# Patient Record
Sex: Female | Born: 1955 | Race: Black or African American | Hispanic: No
Health system: Southern US, Community
[De-identification: ages and names within clinical notes are randomized; demographics above are authoritative.]

## PROBLEM LIST (undated history)

## (undated) VITALS — BP 114/78 | HR 69 | Temp 98.7°F | Resp 15 | Ht 69.75 in | Wt 202.0 lb

## (undated) DIAGNOSIS — M1990 Unspecified osteoarthritis, unspecified site: Secondary | ICD-10-CM

## (undated) DIAGNOSIS — F32A Depression, unspecified: Secondary | ICD-10-CM

## (undated) DIAGNOSIS — F319 Bipolar disorder, unspecified: Secondary | ICD-10-CM

## (undated) DIAGNOSIS — F329 Major depressive disorder, single episode, unspecified: Secondary | ICD-10-CM

## (undated) DIAGNOSIS — F259 Schizoaffective disorder, unspecified: Secondary | ICD-10-CM

## (undated) DIAGNOSIS — F14159 Cocaine abuse with cocaine-induced psychotic disorder, unspecified: Secondary | ICD-10-CM

## (undated) DIAGNOSIS — F419 Anxiety disorder, unspecified: Secondary | ICD-10-CM

## (undated) DIAGNOSIS — F1010 Alcohol abuse, uncomplicated: Secondary | ICD-10-CM

## (undated) HISTORY — PX: FOOT ARTHRODESIS: SHX1655

## (undated) HISTORY — PX: ABDOMINAL HYSTERECTOMY: SHX81

## (undated) HISTORY — PX: ELEVATION OF DEPRESSED SKULL FRACTURE: SUR431

## (undated) HISTORY — PX: APPENDECTOMY: SHX54

---

## 1999-08-22 ENCOUNTER — Emergency Department (HOSPITAL_COMMUNITY): Admission: EM | Admit: 1999-08-22 | Discharge: 1999-08-22 | Payer: Self-pay | Admitting: Emergency Medicine

## 1999-08-22 ENCOUNTER — Encounter: Payer: Self-pay | Admitting: Emergency Medicine

## 1999-10-24 ENCOUNTER — Emergency Department (HOSPITAL_COMMUNITY): Admission: EM | Admit: 1999-10-24 | Discharge: 1999-10-24 | Payer: Self-pay | Admitting: Emergency Medicine

## 2001-06-26 ENCOUNTER — Emergency Department (HOSPITAL_COMMUNITY): Admission: EM | Admit: 2001-06-26 | Discharge: 2001-06-26 | Payer: Self-pay | Admitting: Emergency Medicine

## 2003-10-17 HISTORY — PX: FRACTURE SURGERY: SHX138

## 2005-01-19 ENCOUNTER — Ambulatory Visit: Payer: Self-pay | Admitting: Physical Medicine & Rehabilitation

## 2005-01-19 ENCOUNTER — Inpatient Hospital Stay (HOSPITAL_COMMUNITY): Admission: EM | Admit: 2005-01-19 | Discharge: 2005-02-21 | Payer: Self-pay

## 2005-04-03 ENCOUNTER — Ambulatory Visit (HOSPITAL_COMMUNITY): Payer: Self-pay | Admitting: Psychiatry

## 2005-04-20 ENCOUNTER — Encounter: Admission: RE | Admit: 2005-04-20 | Discharge: 2005-04-20 | Payer: Self-pay | Admitting: Psychiatry

## 2005-05-05 ENCOUNTER — Ambulatory Visit (HOSPITAL_COMMUNITY): Payer: Self-pay | Admitting: Psychiatry

## 2005-06-05 ENCOUNTER — Ambulatory Visit: Payer: Self-pay | Admitting: Internal Medicine

## 2005-07-12 ENCOUNTER — Ambulatory Visit: Payer: Self-pay | Admitting: Internal Medicine

## 2005-07-20 ENCOUNTER — Emergency Department (HOSPITAL_COMMUNITY): Admission: EM | Admit: 2005-07-20 | Discharge: 2005-07-21 | Payer: Self-pay | Admitting: Emergency Medicine

## 2005-11-20 ENCOUNTER — Emergency Department (HOSPITAL_COMMUNITY): Admission: EM | Admit: 2005-11-20 | Discharge: 2005-11-20 | Payer: Self-pay | Admitting: Emergency Medicine

## 2006-09-18 ENCOUNTER — Ambulatory Visit: Payer: Self-pay | Admitting: Internal Medicine

## 2006-12-13 ENCOUNTER — Ambulatory Visit: Payer: Self-pay | Admitting: Internal Medicine

## 2007-10-07 ENCOUNTER — Emergency Department (HOSPITAL_COMMUNITY): Admission: EM | Admit: 2007-10-07 | Discharge: 2007-10-07 | Payer: Self-pay | Admitting: Emergency Medicine

## 2007-11-12 ENCOUNTER — Ambulatory Visit: Payer: Self-pay | Admitting: Internal Medicine

## 2007-11-24 ENCOUNTER — Emergency Department (HOSPITAL_COMMUNITY): Admission: EM | Admit: 2007-11-24 | Discharge: 2007-11-24 | Payer: Self-pay | Admitting: Emergency Medicine

## 2007-12-15 ENCOUNTER — Emergency Department (HOSPITAL_COMMUNITY): Admission: EM | Admit: 2007-12-15 | Discharge: 2007-12-15 | Payer: Self-pay | Admitting: Emergency Medicine

## 2008-01-02 ENCOUNTER — Emergency Department (HOSPITAL_COMMUNITY): Admission: EM | Admit: 2008-01-02 | Discharge: 2008-01-02 | Payer: Self-pay | Admitting: Emergency Medicine

## 2008-01-14 ENCOUNTER — Emergency Department (HOSPITAL_COMMUNITY): Admission: EM | Admit: 2008-01-14 | Discharge: 2008-01-14 | Payer: Self-pay | Admitting: Emergency Medicine

## 2008-02-19 ENCOUNTER — Emergency Department (HOSPITAL_COMMUNITY): Admission: EM | Admit: 2008-02-19 | Discharge: 2008-02-19 | Payer: Self-pay | Admitting: Emergency Medicine

## 2008-03-27 ENCOUNTER — Ambulatory Visit: Payer: Self-pay | Admitting: Internal Medicine

## 2008-05-01 ENCOUNTER — Emergency Department (HOSPITAL_COMMUNITY): Admission: EM | Admit: 2008-05-01 | Discharge: 2008-05-02 | Payer: Self-pay | Admitting: Emergency Medicine

## 2008-08-23 ENCOUNTER — Observation Stay (HOSPITAL_COMMUNITY): Admission: EM | Admit: 2008-08-23 | Discharge: 2008-08-24 | Payer: Self-pay | Admitting: Emergency Medicine

## 2008-08-23 ENCOUNTER — Ambulatory Visit: Payer: Self-pay | Admitting: Internal Medicine

## 2008-09-02 ENCOUNTER — Encounter: Payer: Self-pay | Admitting: Internal Medicine

## 2008-09-02 ENCOUNTER — Ambulatory Visit: Payer: Self-pay | Admitting: Internal Medicine

## 2008-09-02 LAB — CONVERTED CEMR LAB: GC Probe Amp, Genital: NEGATIVE

## 2008-10-18 ENCOUNTER — Emergency Department (HOSPITAL_COMMUNITY): Admission: EM | Admit: 2008-10-18 | Discharge: 2008-10-18 | Payer: Self-pay | Admitting: Emergency Medicine

## 2008-10-23 ENCOUNTER — Ambulatory Visit: Payer: Self-pay | Admitting: Internal Medicine

## 2008-10-23 ENCOUNTER — Encounter (INDEPENDENT_AMBULATORY_CARE_PROVIDER_SITE_OTHER): Payer: Self-pay | Admitting: Internal Medicine

## 2008-10-23 LAB — CONVERTED CEMR LAB: TSH: 0.495 microintl units/mL (ref 0.350–4.50)

## 2008-10-31 ENCOUNTER — Ambulatory Visit: Payer: Self-pay | Admitting: Internal Medicine

## 2008-10-31 ENCOUNTER — Inpatient Hospital Stay (HOSPITAL_COMMUNITY): Admission: EM | Admit: 2008-10-31 | Discharge: 2008-11-10 | Payer: Self-pay | Admitting: Emergency Medicine

## 2008-11-02 ENCOUNTER — Ambulatory Visit: Payer: Self-pay | Admitting: Infectious Diseases

## 2008-11-03 ENCOUNTER — Encounter (INDEPENDENT_AMBULATORY_CARE_PROVIDER_SITE_OTHER): Payer: Self-pay | Admitting: Interventional Radiology

## 2008-11-03 ENCOUNTER — Encounter (INDEPENDENT_AMBULATORY_CARE_PROVIDER_SITE_OTHER): Payer: Self-pay | Admitting: Internal Medicine

## 2008-11-08 ENCOUNTER — Encounter (INDEPENDENT_AMBULATORY_CARE_PROVIDER_SITE_OTHER): Payer: Self-pay | Admitting: Otolaryngology

## 2008-11-10 ENCOUNTER — Ambulatory Visit: Payer: Self-pay | Admitting: Internal Medicine

## 2008-11-17 LAB — CBC WITH DIFFERENTIAL/PLATELET
BASO%: 3.2 % — ABNORMAL HIGH (ref 0.0–2.0)
EOS%: 1.2 % (ref 0.0–7.0)
MCH: 30.5 pg (ref 26.0–34.0)
MCHC: 33.9 g/dL (ref 32.0–36.0)
MCV: 90.1 fL (ref 81.0–101.0)
MONO%: 11 % (ref 0.0–13.0)
RBC: 4.54 10*6/uL (ref 3.70–5.32)
RDW: 14.1 % (ref 11.3–14.5)
lymph#: 1.7 10*3/uL (ref 0.9–3.3)

## 2008-11-17 LAB — COMPREHENSIVE METABOLIC PANEL
ALT: 24 U/L (ref 0–35)
AST: 47 U/L — ABNORMAL HIGH (ref 0–37)
Albumin: 3.2 g/dL — ABNORMAL LOW (ref 3.5–5.2)
Alkaline Phosphatase: 64 U/L (ref 39–117)
BUN: 6 mg/dL (ref 6–23)
Calcium: 8.8 mg/dL (ref 8.4–10.5)
Chloride: 102 mEq/L (ref 96–112)
Potassium: 3.3 mEq/L — ABNORMAL LOW (ref 3.5–5.3)

## 2008-11-18 LAB — RHEUMATOID FACTOR: Rhuematoid fact SerPl-aCnc: <20 IU/mL (ref 0–20)

## 2008-11-18 LAB — HIV ANTIBODY (ROUTINE TESTING W REFLEX)

## 2008-11-24 LAB — CBC WITH DIFFERENTIAL/PLATELET
Basophils Absolute: 0 10*3/uL (ref 0.0–0.1)
Eosinophils Absolute: 0.1 10*3/uL (ref 0.0–0.5)
HGB: 14.3 g/dL (ref 11.6–15.9)
MCV: 89.6 fL (ref 81.0–101.0)
MONO#: 0.7 10*3/uL (ref 0.1–0.9)
NEUT#: 2.4 10*3/uL (ref 1.5–6.5)
RBC: 4.68 10*6/uL (ref 3.70–5.32)
RDW: 14.6 % — ABNORMAL HIGH (ref 11.3–14.5)
WBC: 5.7 10*3/uL (ref 3.9–10.0)

## 2008-12-24 ENCOUNTER — Inpatient Hospital Stay (HOSPITAL_COMMUNITY): Admission: EM | Admit: 2008-12-24 | Discharge: 2009-01-05 | Payer: Self-pay | Admitting: Emergency Medicine

## 2008-12-25 ENCOUNTER — Ambulatory Visit: Payer: Self-pay | Admitting: Oncology

## 2008-12-25 ENCOUNTER — Ambulatory Visit: Payer: Self-pay | Admitting: Internal Medicine

## 2008-12-28 ENCOUNTER — Ambulatory Visit: Payer: Self-pay | Admitting: Gastroenterology

## 2009-02-01 ENCOUNTER — Encounter (INDEPENDENT_AMBULATORY_CARE_PROVIDER_SITE_OTHER): Payer: Self-pay | Admitting: Family Medicine

## 2009-02-01 ENCOUNTER — Ambulatory Visit: Payer: Self-pay | Admitting: Internal Medicine

## 2009-02-01 ENCOUNTER — Inpatient Hospital Stay (HOSPITAL_COMMUNITY): Admission: EM | Admit: 2009-02-01 | Discharge: 2009-02-09 | Payer: Self-pay | Admitting: Emergency Medicine

## 2009-02-01 LAB — CONVERTED CEMR LAB
ALT: 10 units/L (ref 0–35)
Albumin: 3.4 g/dL — ABNORMAL LOW (ref 3.5–5.2)
Basophils Relative: 1 % (ref 0–1)
CO2: 20 meq/L (ref 19–32)
Chloride: 98 meq/L (ref 96–112)
Glucose, Bld: 110 mg/dL — ABNORMAL HIGH (ref 70–99)
Hemoglobin: 14.4 g/dL (ref 12.0–15.0)
Lymphocytes Relative: 86 % — ABNORMAL HIGH (ref 12–46)
Lymphs Abs: 1.2 10*3/uL (ref 0.7–4.0)
MCHC: 36.5 g/dL — ABNORMAL HIGH (ref 30.0–36.0)
Monocytes Absolute: 0.2 10*3/uL (ref 0.1–1.0)
Monocytes Relative: 12 % (ref 3–12)
Neutro Abs: 0 10*3/uL — ABNORMAL LOW (ref 1.7–7.7)
Neutrophils Relative %: 0 % — ABNORMAL LOW (ref 43–77)
Potassium: 3.7 meq/L (ref 3.5–5.3)
RBC: 4.51 M/uL (ref 3.87–5.11)
Sodium: 129 meq/L — ABNORMAL LOW (ref 135–145)
Total Bilirubin: 1.2 mg/dL (ref 0.3–1.2)
Total Protein: 9.2 g/dL — ABNORMAL HIGH (ref 6.0–8.3)
WBC: 1.4 10*3/uL — CL (ref 4.0–10.5)

## 2009-02-03 ENCOUNTER — Ambulatory Visit: Payer: Self-pay | Admitting: Internal Medicine

## 2009-02-18 ENCOUNTER — Ambulatory Visit: Payer: Self-pay | Admitting: Internal Medicine

## 2009-02-28 ENCOUNTER — Inpatient Hospital Stay (HOSPITAL_COMMUNITY): Admission: EM | Admit: 2009-02-28 | Discharge: 2009-03-08 | Payer: Self-pay | Admitting: Emergency Medicine

## 2009-02-28 ENCOUNTER — Ambulatory Visit: Payer: Self-pay | Admitting: Internal Medicine

## 2009-03-09 LAB — CBC WITH DIFFERENTIAL/PLATELET
Basophils Absolute: 0.1 10*3/uL (ref 0.0–0.1)
EOS%: 1.9 % (ref 0.0–7.0)
HGB: 14.7 g/dL (ref 11.6–15.9)
MCH: 29.6 pg (ref 25.1–34.0)
NEUT#: 0.6 10*3/uL — ABNORMAL LOW (ref 1.5–6.5)
RBC: 4.97 10*6/uL (ref 3.70–5.45)
RDW: 18.6 % — ABNORMAL HIGH (ref 11.2–14.5)
lymph#: 2.4 10*3/uL (ref 0.9–3.3)
nRBC: 1 % — ABNORMAL HIGH (ref 0–0)

## 2009-03-11 LAB — COMPREHENSIVE METABOLIC PANEL
ALT: 23 U/L (ref 0–35)
AST: 54 U/L — ABNORMAL HIGH (ref 0–37)
Albumin: 3.2 g/dL — ABNORMAL LOW (ref 3.5–5.2)
Alkaline Phosphatase: 92 U/L (ref 39–117)
Glucose, Bld: 90 mg/dL (ref 70–99)
Potassium: 3.8 mEq/L (ref 3.5–5.3)
Sodium: 137 mEq/L (ref 135–145)
Total Protein: 10.1 g/dL — ABNORMAL HIGH (ref 6.0–8.3)

## 2009-03-11 LAB — KAPPA/LAMBDA LIGHT CHAINS
Kappa free light chain: 12.2 mg/dL — ABNORMAL HIGH (ref 0.33–1.94)
Kappa:Lambda Ratio: 1.65 (ref 0.26–1.65)
Lambda Free Lght Chn: 7.4 mg/dL — ABNORMAL HIGH (ref 0.57–2.63)

## 2009-03-11 LAB — IMMUNOFIXATION ELECTROPHORESIS
IgM, Serum: 371 mg/dL — ABNORMAL HIGH (ref 60–263)
Total Protein, Serum Electrophoresis: 10.1 g/dL — ABNORMAL HIGH (ref 6.0–8.3)

## 2009-03-11 LAB — BETA 2 MICROGLOBULIN, SERUM: Beta-2 Microglobulin: 2.74 mg/L — ABNORMAL HIGH (ref 1.01–1.73)

## 2009-04-01 ENCOUNTER — Inpatient Hospital Stay (HOSPITAL_COMMUNITY): Admission: EM | Admit: 2009-04-01 | Discharge: 2009-04-06 | Payer: Self-pay | Admitting: Emergency Medicine

## 2009-04-01 ENCOUNTER — Ambulatory Visit: Payer: Self-pay | Admitting: Infectious Diseases

## 2009-04-01 ENCOUNTER — Ambulatory Visit: Payer: Self-pay | Admitting: Internal Medicine

## 2009-04-05 ENCOUNTER — Ambulatory Visit: Payer: Self-pay | Admitting: Internal Medicine

## 2009-04-07 LAB — CBC WITH DIFFERENTIAL/PLATELET
Eosinophils Absolute: 0.1 10*3/uL (ref 0.0–0.5)
MONO#: 1.2 10*3/uL — ABNORMAL HIGH (ref 0.1–0.9)
NEUT#: 5.3 10*3/uL (ref 1.5–6.5)
Platelets: 76 10*3/uL — ABNORMAL LOW (ref 145–400)
RBC: 4.46 10*6/uL (ref 3.70–5.45)
RDW: 19.4 % — ABNORMAL HIGH (ref 11.2–14.5)
WBC: 8.6 10*3/uL (ref 3.9–10.3)

## 2009-04-09 LAB — COMPREHENSIVE METABOLIC PANEL
Albumin: 3.1 g/dL — ABNORMAL LOW (ref 3.5–5.2)
CO2: 19 mEq/L (ref 19–32)
Chloride: 107 mEq/L (ref 96–112)
Glucose, Bld: 155 mg/dL — ABNORMAL HIGH (ref 70–99)
Potassium: 3.3 mEq/L — ABNORMAL LOW (ref 3.5–5.3)
Sodium: 138 mEq/L (ref 135–145)
Total Protein: 9 g/dL — ABNORMAL HIGH (ref 6.0–8.3)

## 2009-04-09 LAB — LACTATE DEHYDROGENASE: LDH: 336 U/L — ABNORMAL HIGH (ref 94–250)

## 2009-04-09 LAB — KAPPA/LAMBDA LIGHT CHAINS
Kappa:Lambda Ratio: 1.51 (ref 0.26–1.65)
Lambda Free Lght Chn: 6.15 mg/dL — ABNORMAL HIGH (ref 0.57–2.63)

## 2009-04-09 LAB — IMMUNOFIXATION ELECTROPHORESIS: Total Protein, Serum Electrophoresis: 9 g/dL — ABNORMAL HIGH (ref 6.0–8.3)

## 2009-04-28 ENCOUNTER — Inpatient Hospital Stay (HOSPITAL_COMMUNITY): Admission: EM | Admit: 2009-04-28 | Discharge: 2009-04-30 | Payer: Self-pay | Admitting: Emergency Medicine

## 2009-05-07 ENCOUNTER — Encounter: Payer: Self-pay | Admitting: Internal Medicine

## 2009-05-21 ENCOUNTER — Inpatient Hospital Stay (HOSPITAL_COMMUNITY): Admission: EM | Admit: 2009-05-21 | Discharge: 2009-05-28 | Payer: Self-pay | Admitting: Emergency Medicine

## 2009-05-22 ENCOUNTER — Ambulatory Visit: Payer: Self-pay | Admitting: Internal Medicine

## 2009-05-25 ENCOUNTER — Ambulatory Visit: Payer: Self-pay | Admitting: Internal Medicine

## 2009-08-02 ENCOUNTER — Emergency Department (HOSPITAL_COMMUNITY): Admission: EM | Admit: 2009-08-02 | Discharge: 2009-08-02 | Payer: Self-pay | Admitting: Emergency Medicine

## 2009-08-24 ENCOUNTER — Emergency Department (HOSPITAL_COMMUNITY): Admission: EM | Admit: 2009-08-24 | Discharge: 2009-08-24 | Payer: Self-pay | Admitting: Emergency Medicine

## 2009-08-27 ENCOUNTER — Emergency Department (HOSPITAL_COMMUNITY): Admission: EM | Admit: 2009-08-27 | Discharge: 2009-08-28 | Payer: Self-pay | Admitting: Emergency Medicine

## 2009-10-19 ENCOUNTER — Emergency Department (HOSPITAL_COMMUNITY): Admission: EM | Admit: 2009-10-19 | Discharge: 2009-10-19 | Payer: Self-pay | Admitting: Emergency Medicine

## 2009-10-26 ENCOUNTER — Emergency Department (HOSPITAL_COMMUNITY): Admission: EM | Admit: 2009-10-26 | Discharge: 2009-10-26 | Payer: Self-pay | Admitting: Emergency Medicine

## 2009-12-16 ENCOUNTER — Emergency Department (HOSPITAL_COMMUNITY): Admission: EM | Admit: 2009-12-16 | Discharge: 2009-12-16 | Payer: Self-pay | Admitting: Family Medicine

## 2009-12-16 ENCOUNTER — Emergency Department (HOSPITAL_COMMUNITY): Admission: EM | Admit: 2009-12-16 | Discharge: 2009-12-16 | Payer: Self-pay | Admitting: Emergency Medicine

## 2010-08-08 ENCOUNTER — Emergency Department (HOSPITAL_COMMUNITY): Admission: EM | Admit: 2010-08-08 | Discharge: 2010-08-08 | Payer: Self-pay | Admitting: Emergency Medicine

## 2010-08-28 ENCOUNTER — Emergency Department (HOSPITAL_COMMUNITY)
Admission: EM | Admit: 2010-08-28 | Discharge: 2010-08-28 | Payer: Self-pay | Source: Home / Self Care | Admitting: Emergency Medicine

## 2010-11-16 ENCOUNTER — Other Ambulatory Visit: Payer: Self-pay | Admitting: Emergency Medicine

## 2010-11-16 ENCOUNTER — Inpatient Hospital Stay (INDEPENDENT_AMBULATORY_CARE_PROVIDER_SITE_OTHER)
Admission: RE | Admit: 2010-11-16 | Discharge: 2010-11-16 | Disposition: A | Discharge status: Home / Self Care | Payer: PRIVATE HEALTH INSURANCE | Source: Ambulatory Visit | Attending: Emergency Medicine | Admitting: Emergency Medicine

## 2010-11-16 ENCOUNTER — Other Ambulatory Visit: Payer: Self-pay

## 2010-11-16 DIAGNOSIS — L989 Disorder of the skin and subcutaneous tissue, unspecified: Secondary | ICD-10-CM

## 2010-11-16 DIAGNOSIS — N760 Acute vaginitis: Secondary | ICD-10-CM

## 2010-11-16 DIAGNOSIS — J069 Acute upper respiratory infection, unspecified: Secondary | ICD-10-CM

## 2010-11-16 LAB — WET PREP, GENITAL
Trich, Wet Prep: NONE SEEN
Yeast Wet Prep HPF POC: NONE SEEN

## 2010-11-17 LAB — GC/CHLAMYDIA PROBE AMP, GENITAL: GC Probe Amp, Genital: NEGATIVE

## 2010-11-17 LAB — POCT URINALYSIS DIPSTICK
Bilirubin Urine: NEGATIVE
Ketones, ur: NEGATIVE mg/dL
Protein, ur: NEGATIVE mg/dL
Specific Gravity, Urine: 1.01 (ref 1.005–1.030)
pH: 7 (ref 5.0–8.0)

## 2011-01-01 LAB — CBC
HCT: 38.3 % (ref 36.0–46.0)
Hemoglobin: 13.4 g/dL (ref 12.0–15.0)
MCV: 92.3 fL (ref 78.0–100.0)
RDW: 12.5 % (ref 11.5–15.5)
WBC: 4.9 10*3/uL (ref 4.0–10.5)

## 2011-01-01 LAB — DIFFERENTIAL
Band Neutrophils: 0 % (ref 0–10)
Eosinophils Absolute: 0.1 10*3/uL (ref 0.0–0.7)
Eosinophils Relative: 2 % (ref 0–5)
Metamyelocytes Relative: 0 %
Monocytes Absolute: 0.7 10*3/uL (ref 0.1–1.0)
Monocytes Relative: 14 % — ABNORMAL HIGH (ref 3–12)
Myelocytes: 0 %

## 2011-01-08 LAB — POCT URINALYSIS DIP (DEVICE)
Bilirubin Urine: NEGATIVE
Glucose, UA: NEGATIVE mg/dL
Nitrite: NEGATIVE
Urobilinogen, UA: 1 mg/dL (ref 0.0–1.0)

## 2011-01-08 LAB — GC/CHLAMYDIA PROBE AMP, GENITAL
Chlamydia, DNA Probe: NEGATIVE
GC Probe Amp, Genital: NEGATIVE

## 2011-01-19 LAB — URINE MICROSCOPIC-ADD ON

## 2011-01-19 LAB — POCT I-STAT, CHEM 8
Chloride: 106 mEq/L (ref 96–112)
Creatinine, Ser: 0.4 mg/dL (ref 0.4–1.2)
Glucose, Bld: 129 mg/dL — ABNORMAL HIGH (ref 70–99)
Potassium: 3.9 mEq/L (ref 3.5–5.1)

## 2011-01-19 LAB — DIFFERENTIAL
Eosinophils Absolute: 0 10*3/uL (ref 0.0–0.7)
Lymphs Abs: 1.1 10*3/uL (ref 0.7–4.0)
Monocytes Relative: 9 % (ref 3–12)
Neutro Abs: 3.7 10*3/uL (ref 1.7–7.7)
Neutrophils Relative %: 69 % (ref 43–77)

## 2011-01-19 LAB — WET PREP, GENITAL: Clue Cells Wet Prep HPF POC: NONE SEEN

## 2011-01-19 LAB — CBC
Hemoglobin: 13.5 g/dL (ref 12.0–15.0)
MCV: 92.3 fL (ref 78.0–100.0)
RBC: 4.18 MIL/uL (ref 3.87–5.11)
WBC: 5.3 10*3/uL (ref 4.0–10.5)

## 2011-01-19 LAB — URINALYSIS, ROUTINE W REFLEX MICROSCOPIC
Glucose, UA: NEGATIVE mg/dL
Protein, ur: NEGATIVE mg/dL
Specific Gravity, Urine: 1.022 (ref 1.005–1.030)
pH: 5.5 (ref 5.0–8.0)

## 2011-01-19 LAB — URINE CULTURE

## 2011-01-21 LAB — URINALYSIS, ROUTINE W REFLEX MICROSCOPIC
Ketones, ur: NEGATIVE mg/dL
Nitrite: NEGATIVE
Urobilinogen, UA: 1 mg/dL (ref 0.0–1.0)
pH: 5.5 (ref 5.0–8.0)

## 2011-01-21 LAB — CBC
HCT: 42.9 % (ref 36.0–46.0)
Hemoglobin: 13.6 g/dL (ref 12.0–15.0)
Hemoglobin: 14.5 g/dL (ref 12.0–15.0)
Hemoglobin: 15.5 g/dL — ABNORMAL HIGH (ref 12.0–15.0)
Hemoglobin: 15.8 g/dL — ABNORMAL HIGH (ref 12.0–15.0)
MCHC: 33.9 g/dL (ref 30.0–36.0)
MCHC: 34.2 g/dL (ref 30.0–36.0)
MCHC: 34.3 g/dL (ref 30.0–36.0)
MCHC: 34.6 g/dL (ref 30.0–36.0)
MCHC: 34.7 g/dL (ref 30.0–36.0)
MCV: 90.4 fL (ref 78.0–100.0)
MCV: 90.7 fL (ref 78.0–100.0)
MCV: 91 fL (ref 78.0–100.0)
MCV: 91.5 fL (ref 78.0–100.0)
Platelets: 113 10*3/uL — ABNORMAL LOW (ref 150–400)
Platelets: 114 10*3/uL — ABNORMAL LOW (ref 150–400)
Platelets: 128 10*3/uL — ABNORMAL LOW (ref 150–400)
RBC: 4.64 MIL/uL (ref 3.87–5.11)
RBC: 4.68 MIL/uL (ref 3.87–5.11)
RBC: 5.09 MIL/uL (ref 3.87–5.11)
RDW: 16.5 % — ABNORMAL HIGH (ref 11.5–15.5)
RDW: 16.6 % — ABNORMAL HIGH (ref 11.5–15.5)
RDW: 17 % — ABNORMAL HIGH (ref 11.5–15.5)
RDW: 17.1 % — ABNORMAL HIGH (ref 11.5–15.5)
WBC: 2 10*3/uL — ABNORMAL LOW (ref 4.0–10.5)

## 2011-01-21 LAB — CULTURE, BLOOD (ROUTINE X 2): Culture: NO GROWTH

## 2011-01-21 LAB — COMPREHENSIVE METABOLIC PANEL
ALT: 35 U/L (ref 0–35)
AST: 51 U/L — ABNORMAL HIGH (ref 0–37)
Albumin: 3 g/dL — ABNORMAL LOW (ref 3.5–5.2)
Alkaline Phosphatase: 113 U/L (ref 39–117)
CO2: 23 mEq/L (ref 19–32)
Calcium: 8.7 mg/dL (ref 8.4–10.5)
Creatinine, Ser: 0.6 mg/dL (ref 0.4–1.2)
GFR calc Af Amer: >60 mL/min (ref >60)
GFR calc non Af Amer: >60 mL/min (ref >60)
Glucose, Bld: 109 mg/dL — ABNORMAL HIGH (ref 70–99)
Potassium: 3.9 mEq/L (ref 3.5–5.1)
Sodium: 138 mEq/L (ref 135–145)
Total Protein: 9 g/dL — ABNORMAL HIGH (ref 6.0–8.3)

## 2011-01-21 LAB — DIFFERENTIAL
Basophils Absolute: 0 10*3/uL (ref 0.0–0.1)
Basophils Absolute: 0 10*3/uL (ref 0.0–0.1)
Basophils Absolute: 0 10*3/uL (ref 0.0–0.1)
Basophils Absolute: 0 10*3/uL (ref 0.0–0.1)
Basophils Absolute: 0.1 10*3/uL (ref 0.0–0.1)
Basophils Relative: 0 % (ref 0–1)
Basophils Relative: 0 % (ref 0–1)
Basophils Relative: 1 % (ref 0–1)
Basophils Relative: 1 % (ref 0–1)
Eosinophils Absolute: 0 10*3/uL (ref 0.0–0.7)
Eosinophils Absolute: 0 10*3/uL (ref 0.0–0.7)
Eosinophils Absolute: 0.1 10*3/uL (ref 0.0–0.7)
Eosinophils Absolute: 0.1 10*3/uL (ref 0.0–0.7)
Eosinophils Absolute: 0.1 10*3/uL (ref 0.0–0.7)
Lymphocytes Relative: 12 % (ref 12–46)
Lymphocytes Relative: 34 % (ref 12–46)
Lymphocytes Relative: 42 % (ref 12–46)
Lymphocytes Relative: 51 % — ABNORMAL HIGH (ref 12–46)
Lymphocytes Relative: 56 % — ABNORMAL HIGH (ref 12–46)
Lymphs Abs: 0.5 10*3/uL — ABNORMAL LOW (ref 0.7–4.0)
Lymphs Abs: 0.8 10*3/uL (ref 0.7–4.0)
Monocytes Absolute: 0 10*3/uL — ABNORMAL LOW (ref 0.1–1.0)
Monocytes Absolute: 0.3 10*3/uL (ref 0.1–1.0)
Monocytes Absolute: 0.9 10*3/uL (ref 0.1–1.0)
Monocytes Relative: 2 % — ABNORMAL LOW (ref 3–12)
Monocytes Relative: 25 % — ABNORMAL HIGH (ref 3–12)
Monocytes Relative: 5 % (ref 3–12)
Neutro Abs: 0.7 10*3/uL — ABNORMAL LOW (ref 1.7–7.7)
Neutro Abs: 9.3 10*3/uL — ABNORMAL HIGH (ref 1.7–7.7)
Neutrophils Relative %: 24 % — ABNORMAL LOW (ref 43–77)
Neutrophils Relative %: 48 % (ref 43–77)
Neutrophils Relative %: 56 % (ref 43–77)
Neutrophils Relative %: 76 % (ref 43–77)
Neutrophils Relative %: 79 % — ABNORMAL HIGH (ref 43–77)
Neutrophils Relative %: 85 % — ABNORMAL HIGH (ref 43–77)

## 2011-01-21 LAB — BASIC METABOLIC PANEL
BUN: 12 mg/dL (ref 6–23)
Calcium: 8.7 mg/dL (ref 8.4–10.5)
Calcium: 9 mg/dL (ref 8.4–10.5)
Chloride: 108 mEq/L (ref 96–112)
Creatinine, Ser: 0.5 mg/dL (ref 0.4–1.2)
GFR calc Af Amer: >60 mL/min (ref >60)
GFR calc Af Amer: >60 mL/min (ref >60)
GFR calc non Af Amer: >60 mL/min (ref >60)
GFR calc non Af Amer: >60 mL/min (ref >60)
GFR calc non Af Amer: >60 mL/min (ref >60)
Glucose, Bld: 104 mg/dL — ABNORMAL HIGH (ref 70–99)
Glucose, Bld: 125 mg/dL — ABNORMAL HIGH (ref 70–99)
Potassium: 3.4 mEq/L — ABNORMAL LOW (ref 3.5–5.1)
Sodium: 134 mEq/L — ABNORMAL LOW (ref 135–145)
Sodium: 136 mEq/L (ref 135–145)
Sodium: 137 mEq/L (ref 135–145)

## 2011-01-21 LAB — RAPID URINE DRUG SCREEN, HOSP PERFORMED
Amphetamines: NOT DETECTED
Benzodiazepines: NOT DETECTED
Cocaine: POSITIVE — AB
Opiates: NOT DETECTED
Tetrahydrocannabinol: POSITIVE — AB

## 2011-01-21 LAB — CARDIAC PANEL(CRET KIN+CKTOT+MB+TROPI)
CK, MB: 1.4 ng/mL (ref 0.3–4.0)
Relative Index: INVALID (ref 0.0–2.5)
Relative Index: INVALID (ref 0.0–2.5)
Total CK: 100 U/L (ref 7–177)
Total CK: 95 U/L (ref 7–177)
Troponin I: 0.01 ng/mL (ref 0.00–0.06)
Troponin I: 0.01 ng/mL (ref 0.00–0.06)
Troponin I: 0.02 ng/mL (ref 0.00–0.06)

## 2011-01-21 LAB — CK TOTAL AND CKMB (NOT AT ARMC)
CK, MB: 15.9 ng/mL — ABNORMAL HIGH (ref 0.3–4.0)
Relative Index: 1.8 (ref 0.0–2.5)
Total CK: 907 U/L — ABNORMAL HIGH (ref 7–177)

## 2011-01-21 LAB — URINE CULTURE: Colony Count: 35000

## 2011-01-21 LAB — ETHANOL: Alcohol, Ethyl (B): <5 mg/dL (ref 0–10)

## 2011-01-21 LAB — HCV RNA QUANT
HCV Quantitative Log: 7.27 {Log} — ABNORMAL HIGH (ref <1.63)
HCV Quantitative: 18460000 IU/mL — ABNORMAL HIGH (ref <43)

## 2011-01-21 LAB — SEDIMENTATION RATE: Sed Rate: 44 mm/hr — ABNORMAL HIGH (ref 0–22)

## 2011-01-21 LAB — EXPECTORATED SPUTUM ASSESSMENT W GRAM STAIN, RFLX TO RESP C

## 2011-01-21 LAB — URINE MICROSCOPIC-ADD ON

## 2011-01-21 LAB — POCT PREGNANCY, URINE: Preg Test, Ur: NEGATIVE

## 2011-01-21 LAB — CK: Total CK: 505 U/L — ABNORMAL HIGH (ref 7–177)

## 2011-01-21 LAB — APTT: aPTT: 39 seconds — ABNORMAL HIGH (ref 24–37)

## 2011-01-21 LAB — PATHOLOGIST SMEAR REVIEW

## 2011-01-22 LAB — BASIC METABOLIC PANEL WITH GFR
BUN: 1 mg/dL — ABNORMAL LOW (ref 6–23)
CO2: 24 meq/L (ref 19–32)
Calcium: 8.5 mg/dL (ref 8.4–10.5)
Chloride: 109 meq/L (ref 96–112)
Creatinine, Ser: 0.51 mg/dL (ref 0.4–1.2)
GFR calc non Af Amer: >60 mL/min
Glucose, Bld: 93 mg/dL (ref 70–99)
Potassium: 3.9 meq/L (ref 3.5–5.1)
Sodium: 137 meq/L (ref 135–145)

## 2011-01-22 LAB — CBC
HCT: 40.8 % (ref 36.0–46.0)
Hemoglobin: 13.8 g/dL (ref 12.0–15.0)
MCV: 90.8 fL (ref 78.0–100.0)
RBC: 4.49 MIL/uL (ref 3.87–5.11)
WBC: 2.1 10*3/uL — ABNORMAL LOW (ref 4.0–10.5)

## 2011-01-23 LAB — CBC
HCT: 39.3 % (ref 36.0–46.0)
HCT: 39.8 % (ref 36.0–46.0)
HCT: 40 % (ref 36.0–46.0)
HCT: 41.3 % (ref 36.0–46.0)
HCT: 41.4 % (ref 36.0–46.0)
HCT: 42.9 % (ref 36.0–46.0)
Hemoglobin: 13 g/dL (ref 12.0–15.0)
Hemoglobin: 13.3 g/dL (ref 12.0–15.0)
Hemoglobin: 13.6 g/dL (ref 12.0–15.0)
Hemoglobin: 14.7 g/dL (ref 12.0–15.0)
MCHC: 34.3 g/dL (ref 30.0–36.0)
MCHC: 34.4 g/dL (ref 30.0–36.0)
MCHC: 34.5 g/dL (ref 30.0–36.0)
MCHC: 34.5 g/dL (ref 30.0–36.0)
MCV: 88.9 fL (ref 78.0–100.0)
MCV: 89.2 fL (ref 78.0–100.0)
MCV: 89.7 fL (ref 78.0–100.0)
MCV: 90.8 fL (ref 78.0–100.0)
MCV: 90.9 fL (ref 78.0–100.0)
Platelets: 69 10*3/uL — ABNORMAL LOW (ref 150–400)
Platelets: 92 10*3/uL — ABNORMAL LOW (ref 150–400)
Platelets: 96 10*3/uL — ABNORMAL LOW (ref 150–400)
Platelets: DECREASED 10*3/uL (ref 150–400)
RBC: 4.41 MIL/uL (ref 3.87–5.11)
RBC: 4.43 MIL/uL (ref 3.87–5.11)
RBC: 4.74 MIL/uL (ref 3.87–5.11)
RDW: 18.5 % — ABNORMAL HIGH (ref 11.5–15.5)
RDW: 18.7 % — ABNORMAL HIGH (ref 11.5–15.5)
RDW: 19.3 % — ABNORMAL HIGH (ref 11.5–15.5)
RDW: 19.5 % — ABNORMAL HIGH (ref 11.5–15.5)
WBC: 2.1 10*3/uL — ABNORMAL LOW (ref 4.0–10.5)
WBC: 2.4 10*3/uL — ABNORMAL LOW (ref 4.0–10.5)
WBC: 4.7 10*3/uL (ref 4.0–10.5)

## 2011-01-23 LAB — CARDIAC PANEL(CRET KIN+CKTOT+MB+TROPI)
CK, MB: 2.4 ng/mL (ref 0.3–4.0)
CK, MB: 3.3 ng/mL (ref 0.3–4.0)
CK, MB: 7.6 ng/mL — ABNORMAL HIGH (ref 0.3–4.0)
Relative Index: 1.5 (ref 0.0–2.5)
Relative Index: 2.4 (ref 0.0–2.5)
Total CK: 176 U/L (ref 7–177)
Troponin I: 0.01 ng/mL (ref 0.00–0.06)
Troponin I: 0.01 ng/mL (ref 0.00–0.06)

## 2011-01-23 LAB — BASIC METABOLIC PANEL
BUN: 1 mg/dL — ABNORMAL LOW (ref 6–23)
BUN: 2 mg/dL — ABNORMAL LOW (ref 6–23)
BUN: 3 mg/dL — ABNORMAL LOW (ref 6–23)
BUN: 6 mg/dL (ref 6–23)
CO2: 20 mEq/L (ref 19–32)
CO2: 21 mEq/L (ref 19–32)
CO2: 24 mEq/L (ref 19–32)
CO2: 26 mEq/L (ref 19–32)
Calcium: 8.5 mg/dL (ref 8.4–10.5)
Chloride: 102 mEq/L (ref 96–112)
Chloride: 105 mEq/L (ref 96–112)
Chloride: 106 mEq/L (ref 96–112)
Chloride: 108 mEq/L (ref 96–112)
Chloride: 108 mEq/L (ref 96–112)
Chloride: 109 mEq/L (ref 96–112)
Creatinine, Ser: 0.51 mg/dL (ref 0.4–1.2)
Creatinine, Ser: 0.51 mg/dL (ref 0.4–1.2)
GFR calc Af Amer: >60 mL/min (ref >60)
GFR calc Af Amer: >60 mL/min (ref >60)
GFR calc non Af Amer: >60 mL/min (ref >60)
GFR calc non Af Amer: >60 mL/min (ref >60)
GFR calc non Af Amer: >60 mL/min (ref >60)
GFR calc non Af Amer: >60 mL/min (ref >60)
GFR calc non Af Amer: >60 mL/min (ref >60)
Glucose, Bld: 101 mg/dL — ABNORMAL HIGH (ref 70–99)
Glucose, Bld: 103 mg/dL — ABNORMAL HIGH (ref 70–99)
Glucose, Bld: 104 mg/dL — ABNORMAL HIGH (ref 70–99)
Glucose, Bld: 117 mg/dL — ABNORMAL HIGH (ref 70–99)
Potassium: 2.8 mEq/L — ABNORMAL LOW (ref 3.5–5.1)
Potassium: 2.8 mEq/L — ABNORMAL LOW (ref 3.5–5.1)
Potassium: 3 mEq/L — ABNORMAL LOW (ref 3.5–5.1)
Potassium: 3.1 mEq/L — ABNORMAL LOW (ref 3.5–5.1)
Potassium: 3.4 mEq/L — ABNORMAL LOW (ref 3.5–5.1)
Potassium: 4 mEq/L (ref 3.5–5.1)
Sodium: 129 mEq/L — ABNORMAL LOW (ref 135–145)
Sodium: 134 mEq/L — ABNORMAL LOW (ref 135–145)
Sodium: 136 mEq/L (ref 135–145)
Sodium: 137 mEq/L (ref 135–145)
Sodium: 137 mEq/L (ref 135–145)

## 2011-01-23 LAB — DIFFERENTIAL
Band Neutrophils: 0 % (ref 0–10)
Band Neutrophils: 3 % (ref 0–10)
Basophils Absolute: 0 10*3/uL (ref 0.0–0.1)
Basophils Absolute: 0 10*3/uL (ref 0.0–0.1)
Basophils Absolute: 0 10*3/uL (ref 0.0–0.1)
Basophils Relative: 1 % (ref 0–1)
Blasts: 0 %
Blasts: 0 %
Eosinophils Absolute: 0.2 10*3/uL (ref 0.0–0.7)
Eosinophils Relative: 1 % (ref 0–5)
Eosinophils Relative: 2 % (ref 0–5)
Eosinophils Relative: 4 % (ref 0–5)
Eosinophils Relative: 6 % — ABNORMAL HIGH (ref 0–5)
Lymphocytes Relative: 29 % (ref 12–46)
Lymphocytes Relative: 47 % — ABNORMAL HIGH (ref 12–46)
Lymphs Abs: 0.9 10*3/uL (ref 0.7–4.0)
Lymphs Abs: 1.1 10*3/uL (ref 0.7–4.0)
Lymphs Abs: 1.2 10*3/uL (ref 0.7–4.0)
Lymphs Abs: 1.4 10*3/uL (ref 0.7–4.0)
Lymphs Abs: 1.8 10*3/uL (ref 0.7–4.0)
Metamyelocytes Relative: 0 %
Monocytes Absolute: 0.4 10*3/uL (ref 0.1–1.0)
Monocytes Absolute: 0.8 10*3/uL (ref 0.1–1.0)
Monocytes Absolute: 1 10*3/uL (ref 0.1–1.0)
Monocytes Absolute: 1.1 10*3/uL — ABNORMAL HIGH (ref 0.1–1.0)
Monocytes Absolute: 1.5 10*3/uL — ABNORMAL HIGH (ref 0.1–1.0)
Monocytes Relative: 31 % — ABNORMAL HIGH (ref 3–12)
Monocytes Relative: 38 % — ABNORMAL HIGH (ref 3–12)
Monocytes Relative: 39 % — ABNORMAL HIGH (ref 3–12)
Monocytes Relative: 41 % — ABNORMAL HIGH (ref 3–12)
Monocytes Relative: 42 % — ABNORMAL HIGH (ref 3–12)
Monocytes Relative: 42 % — ABNORMAL HIGH (ref 3–12)
Myelocytes: 0 %
Neutro Abs: 0 10*3/uL — ABNORMAL LOW (ref 1.7–7.7)
Neutro Abs: 0.1 10*3/uL — ABNORMAL LOW (ref 1.7–7.7)
Neutro Abs: 0.3 10*3/uL — ABNORMAL LOW (ref 1.7–7.7)
Neutro Abs: 1.8 10*3/uL (ref 1.7–7.7)
Neutrophils Relative %: 1 % — ABNORMAL LOW (ref 43–77)
Neutrophils Relative %: 2 % — ABNORMAL LOW (ref 43–77)
Neutrophils Relative %: 3 % — ABNORMAL LOW (ref 43–77)
Promyelocytes Absolute: 0 %
nRBC: 0 /100 WBC

## 2011-01-23 LAB — URINALYSIS, ROUTINE W REFLEX MICROSCOPIC
Leukocytes, UA: NEGATIVE
Nitrite: NEGATIVE
Specific Gravity, Urine: 1.003 — ABNORMAL LOW (ref 1.005–1.030)
Urobilinogen, UA: 1 mg/dL (ref 0.0–1.0)

## 2011-01-23 LAB — COMPREHENSIVE METABOLIC PANEL
AST: 60 U/L — ABNORMAL HIGH (ref 0–37)
CO2: 21 mEq/L (ref 19–32)
Calcium: 8.8 mg/dL (ref 8.4–10.5)
Creatinine, Ser: 0.68 mg/dL (ref 0.4–1.2)
GFR calc Af Amer: >60 mL/min (ref >60)
GFR calc non Af Amer: >60 mL/min (ref >60)

## 2011-01-23 LAB — PATHOLOGIST SMEAR REVIEW

## 2011-01-23 LAB — POCT PREGNANCY, URINE: Preg Test, Ur: NEGATIVE

## 2011-01-23 LAB — TROPONIN I: Troponin I: <0.01 ng/mL (ref 0.00–0.06)

## 2011-01-23 LAB — RAPID URINE DRUG SCREEN, HOSP PERFORMED
Barbiturates: NOT DETECTED
Benzodiazepines: NOT DETECTED
Opiates: NOT DETECTED
Tetrahydrocannabinol: NOT DETECTED

## 2011-01-23 LAB — URINE MICROSCOPIC-ADD ON

## 2011-01-23 LAB — WET PREP, GENITAL
Clue Cells Wet Prep HPF POC: NONE SEEN
Trich, Wet Prep: NONE SEEN
Yeast Wet Prep HPF POC: NONE SEEN

## 2011-01-23 LAB — CULTURE, BLOOD (ROUTINE X 2): Culture: NO GROWTH

## 2011-01-23 LAB — PROTIME-INR: Prothrombin Time: 13.8 seconds (ref 11.6–15.2)

## 2011-01-23 LAB — POCT CARDIAC MARKERS
Myoglobin, poc: 183 ng/mL (ref 12–200)
Troponin i, poc: <0.05 ng/mL (ref 0.00–0.09)

## 2011-01-23 LAB — RAPID STREP SCREEN (MED CTR MEBANE ONLY): Streptococcus, Group A Screen (Direct): NEGATIVE

## 2011-01-23 LAB — GC/CHLAMYDIA PROBE AMP, URINE: GC Probe Amp, Urine: NEGATIVE

## 2011-01-23 LAB — EXPECTORATED SPUTUM ASSESSMENT W GRAM STAIN, RFLX TO RESP C

## 2011-01-23 LAB — LACTIC ACID, PLASMA: Lactic Acid, Venous: 1.3 mmol/L (ref 0.5–2.2)

## 2011-01-23 LAB — GIARDIA/CRYPTOSPORIDIUM SCREEN(EIA): Cryptosporidium Screen (EIA): NEGATIVE

## 2011-01-23 LAB — MAGNESIUM
Magnesium: 1.7 mg/dL (ref 1.5–2.5)
Magnesium: 1.7 mg/dL (ref 1.5–2.5)

## 2011-01-23 LAB — APTT: aPTT: 33 seconds (ref 24–37)

## 2011-01-23 LAB — CK TOTAL AND CKMB (NOT AT ARMC): Total CK: 832 U/L — ABNORMAL HIGH (ref 7–177)

## 2011-01-23 LAB — TSH: TSH: 0.646 u[IU]/mL (ref 0.350–4.500)

## 2011-01-24 LAB — BASIC METABOLIC PANEL
BUN: 1 mg/dL — ABNORMAL LOW (ref 6–23)
BUN: 1 mg/dL — ABNORMAL LOW (ref 6–23)
BUN: 1 mg/dL — ABNORMAL LOW (ref 6–23)
BUN: 2 mg/dL — ABNORMAL LOW (ref 6–23)
BUN: 3 mg/dL — ABNORMAL LOW (ref 6–23)
CO2: 22 mEq/L (ref 19–32)
CO2: 23 mEq/L (ref 19–32)
CO2: 24 mEq/L (ref 19–32)
CO2: 26 mEq/L (ref 19–32)
Calcium: 8.2 mg/dL — ABNORMAL LOW (ref 8.4–10.5)
Calcium: 8.6 mg/dL (ref 8.4–10.5)
Calcium: 8.6 mg/dL (ref 8.4–10.5)
Calcium: 8.8 mg/dL (ref 8.4–10.5)
Chloride: 100 mEq/L (ref 96–112)
Chloride: 109 mEq/L (ref 96–112)
Chloride: 111 mEq/L (ref 96–112)
Creatinine, Ser: 0.44 mg/dL (ref 0.4–1.2)
Creatinine, Ser: 0.45 mg/dL (ref 0.4–1.2)
Creatinine, Ser: 0.57 mg/dL (ref 0.4–1.2)
Creatinine, Ser: 0.59 mg/dL (ref 0.4–1.2)
GFR calc Af Amer: >60 mL/min (ref >60)
GFR calc Af Amer: >60 mL/min (ref >60)
GFR calc Af Amer: >60 mL/min (ref >60)
GFR calc non Af Amer: >60 mL/min (ref >60)
GFR calc non Af Amer: >60 mL/min (ref >60)
GFR calc non Af Amer: >60 mL/min (ref >60)
Glucose, Bld: 150 mg/dL — ABNORMAL HIGH (ref 70–99)
Glucose, Bld: 87 mg/dL (ref 70–99)
Glucose, Bld: 94 mg/dL (ref 70–99)
Potassium: 3.3 mEq/L — ABNORMAL LOW (ref 3.5–5.1)
Potassium: 3.6 mEq/L (ref 3.5–5.1)
Sodium: 135 mEq/L (ref 135–145)
Sodium: 143 mEq/L (ref 135–145)

## 2011-01-24 LAB — RAPID URINE DRUG SCREEN, HOSP PERFORMED
Barbiturates: NOT DETECTED
Cocaine: POSITIVE — AB
Opiates: POSITIVE — AB

## 2011-01-24 LAB — CBC
HCT: 35.4 % — ABNORMAL LOW (ref 36.0–46.0)
HCT: 37.4 % (ref 36.0–46.0)
HCT: 39.3 % (ref 36.0–46.0)
Hemoglobin: 12.5 g/dL (ref 12.0–15.0)
Hemoglobin: 13.3 g/dL (ref 12.0–15.0)
Hemoglobin: 13.4 g/dL (ref 12.0–15.0)
Hemoglobin: 13.4 g/dL (ref 12.0–15.0)
MCHC: 34.1 g/dL (ref 30.0–36.0)
MCHC: 34.7 g/dL (ref 30.0–36.0)
MCHC: 34.8 g/dL (ref 30.0–36.0)
MCHC: 35.4 g/dL (ref 30.0–36.0)
MCHC: 35.8 g/dL (ref 30.0–36.0)
MCV: 88.2 fL (ref 78.0–100.0)
MCV: 88.7 fL (ref 78.0–100.0)
MCV: 89.4 fL (ref 78.0–100.0)
MCV: 91.6 fL (ref 78.0–100.0)
Platelets: 195 10*3/uL (ref 150–400)
Platelets: 207 10*3/uL (ref 150–400)
Platelets: 217 10*3/uL (ref 150–400)
Platelets: 218 10*3/uL (ref 150–400)
Platelets: 241 10*3/uL (ref 150–400)
RBC: 3.92 MIL/uL (ref 3.87–5.11)
RBC: 4.09 MIL/uL (ref 3.87–5.11)
RBC: 4.55 MIL/uL (ref 3.87–5.11)
RDW: 18.9 % — ABNORMAL HIGH (ref 11.5–15.5)
RDW: 19.5 % — ABNORMAL HIGH (ref 11.5–15.5)
RDW: 19.5 % — ABNORMAL HIGH (ref 11.5–15.5)
RDW: 19.6 % — ABNORMAL HIGH (ref 11.5–15.5)
WBC: 2.8 10*3/uL — ABNORMAL LOW (ref 4.0–10.5)
WBC: 3.6 10*3/uL — ABNORMAL LOW (ref 4.0–10.5)
WBC: 4 10*3/uL (ref 4.0–10.5)
WBC: 4.8 10*3/uL (ref 4.0–10.5)

## 2011-01-24 LAB — DIFFERENTIAL
Band Neutrophils: 0 % (ref 0–10)
Basophils Absolute: 0 10*3/uL (ref 0.0–0.1)
Basophils Absolute: 0 10*3/uL (ref 0.0–0.1)
Basophils Absolute: 0 10*3/uL (ref 0.0–0.1)
Basophils Absolute: 0 10*3/uL (ref 0.0–0.1)
Basophils Absolute: 0 10*3/uL (ref 0.0–0.1)
Basophils Relative: 0 % (ref 0–1)
Basophils Relative: 1 % (ref 0–1)
Blasts: 0 %
Eosinophils Absolute: 0.1 10*3/uL (ref 0.0–0.7)
Eosinophils Relative: 1 % (ref 0–5)
Lymphocytes Relative: 41 % (ref 12–46)
Lymphocytes Relative: 47 % — ABNORMAL HIGH (ref 12–46)
Lymphocytes Relative: 55 % — ABNORMAL HIGH (ref 12–46)
Lymphocytes Relative: 57 % — ABNORMAL HIGH (ref 12–46)
Lymphocytes Relative: 67 % — ABNORMAL HIGH (ref 12–46)
Lymphocytes Relative: 72 % — ABNORMAL HIGH (ref 12–46)
Lymphs Abs: 1.2 10*3/uL (ref 0.7–4.0)
Lymphs Abs: 1.5 10*3/uL (ref 0.7–4.0)
Lymphs Abs: 1.9 10*3/uL (ref 0.7–4.0)
Lymphs Abs: 2.2 10*3/uL (ref 0.7–4.0)
Lymphs Abs: 2.4 10*3/uL (ref 0.7–4.0)
Monocytes Absolute: 0.9 10*3/uL (ref 0.1–1.0)
Monocytes Absolute: 1 10*3/uL (ref 0.1–1.0)
Monocytes Relative: 26 % — ABNORMAL HIGH (ref 3–12)
Monocytes Relative: 34 % — ABNORMAL HIGH (ref 3–12)
Monocytes Relative: 36 % — ABNORMAL HIGH (ref 3–12)
Monocytes Relative: 49 % — ABNORMAL HIGH (ref 3–12)
Neutro Abs: 0.1 10*3/uL — ABNORMAL LOW (ref 1.7–7.7)
Neutro Abs: 0.1 10*3/uL — ABNORMAL LOW (ref 1.7–7.7)
Neutrophils Relative %: 0 % — ABNORMAL LOW (ref 43–77)
Neutrophils Relative %: 2 % — ABNORMAL LOW (ref 43–77)
Neutrophils Relative %: 2 % — ABNORMAL LOW (ref 43–77)
Neutrophils Relative %: 5 % — ABNORMAL LOW (ref 43–77)
Promyelocytes Absolute: 0 %
Promyelocytes Absolute: 0 %
nRBC: 0 /100 WBC
nRBC: 0 /100 WBC

## 2011-01-24 LAB — CULTURE, BLOOD (ROUTINE X 2): Culture: NO GROWTH

## 2011-01-24 LAB — COMPREHENSIVE METABOLIC PANEL
Alkaline Phosphatase: 65 U/L (ref 39–117)
BUN: 8 mg/dL (ref 6–23)
Calcium: 8.8 mg/dL (ref 8.4–10.5)
Glucose, Bld: 131 mg/dL — ABNORMAL HIGH (ref 70–99)
Total Protein: 9.2 g/dL — ABNORMAL HIGH (ref 6.0–8.3)

## 2011-01-24 LAB — CLOSTRIDIUM DIFFICILE EIA
C difficile Toxins A+B, EIA: NEGATIVE
C difficile Toxins A+B, EIA: NEGATIVE

## 2011-01-24 LAB — URINE CULTURE: Colony Count: 5000

## 2011-01-24 LAB — URINALYSIS, ROUTINE W REFLEX MICROSCOPIC
Glucose, UA: NEGATIVE mg/dL
Ketones, ur: NEGATIVE mg/dL
Leukocytes, UA: NEGATIVE
Nitrite: NEGATIVE
Protein, ur: NEGATIVE mg/dL
Urobilinogen, UA: 4 mg/dL — ABNORMAL HIGH (ref 0.0–1.0)

## 2011-01-24 LAB — CULTURE, RESPIRATORY W GRAM STAIN

## 2011-01-24 LAB — MAGNESIUM: Magnesium: 1.5 mg/dL (ref 1.5–2.5)

## 2011-01-24 LAB — URINE MICROSCOPIC-ADD ON

## 2011-01-25 LAB — URINALYSIS, ROUTINE W REFLEX MICROSCOPIC
Glucose, UA: NEGATIVE mg/dL
Glucose, UA: NEGATIVE mg/dL
Leukocytes, UA: NEGATIVE
Leukocytes, UA: NEGATIVE
Protein, ur: NEGATIVE mg/dL
Specific Gravity, Urine: 1.008 (ref 1.005–1.030)
Specific Gravity, Urine: 1.01 (ref 1.005–1.030)
Urobilinogen, UA: 2 mg/dL — ABNORMAL HIGH (ref 0.0–1.0)
pH: 6 (ref 5.0–8.0)

## 2011-01-25 LAB — CBC
HCT: 38 % (ref 36.0–46.0)
HCT: 38.8 % (ref 36.0–46.0)
HCT: 40.6 % (ref 36.0–46.0)
Hemoglobin: 12.9 g/dL (ref 12.0–15.0)
Hemoglobin: 13.5 g/dL (ref 12.0–15.0)
Hemoglobin: 13.9 g/dL (ref 12.0–15.0)
Hemoglobin: 14.1 g/dL (ref 12.0–15.0)
Hemoglobin: 14.3 g/dL (ref 12.0–15.0)
MCHC: 34.4 g/dL (ref 30.0–36.0)
MCHC: 34.7 g/dL (ref 30.0–36.0)
MCHC: 34.7 g/dL (ref 30.0–36.0)
MCHC: 34.8 g/dL (ref 30.0–36.0)
MCHC: 35 g/dL (ref 30.0–36.0)
MCHC: 35.9 g/dL (ref 30.0–36.0)
MCV: 88.8 fL (ref 78.0–100.0)
MCV: 88.8 fL (ref 78.0–100.0)
MCV: 89.5 fL (ref 78.0–100.0)
MCV: 91.3 fL (ref 78.0–100.0)
Platelets: 128 K/uL — ABNORMAL LOW (ref 150–400)
Platelets: 132 10*3/uL — ABNORMAL LOW (ref 150–400)
Platelets: 143 10*3/uL — ABNORMAL LOW (ref 150–400)
RBC: 3.94 MIL/uL (ref 3.87–5.11)
RBC: 4.46 MIL/uL (ref 3.87–5.11)
RBC: 4.51 MIL/uL (ref 3.87–5.11)
RBC: 4.57 MIL/uL (ref 3.87–5.11)
RBC: 4.65 MIL/uL (ref 3.87–5.11)
RDW: 16.6 % — ABNORMAL HIGH (ref 11.5–15.5)
RDW: 16.8 % — ABNORMAL HIGH (ref 11.5–15.5)
RDW: 17.2 % — ABNORMAL HIGH (ref 11.5–15.5)
RDW: 17.4 % — ABNORMAL HIGH (ref 11.5–15.5)
RDW: 17.8 % — ABNORMAL HIGH (ref 11.5–15.5)
WBC: 1.6 10*3/uL — ABNORMAL LOW (ref 4.0–10.5)
WBC: 2.1 10*3/uL — ABNORMAL LOW (ref 4.0–10.5)
WBC: 2.3 K/uL — ABNORMAL LOW (ref 4.0–10.5)
WBC: 2.8 10*3/uL — ABNORMAL LOW (ref 4.0–10.5)
WBC: 4.5 10*3/uL (ref 4.0–10.5)

## 2011-01-25 LAB — DIFFERENTIAL
Band Neutrophils: 0 % (ref 0–10)
Band Neutrophils: 0 % (ref 0–10)
Basophils Absolute: 0 10*3/uL (ref 0.0–0.1)
Basophils Absolute: 0 10*3/uL (ref 0.0–0.1)
Basophils Absolute: 0 10*3/uL (ref 0.0–0.1)
Basophils Absolute: 0 K/uL (ref 0.0–0.1)
Basophils Relative: 0 % (ref 0–1)
Basophils Relative: 1 % (ref 0–1)
Basophils Relative: 1 % (ref 0–1)
Blasts: 0 %
Blasts: 0 %
Eosinophils Absolute: 0 K/uL (ref 0.0–0.7)
Eosinophils Absolute: 0.1 10*3/uL (ref 0.0–0.7)
Eosinophils Absolute: 0.1 10*3/uL (ref 0.0–0.7)
Eosinophils Relative: 1 % (ref 0–5)
Eosinophils Relative: 2 % (ref 0–5)
Eosinophils Relative: 5 % (ref 0–5)
Lymphocytes Relative: 64 % — ABNORMAL HIGH (ref 12–46)
Lymphocytes Relative: 76 % — ABNORMAL HIGH (ref 12–46)
Lymphocytes Relative: 85 % — ABNORMAL HIGH (ref 12–46)
Lymphs Abs: 1.4 10*3/uL (ref 0.7–4.0)
Lymphs Abs: 1.7 10*3/uL (ref 0.7–4.0)
Lymphs Abs: 2 K/uL (ref 0.7–4.0)
Lymphs Abs: 2.7 10*3/uL (ref 0.7–4.0)
Metamyelocytes Relative: 0 %
Monocytes Absolute: 0.2 10*3/uL (ref 0.1–1.0)
Monocytes Absolute: 0.3 10*3/uL (ref 0.1–1.0)
Monocytes Absolute: 0.3 10*3/uL (ref 0.1–1.0)
Monocytes Relative: 12 % (ref 3–12)
Monocytes Relative: 24 % — ABNORMAL HIGH (ref 3–12)
Monocytes Relative: 8 % (ref 3–12)
Myelocytes: 0 %
Neutro Abs: 0 K/uL — ABNORMAL LOW (ref 1.7–7.7)
Neutro Abs: 0.1 10*3/uL — ABNORMAL LOW (ref 1.7–7.7)
Neutro Abs: 0.3 10*3/uL — ABNORMAL LOW (ref 1.7–7.7)
Neutrophils Relative %: 1 % — ABNORMAL LOW (ref 43–77)
Neutrophils Relative %: 1 % — ABNORMAL LOW (ref 43–77)
Neutrophils Relative %: 1 % — ABNORMAL LOW (ref 43–77)
Neutrophils Relative %: 10 % — ABNORMAL LOW (ref 43–77)
Neutrophils Relative %: 4 % — ABNORMAL LOW (ref 43–77)
Promyelocytes Absolute: 0 %
Promyelocytes Absolute: 0 %
Promyelocytes Absolute: 0 %
nRBC: 0 /100 WBC

## 2011-01-25 LAB — URINE MICROSCOPIC-ADD ON

## 2011-01-25 LAB — CULTURE, BLOOD (ROUTINE X 2)
Culture: NO GROWTH
Culture: NO GROWTH

## 2011-01-25 LAB — COMPREHENSIVE METABOLIC PANEL
ALT: 11 U/L (ref 0–35)
ALT: 11 U/L (ref 0–35)
AST: 21 U/L (ref 0–37)
CO2: 19 mEq/L (ref 19–32)
Calcium: 8.6 mg/dL (ref 8.4–10.5)
Calcium: 8.8 mg/dL (ref 8.4–10.5)
Chloride: 103 mEq/L (ref 96–112)
Creatinine, Ser: 0.59 mg/dL (ref 0.4–1.2)
Creatinine, Ser: 0.63 mg/dL (ref 0.4–1.2)
GFR calc Af Amer: >60 mL/min (ref >60)
GFR calc Af Amer: >60 mL/min (ref >60)
GFR calc non Af Amer: >60 mL/min (ref >60)
Glucose, Bld: 119 mg/dL — ABNORMAL HIGH (ref 70–99)
Glucose, Bld: 130 mg/dL — ABNORMAL HIGH (ref 70–99)
Sodium: 131 mEq/L — ABNORMAL LOW (ref 135–145)
Sodium: 133 mEq/L — ABNORMAL LOW (ref 135–145)
Total Bilirubin: 1 mg/dL (ref 0.3–1.2)
Total Protein: 8.5 g/dL — ABNORMAL HIGH (ref 6.0–8.3)

## 2011-01-25 LAB — COMPREHENSIVE METABOLIC PANEL WITH GFR
AST: 30 U/L (ref 0–37)
Albumin: 2.8 g/dL — ABNORMAL LOW (ref 3.5–5.2)
Alkaline Phosphatase: 69 U/L (ref 39–117)
BUN: 6 mg/dL (ref 6–23)
CO2: 20 meq/L (ref 19–32)
Chloride: 102 meq/L (ref 96–112)
GFR calc non Af Amer: >60 mL/min (ref >60)
Potassium: 3.4 meq/L — ABNORMAL LOW (ref 3.5–5.1)
Total Bilirubin: 1.5 mg/dL — ABNORMAL HIGH (ref 0.3–1.2)

## 2011-01-25 LAB — BASIC METABOLIC PANEL
BUN: 3 mg/dL — ABNORMAL LOW (ref 6–23)
CO2: 22 mEq/L (ref 19–32)
Chloride: 107 mEq/L (ref 96–112)
Creatinine, Ser: 0.51 mg/dL (ref 0.4–1.2)
Glucose, Bld: 103 mg/dL — ABNORMAL HIGH (ref 70–99)
Potassium: 3.8 mEq/L (ref 3.5–5.1)

## 2011-01-25 LAB — URINE CULTURE

## 2011-01-25 LAB — MAGNESIUM: Magnesium: 1.7 mg/dL (ref 1.5–2.5)

## 2011-01-25 LAB — PATHOLOGIST SMEAR REVIEW

## 2011-01-25 LAB — C-REACTIVE PROTEIN: CRP: 8.2 mg/dL — ABNORMAL HIGH (ref <0.6)

## 2011-01-26 LAB — CBC
HCT: 34.9 % — ABNORMAL LOW (ref 36.0–46.0)
HCT: 35.8 % — ABNORMAL LOW (ref 36.0–46.0)
HCT: 36.8 % (ref 36.0–46.0)
HCT: 36 % (ref 36.0–46.0)
HCT: 36.3 % (ref 36.0–46.0)
HCT: 41 % (ref 36.0–46.0)
Hemoglobin: 12 g/dL (ref 12.0–15.0)
Hemoglobin: 12.2 g/dL (ref 12.0–15.0)
Hemoglobin: 12.3 g/dL (ref 12.0–15.0)
Hemoglobin: 12.7 g/dL (ref 12.0–15.0)
Hemoglobin: 11.9 g/dL — ABNORMAL LOW (ref 12.0–15.0)
Hemoglobin: 12.5 g/dL (ref 12.0–15.0)
Hemoglobin: 14.4 g/dL (ref 12.0–15.0)
MCHC: 34.5 g/dL (ref 30.0–36.0)
MCHC: 34.7 g/dL (ref 30.0–36.0)
MCHC: 35.1 g/dL (ref 30.0–36.0)
MCHC: 35.3 g/dL (ref 30.0–36.0)
MCHC: 35.5 g/dL (ref 30.0–36.0)
MCHC: 35.5 g/dL (ref 30.0–36.0)
MCHC: 34.5 g/dL (ref 30.0–36.0)
MCHC: 34.7 g/dL (ref 30.0–36.0)
MCHC: 35.1 g/dL (ref 30.0–36.0)
MCHC: 35.4 g/dL (ref 30.0–36.0)
MCV: 86.8 fL (ref 78.0–100.0)
MCV: 88.3 fL (ref 78.0–100.0)
MCV: 90.6 fL (ref 78.0–100.0)
MCV: 92 fL (ref 78.0–100.0)
MCV: 88.5 fL (ref 78.0–100.0)
MCV: 89.8 fL (ref 78.0–100.0)
MCV: 90 fL (ref 78.0–100.0)
MCV: 90.7 fL (ref 78.0–100.0)
Platelets: 261 10*3/uL (ref 150–400)
Platelets: 297 10*3/uL (ref 150–400)
Platelets: 315 10*3/uL (ref 150–400)
Platelets: 340 10*3/uL (ref 150–400)
Platelets: 213 10*3/uL (ref 150–400)
Platelets: 267 10*3/uL (ref 150–400)
Platelets: 300 10*3/uL (ref 150–400)
RBC: 3.85 MIL/uL — ABNORMAL LOW (ref 3.87–5.11)
RBC: 3.97 MIL/uL (ref 3.87–5.11)
RBC: 3.79 MIL/uL — ABNORMAL LOW (ref 3.87–5.11)
RBC: 3.91 MIL/uL (ref 3.87–5.11)
RDW: 16.1 % — ABNORMAL HIGH (ref 11.5–15.5)
RDW: 16.1 % — ABNORMAL HIGH (ref 11.5–15.5)
RDW: 16.7 % — ABNORMAL HIGH (ref 11.5–15.5)
RDW: 15 % (ref 11.5–15.5)
RDW: 15.8 % — ABNORMAL HIGH (ref 11.5–15.5)
RDW: 15.9 % — ABNORMAL HIGH (ref 11.5–15.5)
WBC: 11.6 10*3/uL — ABNORMAL HIGH (ref 4.0–10.5)
WBC: 5.1 10*3/uL (ref 4.0–10.5)
WBC: 1.3 10*3/uL — CL (ref 4.0–10.5)

## 2011-01-26 LAB — DIFFERENTIAL
Basophils Absolute: 0.1 10*3/uL (ref 0.0–0.1)
Basophils Absolute: 0.1 10*3/uL (ref 0.0–0.1)
Basophils Absolute: 0.1 10*3/uL (ref 0.0–0.1)
Basophils Absolute: 0.1 10*3/uL (ref 0.0–0.1)
Basophils Absolute: 0 10*3/uL (ref 0.0–0.1)
Basophils Absolute: 0 10*3/uL (ref 0.0–0.1)
Basophils Absolute: 0 10*3/uL (ref 0.0–0.1)
Basophils Relative: 1 % (ref 0–1)
Basophils Relative: 1 % (ref 0–1)
Basophils Relative: 2 % — ABNORMAL HIGH (ref 0–1)
Basophils Relative: 3 % — ABNORMAL HIGH (ref 0–1)
Basophils Relative: 3 % — ABNORMAL HIGH (ref 0–1)
Blasts: 0 %
Eosinophils Absolute: 0 10*3/uL (ref 0.0–0.7)
Eosinophils Absolute: 0 10*3/uL (ref 0.0–0.7)
Eosinophils Absolute: 0 10*3/uL (ref 0.0–0.7)
Eosinophils Absolute: 0 10*3/uL (ref 0.0–0.7)
Eosinophils Absolute: 0 10*3/uL (ref 0.0–0.7)
Eosinophils Absolute: 0 10*3/uL (ref 0.0–0.7)
Eosinophils Absolute: 0 10*3/uL (ref 0.0–0.7)
Eosinophils Absolute: 0 10*3/uL (ref 0.0–0.7)
Eosinophils Absolute: 0 10*3/uL (ref 0.0–0.7)
Eosinophils Relative: 0 % (ref 0–5)
Eosinophils Relative: 0 % (ref 0–5)
Eosinophils Relative: 0 % (ref 0–5)
Eosinophils Relative: 1 % (ref 0–5)
Eosinophils Relative: 1 % (ref 0–5)
Lymphocytes Relative: 23 % (ref 12–46)
Lymphocytes Relative: 68 % — ABNORMAL HIGH (ref 12–46)
Lymphocytes Relative: 76 % — ABNORMAL HIGH (ref 12–46)
Lymphs Abs: 1.4 10*3/uL (ref 0.7–4.0)
Lymphs Abs: 1.9 10*3/uL (ref 0.7–4.0)
Lymphs Abs: 3 10*3/uL (ref 0.7–4.0)
Lymphs Abs: 3.5 10*3/uL (ref 0.7–4.0)
Lymphs Abs: 0.9 10*3/uL (ref 0.7–4.0)
Lymphs Abs: 1 10*3/uL (ref 0.7–4.0)
Lymphs Abs: 1.3 10*3/uL (ref 0.7–4.0)
Lymphs Abs: 1.4 10*3/uL (ref 0.7–4.0)
Metamyelocytes Relative: 0 %
Monocytes Absolute: 0.1 10*3/uL (ref 0.1–1.0)
Monocytes Absolute: 0.3 10*3/uL (ref 0.1–1.0)
Monocytes Absolute: 0.4 10*3/uL (ref 0.1–1.0)
Monocytes Absolute: 0.9 10*3/uL (ref 0.1–1.0)
Monocytes Absolute: 0.2 10*3/uL (ref 0.1–1.0)
Monocytes Absolute: 0.2 10*3/uL (ref 0.1–1.0)
Monocytes Absolute: 0.2 10*3/uL (ref 0.1–1.0)
Monocytes Absolute: 0.3 10*3/uL (ref 0.1–1.0)
Monocytes Relative: 12 % (ref 3–12)
Monocytes Relative: 18 % — ABNORMAL HIGH (ref 3–12)
Monocytes Relative: 25 % — ABNORMAL HIGH (ref 3–12)
Monocytes Relative: 8 % (ref 3–12)
Monocytes Relative: 14 % — ABNORMAL HIGH (ref 3–12)
Monocytes Relative: 24 % — ABNORMAL HIGH (ref 3–12)
Neutro Abs: 0.1 10*3/uL — ABNORMAL LOW (ref 1.7–7.7)
Neutro Abs: 0.1 10*3/uL — ABNORMAL LOW (ref 1.7–7.7)
Neutro Abs: 11.6 10*3/uL — ABNORMAL HIGH (ref 1.7–7.7)
Neutro Abs: 0 10*3/uL — ABNORMAL LOW (ref 1.7–7.7)
Neutro Abs: 0.1 10*3/uL — ABNORMAL LOW (ref 1.7–7.7)
Neutro Abs: 0.1 10*3/uL — ABNORMAL LOW (ref 1.7–7.7)
Neutrophils Relative %: 14 % — ABNORMAL LOW (ref 43–77)
Neutrophils Relative %: 18 % — ABNORMAL LOW (ref 43–77)
Neutrophils Relative %: 3 % — ABNORMAL LOW (ref 43–77)
Neutrophils Relative %: 4 % — ABNORMAL LOW (ref 43–77)
Neutrophils Relative %: 62 % (ref 43–77)
Neutrophils Relative %: 10 % — ABNORMAL LOW (ref 43–77)
Neutrophils Relative %: 3 % — ABNORMAL LOW (ref 43–77)
Smear Review: ADEQUATE
Smear Review: ADEQUATE
nRBC: 0 /100 WBC

## 2011-01-26 LAB — WET PREP, GENITAL

## 2011-01-26 LAB — PROTEIN ELECTROPH W RFLX QUANT IMMUNOGLOBULINS
Albumin ELP: 27.8 % — ABNORMAL LOW (ref 55.8–66.1)
Beta 2: 7 % — ABNORMAL HIGH (ref 3.2–6.5)
Beta Globulin: 4.5 % — ABNORMAL LOW (ref 4.7–7.2)

## 2011-01-26 LAB — LIPID PANEL
Cholesterol: 52 mg/dL (ref 0–200)
Triglycerides: 84 mg/dL (ref <150)

## 2011-01-26 LAB — COMPREHENSIVE METABOLIC PANEL
ALT: 40 U/L — ABNORMAL HIGH (ref 0–35)
AST: 35 U/L (ref 0–37)
Albumin: 1.9 g/dL — ABNORMAL LOW (ref 3.5–5.2)
Alkaline Phosphatase: 46 U/L (ref 39–117)
BUN: 7 mg/dL (ref 6–23)
CO2: 24 mEq/L (ref 19–32)
CO2: 24 mEq/L (ref 19–32)
Calcium: 7.9 mg/dL — ABNORMAL LOW (ref 8.4–10.5)
Creatinine, Ser: 0.75 mg/dL (ref 0.4–1.2)
GFR calc Af Amer: >60 mL/min (ref >60)
GFR calc non Af Amer: >60 mL/min (ref >60)
GFR calc non Af Amer: >60 mL/min (ref >60)
Glucose, Bld: 109 mg/dL — ABNORMAL HIGH (ref 70–99)
Potassium: 3.8 mEq/L (ref 3.5–5.1)
Sodium: 129 mEq/L — ABNORMAL LOW (ref 135–145)
Total Protein: 7.7 g/dL (ref 6.0–8.3)

## 2011-01-26 LAB — MAGNESIUM
Magnesium: 1.6 mg/dL (ref 1.5–2.5)
Magnesium: 1.8 mg/dL (ref 1.5–2.5)

## 2011-01-26 LAB — URINALYSIS, ROUTINE W REFLEX MICROSCOPIC
Bilirubin Urine: NEGATIVE
Glucose, UA: NEGATIVE mg/dL
Hgb urine dipstick: NEGATIVE
Leukocytes, UA: NEGATIVE
Nitrite: NEGATIVE
Protein, ur: NEGATIVE mg/dL
Specific Gravity, Urine: 1.012 (ref 1.005–1.030)
Urobilinogen, UA: 0.2 mg/dL (ref 0.0–1.0)
pH: 6.5 (ref 5.0–8.0)

## 2011-01-26 LAB — URINE MICROSCOPIC-ADD ON

## 2011-01-26 LAB — BASIC METABOLIC PANEL
BUN: <1 mg/dL — ABNORMAL LOW (ref 6–23)
BUN: 3 mg/dL — ABNORMAL LOW (ref 6–23)
BUN: 3 mg/dL — ABNORMAL LOW (ref 6–23)
CO2: 25 mEq/L (ref 19–32)
CO2: 23 mEq/L (ref 19–32)
CO2: 23 mEq/L (ref 19–32)
CO2: 24 mEq/L (ref 19–32)
Calcium: 8 mg/dL — ABNORMAL LOW (ref 8.4–10.5)
Calcium: 7.9 mg/dL — ABNORMAL LOW (ref 8.4–10.5)
Chloride: 101 mEq/L (ref 96–112)
Chloride: 103 mEq/L (ref 96–112)
GFR calc non Af Amer: >60 mL/min (ref >60)
GFR calc non Af Amer: >60 mL/min (ref >60)
Glucose, Bld: 98 mg/dL (ref 70–99)
Glucose, Bld: 108 mg/dL — ABNORMAL HIGH (ref 70–99)
Glucose, Bld: 112 mg/dL — ABNORMAL HIGH (ref 70–99)
Glucose, Bld: 98 mg/dL (ref 70–99)
Potassium: 4 mEq/L (ref 3.5–5.1)
Potassium: 4.5 mEq/L (ref 3.5–5.1)
Sodium: 130 mEq/L — ABNORMAL LOW (ref 135–145)

## 2011-01-26 LAB — GC/CHLAMYDIA PROBE AMP, GENITAL
Chlamydia, DNA Probe: NEGATIVE
GC Probe Amp, Genital: NEGATIVE

## 2011-01-26 LAB — CULTURE, BLOOD (ROUTINE X 2)
Culture: NO GROWTH
Culture: NO GROWTH

## 2011-01-26 LAB — URINE CULTURE

## 2011-01-26 LAB — TSH: TSH: 0.566 u[IU]/mL (ref 0.350–4.500)

## 2011-01-26 LAB — HERPES SIMPLEX VIRUS CULTURE
Culture: NOT DETECTED
Culture: NOT DETECTED

## 2011-01-26 LAB — IGG, IGA, IGM
IgG (Immunoglobin G), Serum: 2600 mg/dL — ABNORMAL HIGH (ref 694–1618)
IgM, Serum: 292 mg/dL — ABNORMAL HIGH (ref 60–263)

## 2011-01-26 LAB — HOMOCYSTEINE: Homocysteine: 7.7 umol/L (ref 4.0–15.4)

## 2011-01-26 LAB — HSV 2 ANTIBODY, IGG: HSV 2 Glycoprotein G Ab, IgG: 5.9 IV — ABNORMAL HIGH

## 2011-01-26 LAB — IMMUNOFIXATION ADD-ON

## 2011-01-30 LAB — WET PREP, GENITAL
Trich, Wet Prep: NONE SEEN
Yeast Wet Prep HPF POC: NONE SEEN

## 2011-01-30 LAB — CULTURE, ROUTINE-ABSCESS
Culture: NO GROWTH
Gram Stain: NONE SEEN

## 2011-01-30 LAB — RAPID URINE DRUG SCREEN, HOSP PERFORMED
Amphetamines: NOT DETECTED
Benzodiazepines: NOT DETECTED
Cocaine: NOT DETECTED
Tetrahydrocannabinol: NOT DETECTED

## 2011-01-30 LAB — DIFFERENTIAL
Basophils Absolute: 0 10*3/uL (ref 0.0–0.1)
Basophils Absolute: 0 10*3/uL (ref 0.0–0.1)
Basophils Absolute: 0 10*3/uL (ref 0.0–0.1)
Basophils Relative: 0 % (ref 0–1)
Basophils Relative: 1 % (ref 0–1)
Blasts: 0 %
Eosinophils Absolute: 0 10*3/uL (ref 0.0–0.7)
Eosinophils Absolute: 0.1 10*3/uL (ref 0.0–0.7)
Eosinophils Relative: 0 % (ref 0–5)
Eosinophils Relative: 2 % (ref 0–5)
Lymphocytes Relative: 67 % — ABNORMAL HIGH (ref 12–46)
Lymphocytes Relative: 86 % — ABNORMAL HIGH (ref 12–46)
Lymphs Abs: 1 10*3/uL (ref 0.7–4.0)
Lymphs Abs: 1.7 10*3/uL (ref 0.7–4.0)
Lymphs Abs: 1.8 10*3/uL (ref 0.7–4.0)
Monocytes Absolute: 0.9 10*3/uL (ref 0.1–1.0)
Monocytes Relative: 16 % — ABNORMAL HIGH (ref 3–12)
Monocytes Relative: 25 % — ABNORMAL HIGH (ref 3–12)
Monocytes Relative: 30 % — ABNORMAL HIGH (ref 3–12)
Monocytes Relative: 33 % — ABNORMAL HIGH (ref 3–12)
Myelocytes: 0 %
Neutro Abs: 0 10*3/uL — ABNORMAL LOW (ref 1.7–7.7)
Neutro Abs: 0 10*3/uL — ABNORMAL LOW (ref 1.7–7.7)
Neutro Abs: 0 10*3/uL — ABNORMAL LOW (ref 1.7–7.7)
Neutro Abs: 0.1 10*3/uL — ABNORMAL LOW (ref 1.7–7.7)
Neutrophils Relative %: 1 % — ABNORMAL LOW (ref 43–77)
Neutrophils Relative %: 4 % — ABNORMAL LOW (ref 43–77)
Neutrophils Relative %: 4 % — ABNORMAL LOW (ref 43–77)
Promyelocytes Absolute: 0 %
Smear Review: ADEQUATE

## 2011-01-30 LAB — CULTURE, BLOOD (ROUTINE X 2)
Culture: NO GROWTH
Culture: NO GROWTH

## 2011-01-30 LAB — URINALYSIS, ROUTINE W REFLEX MICROSCOPIC
Glucose, UA: NEGATIVE mg/dL
Ketones, ur: 15 mg/dL — AB
Protein, ur: 30 mg/dL — AB
Specific Gravity, Urine: 1.019 (ref 1.005–1.030)
Specific Gravity, Urine: 1.027 (ref 1.005–1.030)
pH: 6 (ref 5.0–8.0)

## 2011-01-30 LAB — CBC
HCT: 33.2 % — ABNORMAL LOW (ref 36.0–46.0)
HCT: 34 % — ABNORMAL LOW (ref 36.0–46.0)
HCT: 35.8 % — ABNORMAL LOW (ref 36.0–46.0)
Hemoglobin: 12 g/dL (ref 12.0–15.0)
Hemoglobin: 12.4 g/dL (ref 12.0–15.0)
MCHC: 33.2 g/dL (ref 30.0–36.0)
MCHC: 33.8 g/dL (ref 30.0–36.0)
MCHC: 33.9 g/dL (ref 30.0–36.0)
MCV: 91.4 fL (ref 78.0–100.0)
MCV: 93.1 fL (ref 78.0–100.0)
MCV: 95.3 fL (ref 78.0–100.0)
MCV: 96.1 fL (ref 78.0–100.0)
Platelets: 293 10*3/uL (ref 150–400)
Platelets: 306 10*3/uL (ref 150–400)
Platelets: 321 10*3/uL (ref 150–400)
Platelets: 321 10*3/uL (ref 150–400)
Platelets: 327 10*3/uL (ref 150–400)
Platelets: 331 10*3/uL (ref 150–400)
Platelets: ADEQUATE 10*3/uL (ref 150–400)
RBC: 3.45 MIL/uL — ABNORMAL LOW (ref 3.87–5.11)
RBC: 3.49 MIL/uL — ABNORMAL LOW (ref 3.87–5.11)
RBC: 3.56 MIL/uL — ABNORMAL LOW (ref 3.87–5.11)
RBC: 3.78 MIL/uL — ABNORMAL LOW (ref 3.87–5.11)
RBC: 3.94 MIL/uL (ref 3.87–5.11)
RDW: 12.6 % (ref 11.5–15.5)
RDW: 12.9 % (ref 11.5–15.5)
RDW: 13.3 % (ref 11.5–15.5)
RDW: 13.4 % (ref 11.5–15.5)
RDW: 13.4 % (ref 11.5–15.5)
RDW: 13.5 % (ref 11.5–15.5)
WBC: 2 10*3/uL — ABNORMAL LOW (ref 4.0–10.5)
WBC: 2.2 10*3/uL — ABNORMAL LOW (ref 4.0–10.5)
WBC: 2.2 10*3/uL — ABNORMAL LOW (ref 4.0–10.5)
WBC: 2.3 10*3/uL — ABNORMAL LOW (ref 4.0–10.5)
WBC: 2.4 10*3/uL — ABNORMAL LOW (ref 4.0–10.5)
WBC: 2.7 10*3/uL — ABNORMAL LOW (ref 4.0–10.5)
WBC: 3 10*3/uL — ABNORMAL LOW (ref 4.0–10.5)

## 2011-01-30 LAB — BASIC METABOLIC PANEL
BUN: 8 mg/dL (ref 6–23)
BUN: <1 mg/dL — ABNORMAL LOW (ref 6–23)
BUN: <1 mg/dL — ABNORMAL LOW (ref 6–23)
CO2: 25 mEq/L (ref 19–32)
CO2: 28 mEq/L (ref 19–32)
Calcium: 8.2 mg/dL — ABNORMAL LOW (ref 8.4–10.5)
Chloride: 102 mEq/L (ref 96–112)
Chloride: 105 mEq/L (ref 96–112)
Chloride: 94 mEq/L — ABNORMAL LOW (ref 96–112)
Creatinine, Ser: 0.51 mg/dL (ref 0.4–1.2)
Creatinine, Ser: 0.52 mg/dL (ref 0.4–1.2)
Creatinine, Ser: 0.55 mg/dL (ref 0.4–1.2)
Creatinine, Ser: 0.71 mg/dL (ref 0.4–1.2)
Creatinine, Ser: 0.8 mg/dL (ref 0.4–1.2)
GFR calc Af Amer: >60 mL/min (ref >60)
GFR calc Af Amer: >60 mL/min (ref >60)
GFR calc non Af Amer: >60 mL/min (ref >60)
GFR calc non Af Amer: >60 mL/min (ref >60)
Glucose, Bld: 104 mg/dL — ABNORMAL HIGH (ref 70–99)
Glucose, Bld: 114 mg/dL — ABNORMAL HIGH (ref 70–99)
Glucose, Bld: 145 mg/dL — ABNORMAL HIGH (ref 70–99)
Potassium: 3 mEq/L — ABNORMAL LOW (ref 3.5–5.1)

## 2011-01-30 LAB — COMPREHENSIVE METABOLIC PANEL
ALT: 11 U/L (ref 0–35)
ALT: 13 U/L (ref 0–35)
AST: 23 U/L (ref 0–37)
AST: 26 U/L (ref 0–37)
AST: 26 U/L (ref 0–37)
AST: 34 U/L (ref 0–37)
Albumin: 1.9 g/dL — ABNORMAL LOW (ref 3.5–5.2)
Albumin: 2 g/dL — ABNORMAL LOW (ref 3.5–5.2)
Alkaline Phosphatase: 36 U/L — ABNORMAL LOW (ref 39–117)
CO2: 26 mEq/L (ref 19–32)
CO2: 26 mEq/L (ref 19–32)
Calcium: 8.2 mg/dL — ABNORMAL LOW (ref 8.4–10.5)
Calcium: 8.3 mg/dL — ABNORMAL LOW (ref 8.4–10.5)
Calcium: 8.5 mg/dL (ref 8.4–10.5)
Chloride: 103 mEq/L (ref 96–112)
Chloride: 104 mEq/L (ref 96–112)
Chloride: 106 mEq/L (ref 96–112)
Creatinine, Ser: 0.59 mg/dL (ref 0.4–1.2)
Creatinine, Ser: 0.61 mg/dL (ref 0.4–1.2)
Creatinine, Ser: 0.63 mg/dL (ref 0.4–1.2)
GFR calc Af Amer: >60 mL/min (ref >60)
GFR calc Af Amer: >60 mL/min (ref >60)
GFR calc Af Amer: >60 mL/min (ref >60)
GFR calc Af Amer: >60 mL/min (ref >60)
GFR calc non Af Amer: >60 mL/min (ref >60)
GFR calc non Af Amer: >60 mL/min (ref >60)
Potassium: 3.7 mEq/L (ref 3.5–5.1)
Potassium: 3.7 mEq/L (ref 3.5–5.1)
Sodium: 137 mEq/L (ref 135–145)
Sodium: 137 mEq/L (ref 135–145)
Total Bilirubin: 0.5 mg/dL (ref 0.3–1.2)
Total Bilirubin: 0.7 mg/dL (ref 0.3–1.2)
Total Bilirubin: 0.7 mg/dL (ref 0.3–1.2)
Total Protein: 6.9 g/dL (ref 6.0–8.3)

## 2011-01-30 LAB — URINE MICROSCOPIC-ADD ON

## 2011-01-30 LAB — HEPATIC FUNCTION PANEL
ALT: 27 U/L (ref 0–35)
Alkaline Phosphatase: 56 U/L (ref 39–117)
Bilirubin, Direct: 0.6 mg/dL — ABNORMAL HIGH (ref 0.0–0.3)
Indirect Bilirubin: 0.6 mg/dL (ref 0.3–0.9)
Total Bilirubin: 1.2 mg/dL (ref 0.3–1.2)
Total Protein: 9.3 g/dL — ABNORMAL HIGH (ref 6.0–8.3)

## 2011-01-30 LAB — EPSTEIN-BARR VIRUS EARLY D ANTIGEN ANTIBODY, IGG: EBV EA IgG: 0.57 {ISR}

## 2011-01-30 LAB — UIFE/LIGHT CHAINS/TP QN, 24-HR UR
Free Kappa Lt Chains,Ur: 10.5 mg/dL — ABNORMAL HIGH (ref 0.04–1.51)
Free Kappa/Lambda Ratio: 7.19 ratio — ABNORMAL HIGH (ref 0.46–4.00)
Free Lambda Lt Chains,Ur: 1.46 mg/dL — ABNORMAL HIGH (ref 0.08–1.01)
Free Lt Chn Excr Rate: 262.5 mg/d
Total Protein, Urine-Ur/day: 305 mg/d — ABNORMAL HIGH (ref 10–140)
Total Protein, Urine: 12.2 mg/dL
Volume, Urine: 2500 mL

## 2011-01-30 LAB — POCT I-STAT, CHEM 8
Calcium, Ion: 1 mmol/L — ABNORMAL LOW (ref 1.12–1.32)
Chloride: 100 mEq/L (ref 96–112)
Creatinine, Ser: 0.6 mg/dL (ref 0.4–1.2)
Glucose, Bld: 105 mg/dL — ABNORMAL HIGH (ref 70–99)
Potassium: 4.1 mEq/L (ref 3.5–5.1)

## 2011-01-30 LAB — FOLATE: Folate: 18.5 ng/mL

## 2011-01-30 LAB — VITAMIN B12: Vitamin B-12: 625 pg/mL (ref 211–911)

## 2011-01-30 LAB — HERPES SIMPLEX VIRUS CULTURE: Culture: NOT DETECTED

## 2011-01-30 LAB — HEMOGLOBIN A1C
Hgb A1c MFr Bld: 5.6 % (ref 4.6–6.1)
Mean Plasma Glucose: 114 mg/dL

## 2011-01-30 LAB — CULTURE, ROUTINE-GENITAL: Culture: NORMAL

## 2011-01-30 LAB — IRON AND TIBC
Saturation Ratios: 23 % (ref 20–55)
TIBC: 158 ug/dL — ABNORMAL LOW (ref 250–470)
UIBC: 121 ug/dL

## 2011-01-30 LAB — URINE CULTURE: Special Requests: POSITIVE

## 2011-01-30 LAB — GC/CHLAMYDIA PROBE AMP, GENITAL: GC Probe Amp, Genital: NEGATIVE

## 2011-01-30 LAB — VANCOMYCIN, TROUGH
Vancomycin Tr: 11.3 ug/mL (ref 10.0–20.0)
Vancomycin Tr: 20.3 ug/mL — ABNORMAL HIGH (ref 10.0–20.0)

## 2011-01-30 LAB — RAPID STREP SCREEN (MED CTR MEBANE ONLY): Streptococcus, Group A Screen (Direct): NEGATIVE

## 2011-01-30 LAB — RPR: RPR Ser Ql: NONREACTIVE

## 2011-01-30 LAB — BONE MARROW EXAM

## 2011-01-30 LAB — ANAEROBIC CULTURE: Gram Stain: NONE SEEN

## 2011-01-30 LAB — LIPID PANEL
Cholesterol: 54 mg/dL (ref 0–200)
LDL Cholesterol: 28 mg/dL (ref 0–99)

## 2011-01-30 LAB — ANTI-NEUTROPHIL ANTIBODY

## 2011-01-30 LAB — MAGNESIUM: Magnesium: 1.8 mg/dL (ref 1.5–2.5)

## 2011-01-30 LAB — KAPPA/LAMBDA LIGHT CHAINS
Kappa, lambda light chain ratio: 1.32 (ref 0.26–1.65)
Lambda free light chains: 8.21 mg/dL — ABNORMAL HIGH (ref 0.57–2.63)

## 2011-01-30 LAB — HSV PCR: HSV 2 , PCR: NOT DETECTED

## 2011-01-30 LAB — FOLATE RBC: RBC Folate: 815 ng/mL — ABNORMAL HIGH (ref 180–600)

## 2011-01-30 LAB — HEPATITIS PANEL, ACUTE: HCV Ab: REACTIVE — AB

## 2011-01-30 LAB — ANA: Anti Nuclear Antibody(ANA): NEGATIVE

## 2011-01-30 LAB — TSH: TSH: 0.84 u[IU]/mL (ref 0.350–4.500)
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
## 2011-02-28 NOTE — Discharge Summary (Signed)
NAME:  Katrina Kelley, Katrina Kelley           ACCOUNT NO.:  [id number]   MEDICAL RECORD NO.:  [id number]          PATIENT TYPE:  INP   LOCATION:  5011                         FACILITY:  [facility]   PHYSICIAN:  Hind I Elsaid, MD      DATE OF BIRTH:  10/22/1955   DATE OF ADMISSION:  12/24/2008  DATE OF DISCHARGE:  01/05/2009                               DISCHARGE SUMMARY   PRIMARY CARE PHYSICIAN:  Dr. Reche Dixon at [facility]   HEMATOLOGIST/ONCOLOGIST:  Dr. Lajuana Matte   DISCHARGE DIAGNOSES:  1. Neutropenic fever, resolved.  2. Hepatitis C positive with high virus load.  3. 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  4. Bacterial vaginosis treated.  5. Oral thrush.  6. Oral and genital ulcers felt secondary to infiltration with      plasmacytoma status post biopsy.  7. History of drug abuse.  8. History of bipolar disorder.  9. 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   MEDICATIONS:  1. Nystatin 5 mg p.o. q.i.d.  2. Oxycodone 5 mg p.o. q.6 h. p.r.n.  3. Folic acid 1 mg daily.  4. Ambien 100 mg p.o. daily.  5. Protonix 40 mg p.o. daily.   HOSPITAL COURSE:  Please review the progress note done on December 29, 2008.  This is a 55 year old female who has prior history of admission  secondary to neutropenia.  The patient is well-known to Dr. Arbutus Ped who  has seen for evaluation of neutropenic fever.  Underwent pulmonary  biopsy at that time as well as biopsy of oral ulcer, shows  plasmacytosis.  The patient has monoclonal gammopathy picture though  with inflammatory process rather than real multiple myeloma.  This  patient has high titer of hepatitis C but has never been treated.  Patient admitted with neutropenic fever.  Consultation was done by  Infectious Disease and the patient has blood cultures which did not grow  any microorganisms.  Had culture here  which was negative.  Urine culture  was negative from December 24, 2008.  Infectious Disease consulted with  patient on vancomycin, cefepime, Flagyl, and fluconazole.  The source of  infection was not identified.  Accordingly, vancomycin was stopped.  The  patient completed cefepime and vancomycin for a total of 14 days.  Infectious Disease recommended to stop the antibiotic.  She has a urine  culture which grew out in the urine about 14,000 colonies and as per  Infectious Disease recommendation, do not treat and observe off  antibiotics.  The patient has no more fever for more than 4 days.  The  patient's symptoms completely resolved.  For the neutropenic fever, the  patient remained on Neupogen.  Her ANC improved, last ANC which was  drawn on January 05, 2009.  This showed evidence of absolute lymphocyte  more than 700 and Neupogen accordingly was stopped.  As we mentioned,  the patient's symptoms completely resolved.  Dr. Arbutus Ped, the patient's  hematologist, did evaluate the patient and he recommended that  the  patient had plasmacytosis and likely reactive to hepatitis C.  No  monoclonal gammopathy and the patient has monoclonal which is reactive.  He recommended the patient needed to have [facility] appointment  for treatment of hepatitis C before discharge.  We called [facility] where they faxed [place] her appointment.  We filled out the form and as  per discussion with them, they will contact the patient at her current  address or through the mail to schedule her current appointment.  All of  her medical records and registration form was filled and faxed to [facility], will contact the patient for actual date and time.  Patient  information sent to [phone].  We sent them this information of this  discharge to the fax number [phone] for the liver program at [facility].      Hind Bosie Helper, MD  Electronically Signed     HIE/MEDQ  D:  01/05/2009  T:  01/05/2009  Job:   [id number]   cc:   Dineen Kid. Reche Dixon, M.D.  Lajuana Matte, MD

## 2011-02-28 NOTE — Group Therapy Note (Signed)
NAME:  ELLIOTT, QUADE NO.:  [id number]   MEDICAL RECORD NO.:  [id number]          PATIENT TYPE:  INP   LOCATION:  6740                         FACILITY:  [facility]   PHYSICIAN:  Peggye Pitt, M.D. DATE OF BIRTH:  1956-01-06                                 PROGRESS NOTE   DIAGNOSES UP TO DATE:  Include:  1. Pneumonia.  2. Leukopenia.  3. Thrombocytopenia.  4. History of plasmacytoma.  5. Cocaine dependency.  6. Hepatitis C.  7. History of bipolar disorder.   MEDICATIONS:  Will be dictated at time of discharge by discharging physician.   HOSPITAL COURSE UP TO DATE:  Mrs. Feig is a 55 year old African American woman initially  admitted on May 21, 2009, with fevers, chills, cough, and chest pain.  A chest x-ray was done in the emergency department that showed evidence  for a right lower lobe air space disease worrisome for pneumonia.  Because of her immunocompromised state with severe leukopenia at 1.4 and  neutropenia, she was started on broad-spectrum antibiotics consisting of  vancomycin and Primaxin.  Up-to-date culture data has been negative  although no appropriate sputum sample has been received by the lab.  Dr.  Arbutus Ped with Oncology was consulted.  He is the oncologist who follows  Mrs. Hedberg in the outpatient setting for her plasma cytoma.  He gave  her a few doses of Neulasta and her white count promptly rose up to 5.1.  Her antibiotics have since been switched over to p.o. Avelox of which  she will complete a 10-day course that is scheduled to finish on June 01, 2009.  Mrs. Boutwell also has a history of hepatitis C and because  of her homelessness has never been able to follow up at the liver  clinic.  I will ask case management today to assist me with obtaining an  appointment there per Dr. Asa Lente request.  Other issues with Mrs.  Blankenhorn are her cocaine dependency and she was cocaine positive upon  admission and she did  exhibit some signs of cocaine withdrawal,  particularly tremors and restlessness.  She was started on Ativan which  we have now switched over to p.o. as needed.  At this point, Dr. Arbutus Ped  has recommended that we look into possibly finding an assisted living  facility for her given her chronic medical conditions that require  followup and transportation to and back from appointments.  I have  involved social work and right now the only thing keeping her in the  hospital is a decision on whether or not she will qualify for an  assisted living facility.  Her FL2 has been signed and is waiting for  social work to fax her out.   VITAL SIGNS ON DAY OF THIS DICTATION:  Blood pressure 116/82.  Heart rate 64.  Respirations 18.  O2 saturations  97% on room air with a temp of 98.2.      Peggye Pitt, M.D.  Electronically Signed     EH/MEDQ  D:  05/25/2009  T:  05/25/2009  Job:  [id number]

## 2011-02-28 NOTE — Discharge Summary (Signed)
NAMESONNA, LIPSKY NO.:  [id number]   MEDICAL RECORD NO.:  [id number]          PATIENT TYPE:  INP   LOCATION:  1512                         [facility]ACILITY:  [facility]   PHYSICIAN:  Peggye Pitt, M.D. DATE OF BIRTH:  [birth year]   DATE OF ADMISSION:  04/28/2009  DATE OF DISCHARGE:  04/30/2009                               DISCHARGE SUMMARY   The patient left AMA on April 30, 2009.   DISCHARGE DIAGNOSES:  1. Chest pain.  2. Cocaine abuse.  3. Alcohol abuse.  4. Thrombocytopenia.  5. Plasmacytoma followed by Dr. Shirline Frees.  6. Bipolar disorder.   MEDICATIONS:  The patient left AMA hence she was not discharged on any  medications.   HOSPITAL COURSE:  Ms. Newgent is a 55 year old African American woman  who has a history of cocaine and alcohol abuse who is also homeless.  She was admitted on 07/14 secondary to chest pain and shortness of  breath.  Upon arrival, she was found to have elevated CK-MB's up to 25.  However, her troponins were always normal.  Her UDS was positive for  cocaine, so it was assumed that this could possibly be a cocaine effect.  She had no acute ST or T-wave changes, although her EKG did show an old  septal myocardial infarction with Q-waves.  A 2-D echocardiogram was  ordered.  Cardiac enzymes were cycled.  Her CK-MB normalized to 2.4.  Her EKGs remained stable.  However, on the morning of 07/16, before I  was able to evaluate her, she decided to sign out AMA.      Peggye Pitt, M.D.  Electronically Signed     EH/MEDQ  D:  05/11/2009  T:  05/11/2009  Job:  [id number]
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
## 2011-02-28 NOTE — Discharge Summary (Signed)
NAME:  Katrina Kelley, Katrina Kelley           ACCOUNT NO.:  [id number]   MEDICAL RECORD NO.:  [id number]          PATIENT TYPE:  INP   LOCATION:  5011                         FACILITY:  [facility]   PHYSICIAN:  Hind I Elsaid, MD      DATE OF BIRTH:  10-23-1955   DATE OF ADMISSION:  12/24/2008  DATE OF DISCHARGE:  01/05/2009                               DISCHARGE SUMMARY   PRIMARY CARE PHYSICIAN:  Dr. Reche Dixon at [facility].   HEMATOLOGIST/ONCOLOGIST:  Dr. Si Gaul.   DISCHARGE DIAGNOSES:  1. Neutropenic fever, resolved.  2. Hepatitis C with history of high viral load.  3. 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      Patient not yet diagnosed with multiple myeloma as per Dr. Arbutus Ped.  4. Chronic back pain.  5. Bacterial vaginosis, treated with Flagyl.  6. Oral thrush.  7. Oral and genital ulcers felt to be secondary to infiltration with      plasma cells.  8. Inflammatory process secondary to hyper Hep C load.  9. Homeless.  10.History of polysubstance abuse over 9 months.  11.History of bipolar disorder.   MEDICATIONS:  1. Nystatin 5 mL p.o. q.i.d.  2. Oxycodone 5 mg every 6 hours p.r.n.  3. Thiamine 100 mg p.o. daily.  4. Folic acid 1 mg daily.  5. Albuterol MDI 2 puffs every 4 hours p.r.n. for shortness of breath.   For hospital course, please review the progress note done on December 29, 2008.  Patient admitted with neutropenic fever.  Blood culture was  negative.  Urinalysis was negative.  Genitalia culture for gonococcus  and chlamydia have been negative.  Chest x-rays was negative.  Herpes  simplex was also negative.   Dictation ended at this point.      Hind Bosie Helper, MD  Electronically Signed     HIE/MEDQ  D:  01/05/2009  T:  01/05/2009  Job:  [id number]

## 2011-02-28 NOTE — H&P (Signed)
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  The patient was sent by her primary care physician to the  emergency department for evaluation.  The patient denies any night  sweats, chest pain, PND, orthopnea.  Denies diarrhea or constipation.  Denies nausea, vomiting, but she does report weakness.  The patient is  neutropenic with 0 neutrophils upon differential and a total white blood  cell count of 2.3, which is decreased from 4.1 on previous labs.  The  patient has a history of neutropenic fever secondary to her  plasmacytoma.  The patient being admitted for further evaluation and  treatment.   PAST MEDICAL HISTORY:  1. Pyelonephritis.  2. Hepatitis C.  3. History of polysubstance abuse.  4. Bipolar disorder.  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   SOCIAL HISTORY:  The patient denies alcohol use.  Per her report,  smoking one pack per day and has a history of cocaine abuse as well as  being homeless.   FAMILY HISTORY:  Not available.   REVIEW OF SYSTEMS:  All other comprehensive review of systems are  negative.   ALLERGIES:  The patient has allergies to DEMEROL and PENICILLIN which  she says causes her a rash.   PHYSICAL EXAMINATION:  GENERAL:  This is a well-developed, well-  nourished black female currently in no  apparent distress.  VITAL SIGNS:  Blood pressure 100/66, heart rate 97, respiratory rate 18,  temperature 98, upon admission temperature was 102.6.  HEENT:  No  jugular venous distention or lymphadenopathy.  Oropharynx is clear.  Mucous membrane is pink and moist.  TMs clear bilaterally.  Pupils are  equal and reactive to light and accommodation.  Extraocular muscles are  intact.  CARDIOVASCULAR:  Regular rate and rhythm without murmurs, rubs, or  gallops.  PULMONARY:  Lungs are clear to auscultation bilaterally.  ABDOMEN:  Soft, somewhat tender but diffusely so.  She has no rebound or  guarding.  She has bowel sounds that are present.  EXTREMITIES:  No  clubbing, cyanosis, or edema.  NEURO:  Cranial nerves II through XII are grossly intact.  She has no  focal neurological deficits.   LABORATORY DATA:  White count 2.3 with 0% absolute neutrophil count,  hemoglobin 14.1, hematocrit 40.6, MCV 88, platelets 128.  Sodium 133,  potassium 3.4, chloride 102, bicarb 20, BUN 6, creatinine 0.63, glucose  119, calcium 8.8 corrected with an albumin of 2.8 and his calcium of  10.1.  UA shows bacteria and mild wbc's.  AST of 30, ALT of 11.   ASSESSMENT AND PLAN:  1. Pyelonephritis.  We will treat with IV cefepime as well as      vancomycin given her recent hospitalization.  We have talked with      Oncology and they will see her tomorrow.  The patient would not do      well with additional bone biopsy I believe at this time.  If the      patient does indeed have multiple myeloma, treatment would be      indicated.  2. Hepatitis C.  Currently stable.  She has a past history of having      high viral load; however, she has normal AST and ALT and I do not      believe her hepatitis has been exacerbated at this time.  3. Bipolar disorder.  Currently stable.  She is not taking medication      for this.  4. Polysubstance abuse.  Currently stable.  We will monitor for signs      of withdrawal.  5.  Neutropenic fever as above.  6. Plasmacytoma.  As above, the patient will be evaluated by      Hematology/Oncology for possible conversion of multiple myeloma  7. Tobacco abuse.  The patient declines patch currently.  8. Disposition.  The patient is full code.   H&P was constructed by reviewing past medical history, conferring with  emergency medical room physician, and reviewing the emergency medical  record.   TIME SPENT:  One hour.      Renee Ramus, MD  Electronically Signed     JF/MEDQ  D:  02/01/2009  T:  02/02/2009  Job:  [id number]   cc:   Dineen Kid. Reche Dixon, M.D.  Lajuana Matte, MD

## 2011-02-28 NOTE — H&P (Signed)
NAME:  Katrina Kelley, Katrina Kelley NO.:  [id number]   MEDICAL RECORD NO.:  [id number]          PATIENT TYPE:  INP   LOCATION:  6740                         FACILITY:  [facility]   PHYSICIAN:  Jude Enid Baas, MD          DATE OF BIRTH:  [birth year]   DATE OF ADMISSION:  05/20/2009  DATE OF DISCHARGE:                              HISTORY & PHYSICAL   PRIMARY CARE PHYSICIAN:  The patient is unassigned.   ONCOLOGIST:  Dr. Arbutus Ped.   CHIEF COMPLAINT:  1. Fevers, chills, cough, chest pain, and diffuse joint pains with      myalgias.  2. Diffuse erythematous skin nodules.   HISTORY OF PRESENTING COMPLAINT:  The patient is a 55 year old with  history of bipolar cocaine abuse, hepatitis C, plasmacytoma who  presented to the ED with complaints of fever, chills, cough, chest pain,  and diffuse joint pains and myalgias.  Also noted diffuse skin nodules  all over today prior to presentation.  The patient claims that this  usually occurs whenever her white blood cell counts are very low.  Of  note, she was previously diagnosed with plasmocytoma in January 2010 and  being followed by Dr. Arbutus Ped.  The patient was evaluated here in the ED  and found to have erythematous nodules all over her skin and was also  noted to have a WBC count of 2.3 and also noted to have urinary tract  infection on urinalysis done in the ED.  The patient had been given a  dose of IV Rocephin already in the ED.  The ED physician had discussed  case with her oncologist, Dr. Shirline Frees who recommended the patient be  placed on IV vancomycin and Primaxin until he evaluates the patient in  the morning.  The patient has been admitted to [facility] team  for further evaluation and management.   PAST MEDICAL HISTORY:  1. Significant for 1 plasmacytoma diagnosed in January 2010, been      followed by Dr. Arbutus Ped, her oncologist.  2. History of hepatitis C.  3. History of thrombocytopenia.  4. History of cocaine abuse and  alcohol abuse.  5. History of bipolar disorder.   ALLERGIES:  The patient is allergic to DEMEROL and PENICILLIN.   OUTPATIENT MEDICATIONS:  The patient is not on any medications at this  time.   FAMILY HISTORY:  The patient is currently homeless and lives in a  homeless shelter at this time.  Has 5 children.  Smokes less than half a  pack of cigarettes per day, started smoking at the age of 65.  Drinks  alcohol occasionally.  He uses cocaine and marijuana.   REVIEW OF SYSTEMS:  GENERAL: Positive for subjective fevers at home with  chills.  No loss of appetite or weight loss.  RESPIRATORY:  Occasional  cough productive of whitish sputum.  No shortness of breath.  CVS:  Positive for atypical chest pain.  GENITOURINARY:  No dysuria,  frequency, or urgency.  HEME:  No easy skin bruising or epistaxis.  PSYCH:  No depression.  MUSCULOSKELETAL:  No joint pains or muscle ache.  Generalized skin nodules.  NEURO:  No headache or seizures.   PHYSICAL EXAMINATION:  VITAL SIGNS:  On presentation, blood pressure  111/76, pulse rate of 87, respiratory rate of 18, temperature 99.1, O2  sat of 94%.  GENERAL:  On exam, found an middle-aged [place] American lady currently  not in any acute distress.  HEENT:  Pupils equal, round, reactive to light and accommodation  bilaterally.  NECK:  Supple.  No JVD.  No thyroid enlargement.  RESPIRATORY:  Chest clear to auscultation bilaterally.  CVS:  Heart sounds 1 and 2.  Regular rate and rhythm.  No murmurs.  ABDOMEN:  Full, nontender.  No organomegaly.  Bowel sounds positive in  all quadrants.  EXTREMITIES:  No cyanosis, clubbing or edema.  SKIN/MUSCULOSKELETAL:  Diffuse skin nodules measured 1-2 cm x 1-2 cm and  found on workup on lower extremities and trunk.  CNS:  The patient is awake, alert, oriented in time, place, and person.  No focal findings noted.   LABORATORY DATA:  From presentation, CBC with a white count of 2.3 with  an atrophic count of  48%, hemoglobin 15.8, hematocrit 45.7, platelet of  134.  BMP with sodium of 138, potassium 3.9, chloride 107, bicarb 23,  BUN 11, creatinine 0.7, glucose of 109.  Calcium 9.0.  Albumin 3.3, AST  8.7, ALT 35, alkaline phosphatase 101.3.  UDS positive for cocaine and  marijuana.  Urinalysis positive for leukocyte, 3-6 wbc's and few  bacteria.  Beta hCG was negative.  Troponin less than 0.02, CK-MB 15.9,  CK total of 9.7.  ESR of 44.  Blood alcohol level less than 5.   ASSESSMENT:  1. Diffuse erythematous nodules. Query etiology.  However, the patient      claims this usually occurs whenever her white blood cell count is      low.  The patient was recently diagnosed with plasmacytoma, being      followed by Dr. Arbutus Ped, whose case has been discussed with already      in the ED who recommended the patient be admitted and placed on      empiric antibiotics and he will see the patient in the morning.  2. Urinary tract infection.  3. Leukopenia.  4. Atypical chest pain.  5. Cocaine abuse.  UDS positive for cocaine.  6. Mild rhabdomyolysis.   PLAN:  1. We will admit to inpatient.  2. Place on IV fluid rehydration.  3. We will place on IV empiric antibiotics with an vancomycin and      Primaxin until the patient is evaluated in the morning by Oncology.  4. Should the patient become neutropenic with absolute neutrophil      count less than 500, we will decided to place on neutropenic      precaution at that time and also would consider getting infectious      disease consultation if cultures come back positive for antibiotic      management.  5. 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  We      will recheck CBC in the morning.  6. We will also get a chest x-ray to rule out any possible pneumonia      given some nonspecific cough occasionally productive of whitish      sputum.  We will repeat panculture if the patient has any spike in      temperature.  7. We will  recheck CK total in the morning.  Continue with IV fluid  rehydration.  Prophylaxis, the patient has been placed on Lovenox.      Case and plan discussed extensively with the patient.  All      questions were answered.  She voiced understanding and is in      agreement with the plan of care.      Jude Enid Baas, MD  Electronically Signed     JO/MEDQ  D:  05/21/2009  T:  05/21/2009  Job:  [id number]

## 2011-02-28 NOTE — Discharge Summary (Signed)
NAMEMarland Kitchen  MONASIA, LAIR NO.:  [id number]   MEDICAL RECORD NO.:  [id number]          PATIENT TYPE:  INP   LOCATION:  5039                         FACILITY:  [facility]   PHYSICIAN:  Ruthy Dick, MD    DATE OF BIRTH:  December 06, 1955   DATE OF ADMISSION:  02/01/2009  DATE OF DISCHARGE:  02/09/2009                               DISCHARGE SUMMARY   REASON FOR ADMISSION:  Fever, abdominal pain of 2 days duration.   FINAL DISCHARGE DIAGNOSES:  1. Pyelonephritis.  2. Neutropenic fever.  3. Plasma cell dyscrasia.  4. Hepatitis C infection.  5. Low back pain.  6. Constipation.  7. Hypokalemia resolved.   PROCEDURES DONE DURING THIS ADMISSION:  CT scan of the L-spine which was  noted to have mild degenerative changes, no features suggestive of acute  diskitis, otherwise.   BRIEF HISTORY OF PRESENT ILLNESS AND HOSPITAL COURSE:  This is a 55-year-  old lady with hepatitis C and plasma cell dyscrasia who came to the  hospital with abdominal pain and was subsequently diagnosed with  pyelonephritis.  Urinalysis that grew E-coli which was also sensitive to  Cipro.  The patient was administered about 8 days of antibiotics.  The  patient has recovered well from this and she is stable enough for  discharge.  During this admission, the patient was also noted to have  neutropenia with no neutrophils in her blood altogether and because of  this, she was started on Neupogen with improvement of her white blood  cell count.  Today, her white blood cell count is 4.5 which is within  normal range and the patient is doing very well.  No complaints  whatsoever.  No abdominal pain, no nausea, no vomiting, no diarrhea, no  constipation.   Vitals today are temperature 97.0, pulse 95, respirations 20, blood  pressure 127/88, and saturating 96% on room air.   She is to go home on Cipro 500 mg p.o. b.i.d. for 5 days, Lortab 5/500  mg p.o. q.6 h. p.r.n. 20 tablets, Flexeril 10 mg p.o. t.i.d.  p.r.n. for  pain.   She is to follow up with Dr. Arbutus Ped, the hematologist as previously  scheduled and according to Dr. Arbutus Ped that is only next month.  The  patient is also to follow with her GI specialist for hepatitis C.  She  is to call and  reschedule appointment which she needs while she was in the hospital.  She is also to follow up with her primary care physician and Social  Service to help organize this.   TIME USED FOR DISCHARGE PLANNING:  Greater than 30 minutes.      Ruthy Dick, MD  Electronically Signed     GU/MEDQ  D:  02/09/2009  T:  02/10/2009  Job:  [id number]   cc:   Lajuana Matte, MD

## 2011-02-28 NOTE — Consult Note (Signed)
NAMEMarland Kitchen  Katrina Kelley, Katrina Kelley NO.:  [id number]   MEDICAL RECORD NO.:  [id number]          PATIENT TYPE:  INP   LOCATION:  5159                         FACILITY:  [facility]   PHYSICIAN:  Lajuana Matte, MD  DATE OF BIRTH:  17-Dec-1955   DATE OF CONSULTATION:  10/31/2008  DATE OF DISCHARGE:                                 CONSULTATION   REQUESTING PHYSICIAN:  Eduard Clos, M.D. from [facility] Team.   REASON FOR THE CONSULT:  A 55 year old African American female with  leukocytopenia and febrile neutropenia.   HISTORY:  Katrina Kelley is a very pleasant 55 year old African American  female with past medical history significant for bipolar disorder and  polysubstance abuse.  The patient was admitted to [facility]  today complaining of 1-week history of cough productive of blood-tinged  yellow sputum as well as fever and significant fatigue and weakness.  The patient also complained of a sore throat and mouth ulcer as well as  suprapubic dull aching pain.  She has been using Advil at home on an as-  needed basis for her fever with no improvement.  The patient has not  been on any new medication recently.  She has a history of intravenous  drug abuse in the past.  She currently uses crack cocaine and also she  has a history of alcohol abuse.  Her CBC in the emergency department  today showed total white blood count was 1100.  Her absolute neutrophil  count was 0.  She had a normal hemoglobin of 14.6, hematocrit was 43.0  and platelet count was also normal at 306.  Of note the patient had  similar episode of leukocytopenia and neutropenia in March 2009 when she  presented to the emergency department complaining of persistent diarrhea  and this resolved continuously after that.  When seen today the patient  feels fine except for the above complaints.  She denied having any  significant recent weight loss or night sweats, denied having any  bruises or bleeding.  I  examined her peripheral blood smear today and  except for the marked leukocytopenia and neutropenia, there were no  other significant abnormalities.   REVIEW OF SYSTEMS:  Today she continues to have fever, no chills.  No  headache, no blurry vision or double vision.  She denied having any  chest pain or shortness of breath but she had cough productive of blood-  tinged yellowish sputum.  The patient denied having any syncope or  palpitations.  No nausea, vomiting or abdominal pain, diarrhea,  constipation, melena or hematochezia.  No dysuria or hematuria.   PAST MEDICAL HISTORY:  Significant for:  1. History of bipolar disorder.  2. History of polysubstance abuse.  3. 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   FAMILY HISTORY:  Unremarkable.  Mother is healthy.  Father died from TB  complications.   SOCIAL HISTORY:  She is separated for more than 20 years.  The patient  is currently on disability.  She has 5  children.  The patient has a  history of smoking 1 pack per day for close to 40 years.  She also has a  history of alcohol abuse as well as polysubstance abuse including  intravenous drug abuse and currently crack cocaine.   ALLERGIES:  She is allergic to PENICILLIN and DEMEROL.   CURRENT HOME MEDICATIONS:  None.   PHYSICAL EXAM:  Blood pressure 120/78, pulse 110, respiratory rate 18,  temperature 99.4, oxygen saturation 99% on room air.  GENERAL EXAM:  Showed very pleasant 55 year old [place] American female,  awake, alert, in no acute distress.  HEENT:  Normocephalic, atraumatic, and the oropharynx showed an ulcer in  the inner side of her lower lip bilaterally.  NECK EXAM:  Supple.  No palpable lymphadenopathy.  CHEST EXAM:  Clear to auscultation.  No wheezes or crackles.  CARDIOVASCULAR EXAM:  Normal S1-S2, no murmur or gallops.  ABDOMINAL EXAM:  Soft, nontender, nondistended.  No masses.  EXTREMITIES:   Show no edema.   LABORATORY DATA:  CBC today showed white blood count 1.1, hemoglobin  17.5, hematocrit 40.0, platelets 306, absolute neutrophil count 0.  Sodium was 134, potassium 4.1, glucose 105, BUN 7, creatinine 0.6.   IMAGING STUDIES:  Chest x-ray today showed emphysema without acute  disease.   ASSESSMENT AND PLAN:  This is a pleasant 55 year old [place] American  female presented with leukocytopenia and febrile neutropenia.  This  could be either drug-induced from the use of Advil last week versus  viral infection, especially human immunodeficiency virus with her  history of intravenous drug abuse in the past versus acute leukemia.  I  had a lengthy discussion with the patient today about her condition and  further investigation needed to confirm her etiology of the  leukocytopenia and neutropenia.  My recommendation is to:  1. Discontinue any NSAID use at this point.  2. Check serology for human immunodeficiency virus and hepatitis.  3. Check LDH.  4. I would consider the patient for bone marrow biopsy and aspirate on      Monday to rule out acute leukemia or lymphoma.  5. Will continue the patient on neutropenic diet and precautions.  6. Continue antibiotics in the form of Primaxin.  7. I would consider starting the patient on Neupogen 480 subcu qd for      the next 2 days until her bone marrow biopsy results become      available.  If the biopsy confirms the diagnosis of acute leukemia,      I would discontinue the Neupogen, as it may increase the number of      blasts in her blood.  I will continue to follow up the patient with      you.   Thank you so much for allowing me to participate in the care of Mrs.  Katrina Kelley.      Lajuana Matte, MD  Electronically Signed     MKM/MEDQ  D:  10/31/2008  T:  10/31/2008  Job:  [id number]   cc:   Eduard Clos, MD  Dineen Kid Reche Dixon, M.D.

## 2011-02-28 NOTE — H&P (Signed)
NAME:  Katrina Kelley, Katrina Kelley NO.:  [id number]   MEDICAL RECORD NO.:  [id number]          PATIENT TYPE:  EMS   LOCATION:  MAJO                         FACILITY:  [facility]   PHYSICIAN:  Eduard Clos, MDDATE OF BIRTH:  1956/07/21   DATE OF ADMISSION:  10/31/2008  DATE OF DISCHARGE:                              HISTORY & PHYSICAL   PRIORITY ADMISSION HISTORY AND PHYSICAL   PRIMARY CARE PHYSICIAN:  Dr. Reche Dixon at [facility].   CHIEF COMPLAINT:  Coughing up blood.   HISTORY OF PRESENTING ILLNESS:  A 55 year old female with a history of  polysubstance abuse, bipolar disorder off medication for the last couple  of months, presented here complaining of coughing up blood.  In  addition, the patient also has complaints of generalized weakness with  some vaginal discharge.  The patient states that she has been having  some cough with productive sputum over the last 1 week wherein she found  some blood-tinged sputum and had also been feeling very fatigued.  In  addition, the patient also is complaining of some yellowish vaginal  discharge.  A pelvic exam was done by the ER PA, who found some  yellowish discharge with some clue cells.  In addition, patient also has  been having some white spots in the mouth, which has been growing the  last week or 2.  Denies any difficulty swallowing, denies any chest  pain, denies any shortness of breath, nausea, vomiting, diarrhea, has  fatigue and at times felt feverish.  Denies any headache, loss of  consciousness, weakness of limbs.   PAST MEDICAL HISTORY:  1. Admitted in the hospital in November for similar complaints wherein      she had some cough and productive sputum with some blood-tinged      sputum.  2. History of bipolar disorder off medication; presently has no      suicidal ideations.   PAST SURGICAL HISTORY:  Hysterectomy.   MEDICATIONS PRIOR TO ADMISSION:  None.   ALLERGIES:  1. PENICILLIN, which caused a rash.  2.  DEMEROL.   FAMILY HISTORY:  Nothing contributory.   SOCIAL HISTORY:  The patient states she quit smoking last week.  Drinks  alcohol off and on, however, no specific frequency.  Used to use IV  drugs 10 years ago and states she quit, still uses sometimes off and on  crack cocaine.   REVIEW OF SYSTEMS:  As per in the History of Presenting Illness; nothing  else significant.   PHYSICAL EXAMINATION:  GENERAL:  The patient examined at the bedside not  in acute distress.  VITAL SIGNS:  Blood pressure is 108/60, pulse 100 per minute,  temperature 100.4, respiration 20 per minute, O2 SAT 98%.  HEENT:  Anicteric and no pallor.  There are white areas in her palate,  and the left vestibule of the mouth.  CHEST:  Bilateral air entry present, no rhonchi, no crepitation.  HEART:  S1 S2 heard.  ABDOMEN:  Soft and nontender, bowel sounds heard.  CNS:  Alert, awake, oriented to time, place, and person, moves upper and  lower extremities 5/5.  EXTREMITIES:  Peripheral pulses felt, no edema.   LABORATORY DATA:  CBC:  WBC is 1.1, hemoglobin 14.6, hematocrit 43,  platelets 306, neutrophils 4%, and lymphocytes 86%.  Basic metabolic  panel:  Sodium 134, potassium 4.1, chloride 100, glucose 105, BUN 7,  creatinine 0.6.  Pregnancy screen is negative.  A UA is showing ketones  15, nitrites positive, WBC 3 to 6, bacteria many, protein 30.  Chest x-  ray shows emphysematous changes.   ASSESSMENT:  1. Febrile neutropenia.  2. Bacterial vaginosis.  3. Old thrush versus leukoplakia.  4. Hemoptysis.  5. History of polysubstance abuse.  6. Urinary tract infection.   PLAN:  We will admit patient to medical floor, place the patient on  neutropenic and respiratory precautions.  At this time, I will add  Primaxin for her febrile neutropenia, Flagyl for her bacterial  vaginosis, and Diflucan for a possible oral thrush.  The patient has  consented for HIV test.  We will do systemic test.  I have discussed   with Dr. Arbutus Ped, Hematologist, who advised the patient to get a bone  marrow with a flow cytometry, morphology, and cytogenetics through  interventional radiologist, and if patient has persistent leukopenia and  if any of these tests are becoming positive, to call the hematologist  again, and further recommendations as condition evolves.      Eduard Clos, MD  Electronically Signed     ANK/MEDQ  D:  10/31/2008  T:  10/31/2008  Job:  [id number]

## 2011-02-28 NOTE — Discharge Summary (Signed)
NAMELATICHA, FERRUCCI NO.:  [id number]   MEDICAL RECORD NO.:  [id number]          PATIENT TYPE:  INP   LOCATION:  5532                         FACILITY:  [facility]   PHYSICIAN:  Madaline Guthrie, M.D.    DATE OF BIRTH:  1956/06/09   DATE OF ADMISSION:  02/28/2009  DATE OF DISCHARGE:  03/08/2009                               DISCHARGE SUMMARY   DISCHARGE DIAGNOSES:  1. 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  White blood cell on admission 2.8 with ANC      0.1, on discharge afebrile with white blood cell count 4.8.  2. Cough with sinus congestion - most likely secondary to upper      respiratory tract infection, resolved.  3. Nausea, vomiting with diarrhea - also on initial presentation,      likely secondary to viral etiology, resolved.  4. Hypokalemia - likely secondary to #3, repleted and at baseline on      discharge.  5. History of bipolar and depression, stable, not on any medicines.      No SI during hospitalization.  6. Polysubstance abuse, alcohol and cocaine.  7. Homelessness.  8. 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      Shirline Frees.  9. Hepatitis C.  10.Chronic thrombocytopenia with baseline platelets in 110s.   DISCHARGE MEDICATIONS:  1. Protonix 40-mg tablet once daily.  2. Thiamine 100-mg tablet once daily.  3. Folic acid 1 mg tablet once daily.  4. Avelox 400-mg tablet once daily.   DISPOSITION AND FOLLOW UP:  The patient was discharged from the hospital  in stable condition afebrile with white blood cell count within normal  limits.  The patient will follow up with Dr. Arbutus Ped May 25 at 1:00 p.m.  at the [facility].  This discharge summary will be sent to  Dr. Arbutus Ped.  On follow-up appointment, the patient should have CBC with  differential checked to ensure that white blood cell is at  baseline and  within normal limits.  The patient was discharged on prophylactic  antibiotics and will defer to Dr. Shirline Frees for decision in terms of  duration of antibiotic treatment.  On discharge day, the patient will  have day 5 of Avelox completed.  In addition, the patient has received 3  days' worth of vancomycin and Elita Quick, first 3 days during the  hospitalization.  The patient will also follow up with Dr. Reche Dixon on  June 18 at [facility] at 11:15 a.m.  At follow-up appointment, please  assess if the patient has followed up with hepatitis clinic for further  evaluation and management.  Patient has an appointment with the  pathologist on May 26 at [facility], and the patient was  provided direction to all her appointments as well as phone numbers.  The patient will be discharged to skilled nursing facility.  On follow-  up, abstinence from alcohol and cocaine can be assessed as well.   PROCEDURES:  Feb 28 2009, chest  x-ray:  Stable appearance of the chest,  chronic COPD changes, no acute findings.  Mar 02, 2009, x-ray of the sinuses:  Negative for sinus disease,  postoperative changes of plate and screw fixation of the right frontal  bone noted.   CONSULTATIONS:  None.   HISTORY OF PRESENT ILLNESS:  The patient is a 55 year old female with  major depression, bipolar disorder, recurrent neutropenic fevers  secondary to plasmacytosis, hepatitis C who presents to [facility] with  3-day history of fever of 100.4 Fahrenheit associated with painful  productive cough of thick white sputum, congestion in the whole face,  nausea, yellowish vomiting, diarrhea.  Has tried Tylenol and Advil with  not a lot of relief.  Denies sick contact exposure but just recently  left shelter, moved in with few friends.  No recent appetite change or  weight changes.   VITALS:  Temperature 101.1, blood pressure 94/62, pulse 90, respirations  20, saturation 96% on room air.   PHYSICAL  EXAMINATION:  Sitting in bed.  Appears anxious.  Muddy sclera.  EOMI, PERRLA.  Oral thrush.  Poor dental care.  No postnasal drip.  Frontal and maxillary sinus tenderness.  NECK:  Supple.  Full range of motion.  LUNGS:  Clear to auscultation bilaterally with good air movement.  No  wheezing, crackles or rhonchi.  HEART:  Rate and rhythm regular, S1-S2 present.  No murmurs or JVD.  ABDOMEN:  Soft, nontender, nondistended.  Bowel sounds present.  No  guarding.  EXTREMITIES:  No edema or cyanosis.  LYMPH:  No palpable cervical, auricular, preauricular or postauricular  lymphadenopathy.  No axillary lymphadenopathy.  NEUROLOGICAL:  Alert and oriented x3, nonfocal.  PSYCH:  Anxious, short attention span.   LABORATORY DATA:  Sodium 133, potassium 3.2, chloride 102, bicarb 22,  BUN 9, creatinine 0.65, glucose 131, albumin 2.5.  WBC 2.8, ANC 0.1, hemoglobin 13.4, MCV 88, platelets 161.   HOSPITAL COURSE BY PROBLEM:  1. Neutropenic fever, recurrent since January 2010, secondary to      plasmacytosis, initially unclear source of infection.  The patient      did not appear septic and was started on broad spectrum IV      antibiotics, vancomycin and Ceftin.  She has completed 3 days of      those antibiotics and eventually switched to p.o. Avelox.  The      patient remained afebrile during the hospitalization with white      blood cell count ranging from 2.8 and eventually stabilizing at      4.8.  ANC remained on low side at 0.1 to 0.2.  The patient has      received a total of 3 injections of Neupogen, last one being given      on May 21.  Blood cultures, urine cultures all negative to date.      Chest x-ray showed no acute cardiopulmonary abnormalities.  Sputum      culture did grow moderate group strep B.  However, group A strep      test was negative.  2. Cough and sinus congestion - most likely secondary to upper      respiratory tract infection.  X-ray of the sinuses was clear with       no sinus disease evident.  Congestion eventually improved and      completely resolved by date of discharge.  Cough resolved as well.  3. Nausea, vomiting, diarrhea.  Also likely viral in etiology.  Clostridium difficile was negative.  The patient was started on      Protonix and eventually was able to tolerate solids and liquids.      All three symptoms resolved by discharge.  4. Wound hypokalemia secondary to #3.  Repleted and stable on      discharge.  Potassium on discharge 4.1.  5. Hypertension secondary to dehydration from the #3.  The patient was      hydrated well.  Blood pressure remained stable during the      hospitalization, 130s/80s.  6. PSA.  UDS positive for cocaine, alcohol level undetectable.      Counseling for cessation was provided and can be followed up in      outpatient setting.  7. Homelessness.  Social worker consulted for placement.  The patient      will be placed in nursing home temporarily, and plan is to assist      the patient with further placement once she leaves the SNF.   VITALS ON DISCHARGE:  Temperature 97.4, pulse 65, respirations 16, blood  pressure 126/83, saturating 94-96 on room air.   LABORATORY DATA:  Labs drawn on May 23:  CBC - WBC 4.8, hemoglobin 14.5,  MCV 90, platelets 207, ANC 1.0.  Sodium 137, potassium 4.1, chloride  103, bicarb 26, glucose 98, BUN 3, creatinine 0.59 and calcium 8.8.   Over 30 minutes was spent on discharging the patient.      Mliss Sax, MD  Electronically Signed      Madaline Guthrie, M.D.  Electronically Signed    IM/MEDQ  D:  03/08/2009  T:  03/08/2009  Job:  [id number]   cc:   Lajuana Matte, MD

## 2011-02-28 NOTE — Discharge Summary (Signed)
NAMEMarland Kitchen  EMERA, BUSSIE NO.:  [id number]   MEDICAL RECORD NO.:  [id number]          PATIENT TYPE:  INP   LOCATION:  6740                         FACILITY:  [facility]   PHYSICIAN:  Hillery Aldo, M.D.   DATE OF BIRTH:  Apr 08, 1956   DATE OF ADMISSION:  05/20/2009  DATE OF DISCHARGE:  05/28/2009                               DISCHARGE SUMMARY   PRIMARY CARE PHYSICIAN:  Dineen Kid. Reche Dixon, MD   HEMATOLOGIST:  Lajuana Matte, MD   DISCHARGE DIAGNOSES:  1. Right lower lobe pneumonia.  2. Neutropenia secondary to plasmacytosis.  3. Hepatitis C complicated by plasmacytosis.  4. Thrombocytopenia.  5. Cocaine abuse.  6. Sick euthyroid syndrome.  7. Bipolar disorder.  8. Emphysema.   DISCHARGE MEDICATIONS:  1. Avelox 400 mg daily x3 more days.  2. Neupogen 480 mcg subcutaneously daily until absolute neutrophil      count greater than 1000.  3. Mucinex 600 mg p.o. b.i.d. p.r.n. chest congestion.  4. Multivitamin p.o. daily.  5. Neosporin to hand pustules t.i.d.   CONSULTATIONS:  Lajuana Matte, MD, Hematology.   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 Arbutus Ped who presented to the  hospital with a chief complaint of fevers, chills, cough, chest pain,  and myalgia.  Upon initial evaluation in the emergency department, she  was noted to be neutropenic with evidence of pneumonia.  She  subsequently was referred to the Hospitalist Service for further  evaluation and treatment.  For full details, please see the dictated  report done by Dr. Enid Baas.   PROCEDURES AND DIAGNOSTIC STUDIES:  Chest x-ray on May 21, 2009,  showed emphysema.  Right lower lobe air space disease worrisome for  pneumonia.   DISCHARGE LABORATORY VALUES:  Blood cultures were negative.  Free T4 is  1.22.  Hepatitis C antibody was reactive.  CBC on May 26, 2009,  showed a white blood cell count of 3.5, hemoglobin 15, hematocrit 44.2,  platelets 128 with an absolute neutrophil count of 700.  CBC done today  pending.  Chemistries showed sodium of 138, potassium 3.7, chloride 109,  bicarb 22, BUN 9, creatinine 0.60, glucose 104, total bilirubin 0.5,  alkaline phosphatase 111, AST 51, ALT 30, total protein 9, albumin 3,  calcium 8.7.  Urine drug screen was positive for cocaine and  tetrahydrocannabinol.   HOSPITAL COURSE:  1. Right lower lobe pneumonia:  The patient has completed 7 days of      treatment out of a planned course of 10 days of therapy with      antibiotics.  Consideration for expanding her treatment course can      be made by her outpatient treating physician if she remains      neutropenic.  The patient currently is asymptomatic.  2. Neutropenia:  The patient was seen in consultation with Dr. Arbutus Ped      who recommended Neupogen until her neutrophil count was greater      than 1000.  We  would recommend ongoing CBC with differential checks      until her counts stabilize.  3. Thrombocytopenia:  The patient has a mild persistent      thrombocytopenia, but no active signs or symptoms of bleeding.  4. Hepatitis C/plasmacytosis:  The patient should follow up at [facility].  A      referral form has been filled out and this should be sent with her      and faxed to [facility] so that appropriate followup can be arranged.  5. Cocaine abuse.  The patient was provided with Ativan p.r.n.  She      was counseled.  6. Sick euthyroid syndrome:  The patient's TSH was slightly low, but      her free T4 was within normal limits.  We would recommend      outpatient thyroid studies in 6 weeks.  7. Bipolar disorder:  The patient is currently stable.  She is not      receiving any psychotropics.  Her mood has not shown any signs or      symptoms of decompensation.   DISPOSITION:  The patient is medically stable and will be discharged  back to [facility]  today.  She should follow up with Dr. Arbutus Ped in 2-3  weeks.  She should follow up with her primary care physician as needed.   CONDITION AT DISCHARGE:  Improved.   Time spent coordinating care for discharge and discharge instructions  equals 35 minutes.      Hillery Aldo, M.D.  Electronically Signed     CR/MEDQ  D:  05/28/2009  T:  05/28/2009  Job:  [id number]

## 2011-02-28 NOTE — Op Note (Signed)
NAMEMarland Kitchen  Katrina, Kelley NO.:  [id number]   MEDICAL RECORD NO.:  [id number]          PATIENT TYPE:  INP   LOCATION:  5159                         FACILITY:  [facility]   PHYSICIAN:  Lucky Cowboy, MD         DATE OF BIRTH:  1956-09-09   DATE OF PROCEDURE:  11/08/2008  DATE OF DISCHARGE:                               OPERATIVE REPORT   PREOPERATIVE DIAGNOSES:  1. Left lateral lip ulcer.  2. Right giant hard palate ulcer.  3. Gingival hyperplasia.   POSTOPERATIVE DIAGNOSES:  1. Left lateral lip ulcer.  2. Right giant hard palate ulcer.  3. Gingival hyperplasia.   PROCEDURE:  1. Excisional biopsy of left lateral upper lip ulcer.  2. Incisional biopsy of right hard palate ulcer.  3. Incisional biopsy of right maxillary gingival hyperplasia.   SURGEON:  Lucky Cowboy, MD   ANESTHESIA:  General.   ESTIMATED BLOOD LOSS:  Less than 50 mL.   SPECIMENS:  1. Left upper lip ulcer sent for      a.     Pemphigus vulgaris and saline solution.      b.     Specimen sent in formalin for other analysis.  2. Right hard palate ulcer.      a.     Saline for pemphigus vulgaris.      b.     Sent in formalin.      c.     Culture for aerobic and anaerobic.      d.     Viral culture for herpes.  3. Right maxillary gingiva.      a.     Saline.      b.     Formalin.   COMPLICATIONS:  None.   INDICATIONS:  This patient is a 55 year old female with two large ulcers  that have been present now for 2 weeks.  Concerns are for herpes,  Behcet's, Wegener's and pemphigus along with others.  The patient is now  neutropenic.  The patient also has vaginal ulcers.  She has a history of  polysubstance abuse.  She did come in initially with elevated  temperature over 102.  This has been relieved with Primaxin and  vancomycin.  For these reasons, the patient is in need of assistance  with diagnosis.  Additionally, the patient is noted to have hepatitis C.  Biopsies are obtained today  intraoperatively.   FINDINGS:  The patient was noted to have healing ulcers in the left  upper lateral lip of approximately 8-10 mm.  There was a large ulcer of  the right lateral posterior hard palate adjacent to the gingiva.  This  was approximately 1.5 to 2 cm.  Incisional biopsy was obtained.  There  was diffuse gingival hyperplasia particularly adjacent to the dentition.   PROCEDURE:  The patient was taken to the operating room and placed on  the table in the supine position.  She was then placed under general  endotracheal anesthesia.  Examination of the oral cavity was performed  under direct visualization using the headlight.  Lidocaine 1% with  1:100,000 of epinephrine was  used to inject the three areas of concern.  The gingiva between the central incisor and the lateral incisor and also  between the lateral right maxillary incisor and the canine tooth were  injected as these were the two areas of gingival biopsy.  After allowing  time for vasoconstrictive effect, an excisional biopsy was performed of  the left lateral lip.  This was sent as previously specified.  It was  sectioned so that moderate portion was sent in saline for pemphigus  vulgaris immunofluorescence while the rest was sent in formalin.  The  edges were reapproximated in a simple interrupted fashion using 5-0  chromic.   Attention was then turned to the large right hard palate ulcer.  An  incisional biopsy of approximately 1.5 cm in dimension was then  performed using a #15 blade.  A normal edge was obtained.  This was then  felt to be the best to sent for cultures.  Intraoperative consultation  was obtained with pathologist, Dr. Pecola Leisure.  He suggested sending a  portion in saline, formalin, portion and culture for aerobic and  anaerobic as well as a herpetic culture which was all performed as  previously discussed in the findings and specimens sections.  Cauterization was required using Bovie cautery.   Next, incisional biopsy  was performed between the right maxillary central incisor and lateral  incisor followed by a similar incisional biopsy in vertical fashion  between the lateral incisor and the canine tooth.  Cautery was used for  hemostasis.  The mouth and wounds were all cleaned with saline.  The  oral cavity was suctioned and an NG tube placed down the oral cavity  with bile being suctioned from the stomach.  Bacitracin ointment was  applied to the lips.  The patient was then awakened from anesthesia and  taken to the postanesthesia care unit in stable condition.  There were  no complications.      Lucky Cowboy, MD  Electronically Signed     SJ/MEDQ  D:  11/08/2008  T:  11/08/2008  Job:  [id number]   cc:   [facility]  [facility]

## 2011-02-28 NOTE — Discharge Summary (Signed)
NAMEMarland Kitchen  Katrina, Kelley NO.:  [id number]   MEDICAL RECORD NO.:  [id number]          PATIENT TYPE:  OBV   LOCATION:  4714                         FACILITY:  [facility]   PHYSICIAN:  Alvester Morin, M.D.  DATE OF BIRTH:  29-Dec-1955   DATE OF ADMISSION:  08/23/2008  DATE OF DISCHARGE:  08/24/2008                               DISCHARGE SUMMARY   ATTENDING PHYSICIAN:  Ileana Roup, MD   DISCHARGE DIAGNOSES:  1. Upper respiratory tract infection.  2. Hypotension.  3. Hyperthyroidism.  4. Bipolar disorder.  5. Depressive disorder.   DISCHARGE MEDICATIONS:  1. Depakote 500 mg daily.  2. Avelox 400 mg daily x4 days.  3. Tamiflu 75 mg b.i.d. x3 days.  4. Tussionex 5 mL 2 times a day as needed for cough.   CONDITION AT DISCHARGE:  The patient on discharge was feeling much  better and felt ready to go home.  We will have the patient continue  with Avelox and Tamiflu for cumulative treatment of 5 days.  We will  have the patient continue with Tussionex.  We will schedule an followup  appointment with Dr. Reche Dixon.  The patient should have T4 checked to see  if her hyperthyroidism is clinical or subclinical.  The patient also had  elevated AST, we would have a repeat liver function enzymes done on an  outpatient basis.   PROCEDURES:  Chest x-ray, November, 8, 2009.  Impression: Stable changes of COPD and chronic bronchitis.  No acute  abnormality.   ADMITTING HISTORY AND PHYSICAL:   HISTORY OF PRESENT ILLNESS:  A 55 year old woman with past medical  history of COPD that presents to the emergency room with cough, chest  pain, generalized fatigue, and fevers.  The patient states that symptoms  started 2 days ago.  Cough is producing yellow sputum with red spots.  Cough is intermittent and produced chest pain with prolonged coughing.  The patient also feels the chest pain with deep breathing.  The patient  stated she is living in a shelter and most of her neighbors are  sick.  The patient complains of nausea, but no vomiting.  The patient denies  palpitations, diaphoresis, chills, night sweats, shortness of breath, or  weight loss.   PHYSICAL EXAMINATION:  VITAL SIGNS:  Temperature 98.3, blood pressure  93/69, pulse 87, respiratory rate 16, oxygen saturation 95 on room air.  GENERAL:  NAD.  HEENT:  Clear.  No thrush.  NECK:  Supple.  No JVD.  RESPIRATORY:  Clear to auscultation bilaterally.  No wheezes.  No  crackles.  CARDIOVASCULAR:  Regular rate and rhythm.  No murmurs.  No rubs.  No  gallops.  GASTROINTESTINAL:  Soft.  Bowel sounds diminished.  Nontender,  nondistended.  EXTREMITIES:  No edema.  MUSCULOSKELETAL:  The patient is moving all extremities equally.  NEUROLOGIC:  Nonfocal.   LABORATORIES:  White blood cell 4.1, hemoglobin 13.7, hematocrit 40.7,  and platelets 155.  Sodium 138, potassium 4.0, chloride 107, bicarb 23,  BUN 15, creatinine 0.53, glucose 87, AST 39, ALT 26, protein 7.7,  albumin 3.4, calcium 9.0, point-of-care cardiac enzymes negative,  TSH  0.261, Depakote level less than 10.   HOSPITAL COURSE:  1. Upper respiratory tract infection.  The patient is presenting with      a picture of viral bronchitis.  The patient was admitted for      observation.  The patient was started on Tussionex for cough,      Tylenol for her pain, Tamiflu for possible H1N1, Avelox.  Upon next      day of admission, the patient's oxygen saturation was 99 on room      air and the patient was feeling much better.  It was clinically      thought that the patient was stable to be discharged.  The patient      was discharged on Avelox, which she should continue for 4 days and      Tamiflu, which she should continue for 3 days.  The patient will      follow up with the primary care Christyana Corwin.  The patient has      responded well to treatment.  2. Hypotension.  Most of the hypotension was due to dehydration.  The      patient's hypotension resolved with  hydration.  The patient while      at the hospital was given normal saline.  The patient has responded      well to treatment.  3. Hyperthyroidism.  Upon checking of TSH it was found that it was      low.  The patient should have T4 levels to see whether      hyperthyroidism is clinical or subclinical.  4. Bipolar disease.  On admission, the patient was continued with her      home dose medication of Depakote.  While in the hospital, levels      were subtherapeutic.  Upon outpatient basis, the patient should      have Depakote dose adjusted.   DISCHARGE LABORATORIES:  White blood cells 5.9, hemoglobin 13.3,  hematocrit 39.1, platelets 153.  Sodium 134, potassium 4.3, chloride  108, bicarb 24, glucose 127, BUN 13, creatinine 0.55, calcium 8.7.   DISCHARGE VITALS:  Temperature 97.4, blood pressure 138/106, pulse 84,  respiratory rate 20, oxygen saturation 99 on room air.      Katrina Harbor, MD  Electronically Signed      Alvester Morin, M.D.  Electronically Signed    RV/MEDQ  D:  08/25/2008  T:  08/26/2008  Job:  [id number]

## 2011-02-28 NOTE — Discharge Summary (Signed)
NAMESHAKIRA, LOS NO.:  [id number]   MEDICAL RECORD NO.:  [id number]          PATIENT TYPE:  INP   LOCATION:  5159                         FACILITY:  [facility]   PHYSICIAN:  Richarda Overlie, MD       DATE OF BIRTH:  Oct 26, 1955   DATE OF ADMISSION:  10/31/2008  DATE OF DISCHARGE:  11/09/2008                               DISCHARGE SUMMARY   DISCHARGE DIAGNOSES:  1. Neutropenic fever.  2. Bacterial vaginosis.  3. Hepatitis C.  4. Multiple myeloma.  5. Polysubstance abuse.   SUBJECTIVE:  1. This is a 55 year old female who presented to the ER with      leukopenia, febrile neutropenia.  She has a history of bipolar      disorder, polysubstance abuse, and presented to the [facility] ER      with a 1-week history of a cough productive of blood-tinged yellow      sputum as well as fever and significant fatigue and weakness.  The      patient complained of sore throat and ulcers in her mouth and      suprapubic dull aching pain.  The patient had been using Advil      without any significant improvement.  She denied being started on      any new medications.  The patient has not been on any new      medications recently.  She does have a history of IV drug abuse in      the past, and uses crack cocaine and also history of alcohol abuse.   In the ER the patient was found to be neutropenic with a total white  blood cell count of 1100.  Her absolute neutrophil count was 0.  The  patient was found to have a low grade fever  of 99.4.  In the ER the  patient's chest x-ray was negative.  She was evaluated for an underlying  viral infection, drug-induced neutropenia, HIV, acute leukemia, etc.  Her HIV ELISA was negative in consultation with  hematology/oncology and  Dr.Mohamed was consulted.  The patient had extensive workup, and was  ruled out for HIV.  She was also evaluated for group A strep which was  negative.  RPR was negative.  EBV was negative.  The patient received 2  injections of Neupogen.  Unfortunately she remained neutropenic during  the rest of her hospitalization.  Because of an unclear source of an  infection she was started on broad-spectrum antibiotics with vancomycin  and Primaxin.  A bone marrow biopsy was recommended, and interventional  radiology guided bone marrow biopsy was done on the 18th that showed  plasma cell dyscrasia,  possibly multiple myeloma with a final bone  marrow biopsy showing more than 30% plasma cells.  She also was found to  have elevated immunoglobulin G levels.  Her immunoglobulin  electrophoresis showed a serum IgG level of 2730, IgA of 523, and an IgM  of 388.  The patient also had a hepatitis panel done because of history  of past drug abuse, IV drug use, and HCV antibody screen was found  to be  positive.  Subsequent to that she had an HCV RNA  quantitative viral  load that showed that she had HCV RNA viral load of 757,000.  As per GI  and infectious disease this needs to be evaluated probably at [facility], and  she is not a candidate for any aggressive treatment of hepatitis C at  this point because of her underlying neutropenia and anemia which needs  to be addressed before her HCV can be treated any further.  The  patient's liver function was found to be within normal limits. For  neutropenic fever the patient was empirically started on vancomycin and  Primaxin up until the 25th, and then the vancomycin was discontinued.  The patient's cultures remained negative.  She had multiple cultures  done between the 17th and the 24th of January.  Urine culture from the  17th remained negative.  Genital culture showed normal vaginal flora.  Blood cultures drawn on the 16th, the 22nd, and 18th remained negative  to date.  The patient is being continued on Avelox 400 mg for 7 days on  recommendations by Dr. Sampson Goon.  She is to continue with neutropenic  precautions.  Instructions for this were provided to the patient.   1.  Multiple myeloma.  This needs to be evaluated further.  Dr. Arbutus Ped      has recommended a follow-up appointment in 7-10 days at the cancer      center which has been set up tentatively for February 2.   1. Oral and vaginal ulcers.  The patient has had a workup for this      including an HSV PCR of her vaginal ulcers which was negative.  She      was considered for Bechect's syndrome, Wegener's, pemphigus,      ulcerative colitis and Crohn's disease.  The patient had a biopsy      of her oral lesions done on the 24th and an excisional biopsy of      her left lateral, upper left ulcer and an incisional biopsy of the      right hard palate, also an incisional biopsy of the right maxillary      gingival hyperplasia was done by Dr. Lucky Cowboy.  Pathology on this      is still pending at this time.  Dr. Larae Grooms pager  number is [phone].  She recommended a follow-up appointment with her in 7-10      days.  In the meantime the patient will continue with benzocaine      ointment. She was begun empirically on Flagyl for a week for      treatment of possible Trichomoniasis.   FOLLOW-UP APPOINTMENTS:  Follow up with ENT, Dr. Christella Hartigan, phone number  [phone] or [phone]. ( Patient to call if she does not hear back in 7-  10 days. )   Follow up with hematology/oncology, Dr. Arbutus Ped, on February 2 at 1:30  p.m. at the cancer center on [address] phone number [phone]  [phone].  PT/OT consultation was obtained, and the patient was considered stable  for discharge at this time.   DISCHARGE MEDICATIONS:  1. Avelox 400 mg p.o. x7 days.  2. Benzocaine 1 application topical q.i.d. to the mouth.  3. Also folic acid 1 mg p.o. daily.  4. Protonix 40 mg p.o. daily.  5. Thiamine 100 mg p.o. daily.  6. Oxy-IR 5 mg p.o. q.6 hours p.r.n.  7. Cepacol  lozenges over-the-counter.  8. Bacitracin ointment topical to the lip  t.i.d.  9. Albuterol MDI 2 puffs q.4 hours p.r.n. shortness of  breath.      Richarda Overlie, MD  Electronically Signed     NA/MEDQ  D:  11/10/2008  T:  11/10/2008  Job:  [id number]

## 2011-02-28 NOTE — Discharge Summary (Signed)
NAMEMarland Kitchen  Katrina, Kelley NO.:  [id number]   MEDICAL RECORD NO.:  [id number]          PATIENT TYPE:  INP   LOCATION:  4711                         FACILITY:  [facility]   PHYSICIAN:  Mick Sell, MD DATE OF BIRTH:  04/03/1956   DATE OF ADMISSION:  04/01/2009  DATE OF DISCHARGE:  04/06/2009                               DISCHARGE SUMMARY   ATTENDING PHYSICIAN:  Dr. Clydie Braun MD.   DISCHARGE DIAGNOSES:  1. Neutropenic fever - unclear etiology, resolved on discharge.      Hematology/oncology following.  2. 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  3. Vomiting and diarrhea, chronic, previous admission in May 2010,      resolved on discharge.  4. Polysubstance abuse, cocaine and alcohol, with cessation      consultation provided.  5. Thrombocytopenia, chronic, baseline platelets at around 80.  6. History of bipolar depression, stable.  7. Hepatitis C.  8. 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  9. Recent hospitalization in May 2010, upper respiratory infection,      admission for neutropenic fever.   DISCHARGE MEDICATIONS:  1. Avelox 400 mg tablet once daily for 5 days.  2. Folic acid 1 mg once daily.  3. Thiamine 100 mg once daily.  4. Pepcid 20 mg twice daily.  5. Mylanta as needed.  6. Percocet 5/325 mg every 6 hours as needed for pain.   The patient was discharged from the hospital in stable condition with a  white blood cell count of 4.7 and ANC count of 1800.  She will follow up  with Dr. Arbutus Ped, her oncologist, on April 07, 2009 where she is  scheduled to receive another dose of Neupogen.   RECOMMENDATIONS:  Dr. Arbutus Ped is to help arrange appointment with  specialist at [facility] and patient should follow up after the  discharge.   CONSULTATIONS:  Dr. Arbutus Ped, oncology.   PROCEDURES:  April 01, 2009 CXR - without acute findings, only emphysema.  No changes compared to the  previous x-ray.   HISTORY OF PRESENT ILLNESS:  The patient is a 55 year old female with  recurrent neutropenic fever, diagnosis of plasmacytosis secondary to  hepatitis January 2010, presents to [facility] ED with main concern of  sore throat, fever that started 1-2 days prior to admission associated  with difficulty swallowing, hemoptysis, vomiting and watery diarrhea.  Mid epigastric and epigastric pain, sharp to dull, 10/10 in severity,  nonradiating, no specific alleviating or aggravating factors.  Denies  other abdominal or urinary concerns.  She had left the SNF after 1 week  of living there, lives with friends currently, denies sick contacts or  exposures.  Denies recent traveling, reports last use of cocaine a week  prior to admission.   PHYSICAL EXAM:  Temperature 103.6, blood pressure 127/88, pulse 131,  respirations 28, saturating 93% on room air.   GENERAL:  Anxious appearing, not in acute distress.  HEENT:  Muddy sclerae.  No icterus.  Conjunctival pallor.  PERRLA.  No  oropharyngeal erythema.  Poor dental care.  No thrush.  NECK:  Supple.  No lymphadenopathy.  LUNGS:  Good air movement.  No crackles or wheezing.  CVS:  RRR, no carotid bruits.  S1, S2.  ABDOMEN:  Mild epigastric tenderness.  Bowel sounds present,  nondistended.  EXTREMITIES:  No edema.  No cyanosis.  GU:  No CVA tenderness.  SKIN:  No rashes, no boils or tattoos.  LYMPH:  No palpable lymphadenopathy.  MSK:  No joint effusions or tenderness.  No stiffness.  NEUROLOGICALLY:  Alert, oriented x3, nonfocal.  Cranial nerves II-XII  intact.  Overall nonfocal.  PSYCH:  Anxious.  No SI or HI.   LABS:  Sodium 129, potassium 3.7, chloride 99, bicarb 21, BUN 6,  creatinine 0.68, glucose 119, calcium 8.8.  WBC 1.8, hemoglobin 14.5, platelets clumped, MCV 89, ANC 100.   HOSPITAL COURSE BY PROBLEM:  1. Neutropenic fever in the setting of sore throat, diarrhea, vomiting      in a patient with recurrent  neutropenic fevers and recent admission      in May 2010.  During this hospitalization, unclear etiology,      possibly viral in origin.  The patient was started on Fortaz and      was placed on neutropenic precautions.  Has clinically improved by      hospital day #2 and 3.  Eventually switched to p.o. antibiotic      Avelox.  She will be discharged on the same antibiotic following      leaving the hospital.  The patient has been receiving Neupogen      injections daily and is scheduled to receive another Neupogen      injection on April 07, 2009 when she is going to follow up with Dr.      Shirline Frees.  Recommendation per oncology is to get a second opinion      to further evaluate the patient's neutropenic fevers, recurrent      fevers.  UDS during the admission negative.  UA negative.  Blood      cultures negative, GC and chlamydia negative.  Wet prep negative.   1. Vomiting and diarrhea, possibly viral in etiology.  However,      remains unclear.  Stool studies negative.  The patient was hydrated      well.  Electrolytes were repeated and symptoms resolved on      discharge.   1. Polysubstance abuse.  UDS negative for cocaine.  EtOH levels within      normal limits.  The patient was started on folic acid and thiamine      and will continue the same vitamins upon discharge.   1. Thrombocytopenia, chronic, stable.  Platelets range between 70s to      90s.  No signs of bleeding on physical exam.  The patient was      advised if she does notice any signs of bleeding on skin she should      come back for further evaluation.   VITAL SIGNS ON DISCHARGE:  Temperature 98.7, blood pressure 136/97,  pulse 96, respirations 18, saturating 98% on room air.   LABS:  Sodium 135, potassium 3.4, chloride 105, bicarb 24, BUN 3,  creatinine 0.54, Glucose 110.  WBC 4.7, ANC 1800, hemoglobin 14.2, platelets 69.   Over 30 minutes was the spent on discharging the patient.      Mliss Sax, MD   Electronically Signed      Mick Sell, MD  Electronically Signed    IM/MEDQ  D:  04/06/2009  T:  04/06/2009  Job:  [id number]   cc:   Lajuana Matte, MD

## 2011-02-28 NOTE — Group Therapy Note (Signed)
NAME:  Katrina Kelley, ROWLEY NO.:  [id number]   MEDICAL RECORD NO.:  [id number]          PATIENT TYPE:  INP   LOCATION:  5011                         FACILITY:  [facility]   PHYSICIAN:  Charlestine Massed, MDDATE OF BIRTH:  November 19, 1955                                 PROGRESS NOTE   PRIMARY CARE PHYSICIAN:  Unassigned.   HOSPITAL COURSE SO FAR:  Ms. Katrina Kelley is a 55 year old female who has had prior  admission for neutropenia.  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  A bone marrow biopsy done during  the last admission showed plasma cell infiltrates suggestive of possible  plasma cell myeloma.   The patient was admitted this time with fever with a very low WBC count  of 2.1 with the absolute count being 200.  The patient was admitted with  neutropenic fever as the working diagnosis.  She was started on  antibiotics, IV cefepime then IV metronidazole.  The patient was found  to have thrush on her tongue and so nystatin swish and swallow was  started also.  An ID consult was called and she was also started on  fluconazole.  The patient has been continued on these antibiotics for  the past few days.   The patient has been initially labeled as allergic to penicillin but she  is tolerating cefepime very well, there are no allergic reactions seen  to cefepime.   So far the blood cultures have not shown any growth.  Urinalysis is  negative and urine culture is very insignificant and the genital  cultures for gonococcus and Chlamydia have been negative and blood  cultures done have been negative so far.  Two sets of blood cultures  done on March 11th have been negative so far.  Chest x-ray has not  revealed any infiltrates.  Urinalysis is negative and urine culture was  also negative.  An ID consult was done.  The patient is otherwise alert  and awake and not in any distress.  She initially had pain in the  abdomen.  A CAT  scan of abdomen with IV contrast was done which showed a  totally normal study, there was no evidence of any pelvic abscesses or  inflammatory sources in the pelvis or abdomen.   Heme/Onc follow-up was done.  Consult and follow-up was done.  Seen by  Dr. Arbutus Ped who has been following her as outpatient also.  As per Dr.  Arbutus Ped, the plasmacytosis is possibly a reactive phenomenon to her  hepatitis.  He does not feel that the patient has plasma cell myeloma at  this point and he stated there is no further plan to start any treatment  for that at this point.   A gastroenterology consultation was sought, seen by [facility] and they said that her hepatitis C is not treated here,  it should be treated either at Dr. Fredia Beets clinic or at [facility], for hepatitis C.  Currently, in view of the extremely  low neutrophil count, she also does not qualify for any hepatitis C  treatment  at this time.  She also has a prior history of polysubstance  abuse and needs to be evaluated for depression before we suggest  treatment for hepatitis C with  interferons.   SUBJECTIVE:  The patient was seen today.  She is very well alert and awake.  Her pain  in the mouth has considerably come down after nystatin.  The ulcers  still stay and there is some chronic pain still staying.  The abdominal  pain is resolved.  She still has fevers, she spiked to 101.2 in the  night, still has occasional cough with whitish sputum, no change in  color, no blood in the sputum.  A repeat chest x-ray done yesterday  shows normal study, no evidence of any infiltrates.   PHYSICAL EXAMINATION:  VITAL SIGNS:  T-max is 101.2, temperature now is 97.6, heart rate 80,  respiration 18, blood pressure 97/65.  The patient at baseline low blood  pressure.  GENERAL:  The patient is awake and alert, not in distress.  Afebrile.  HEAD/NECK:  Pupils reactive to light bilaterally.  No bleeding seen in   oral or nasal mucosa.  Faint tinge of oral pustules present.  Oral  ulcers present on the tongue and the lips present and painful.  No  bleeding seen.  Neck is supple, no JVD, no bruit, no nodes felt.  CHEST:  Bilateral air entry good anteriorly, posteriorly.  No rales, no  wheeze.  CARDIAC:  S1-S2 regular.  No murmur.  ABDOMEN:  Soft, nontender, no organomegaly.  No costovertebral angle  tenderness.  No tenderness in the lower hypogastric area.  Bowel sounds  positive.  EXTREMITIES:  No pedal edema.  No tenderness or erythema in the calf.  CNS:  No motor or sensory deficits.  Alert and oriented x3.  Comprehension and speech are intact.  No neck rigidity.  PSYCH:  She has a flat affect, slightly apprehensive, but otherwise  speaks and talks clearly and speech content is good.   LABS:  Today's CBC is WBC 1.8 with 3% neutrophils, 79% lymphocytes and 16%  monocytes.  Absolute neutrophil count is 100.  Hemoglobin 12.2,  hematocrit 34.9, platelets 311.   Yesterday's BMP was sodium 132, potassium 4.0, chloride 100, bicarbonate  23.  BUN 3, creatinine 0.58, calcium 8.1.  The albumin as measured  earlier, 2 days ago, was 1.9.   TSH done on March 12th is 0.566.   Her immunoglobulin study done on March 12th:  Total protein 7, IgA is  505 (high), IgG is 2600 (is high), IgM is 275 (is high).   Homocystine is 7.7.   HIV testing is nonreactive.   RPR is nonreactive.   Herpes simplex virus 1 IgG is 37.4.   Herpes simplex virus 2 IgG is 5.9.   Blood cultures, 2 sets, done on March 11th negative.  Urine culture done  on March 11th negative, and herpes simplex culture done on March 11th is  negative-from the oral and vaginal secretions ; Urine and blood culture  negative.   ASSESSMENT/PLAN:  1. Neutropenic fever.  2. 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  3. Hepatitis C infection.  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 Polysubstance abuse history.  6. Bacterial vaginosis, clue cells positive in the initial urinalysis.  7. Homeless.  Placement workup is being done by Child psychotherapist.   Plan:  1. Apparently,  patient to be continued on cefepime, Diflucan,      metronidazole and vancomycin was started on March 12th, is day 5 of      antibiotics.  I spoke to Dr. Orvan Falconer who says that he will see      today and then decide about antibiotics.  She still has fever but      there are no infectious sources found anywhere, so possibly the      fever could be attributed to either the plasmacytosis or to      hepatitis C.  Will follow ID decision for antibiotic.  2. Oral thrush.  Nystatin swish and swallow and continue IV Diflucan.  3. Fever.  Tylenol p.r.n.  4. For neutropenia is for Neupogen 480 mcg daily, started on March      12th.  Today is day 5 of Neupogen.  5. DVT prophylaxis with enoxaparin.  6. GI prophylaxis with Protonix.   DISPOSITION:  The patient still has neutropenia and so patient still needs to be on  reverse isolation precautions and will follow ID advice with regards to  further management.      Charlestine Massed, MD  Electronically Signed     UT/MEDQ  D:  12/29/2008  T:  12/29/2008  Job:  [id number]
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
## 2011-06-28 ENCOUNTER — Emergency Department (HOSPITAL_COMMUNITY): Payer: PRIVATE HEALTH INSURANCE

## 2011-06-28 ENCOUNTER — Emergency Department (HOSPITAL_COMMUNITY)
Admission: EM | Admit: 2011-06-28 | Discharge: 2011-06-28 | Disposition: A | Discharge status: Home / Self Care | Payer: PRIVATE HEALTH INSURANCE | Attending: Emergency Medicine | Admitting: Emergency Medicine

## 2011-06-28 DIAGNOSIS — R071 Chest pain on breathing: Secondary | ICD-10-CM | POA: Insufficient documentation

## 2011-06-28 DIAGNOSIS — Z8659 Personal history of other mental and behavioral disorders: Secondary | ICD-10-CM | POA: Insufficient documentation

## 2011-06-28 DIAGNOSIS — F319 Bipolar disorder, unspecified: Secondary | ICD-10-CM | POA: Insufficient documentation

## 2011-06-28 DIAGNOSIS — M940 Chondrocostal junction syndrome [Tietze]: Secondary | ICD-10-CM | POA: Insufficient documentation

## 2011-06-28 DIAGNOSIS — S20219A Contusion of unspecified front wall of thorax, initial encounter: Secondary | ICD-10-CM | POA: Insufficient documentation

## 2011-06-28 DIAGNOSIS — S0510XA Contusion of eyeball and orbital tissues, unspecified eye, initial encounter: Secondary | ICD-10-CM | POA: Insufficient documentation

## 2011-06-28 DIAGNOSIS — R51 Headache: Secondary | ICD-10-CM | POA: Insufficient documentation

## 2011-07-10 LAB — I-STAT 8, (EC8 V) (CONVERTED LAB)
Acid-base deficit: 2
HCT: 45
Operator id: 295021
Potassium: 4.2
TCO2: 23
pH, Ven: 7.429 — ABNORMAL HIGH

## 2011-07-10 LAB — CBC
HCT: 38.4
HCT: 41.9
Hemoglobin: 13.6
Hemoglobin: 14.5
MCHC: 34.6
MCV: 90.6
RBC: 4.24
RDW: 14.2
WBC: 2.2 — ABNORMAL LOW

## 2011-07-10 LAB — URINE MICROSCOPIC-ADD ON

## 2011-07-10 LAB — URINALYSIS, ROUTINE W REFLEX MICROSCOPIC
Bilirubin Urine: NEGATIVE
Glucose, UA: NEGATIVE
Glucose, UA: NEGATIVE
Hgb urine dipstick: NEGATIVE
Ketones, ur: NEGATIVE
Ketones, ur: NEGATIVE
Nitrite: NEGATIVE
Nitrite: NEGATIVE
Specific Gravity, Urine: 1.02
Specific Gravity, Urine: 1.013
pH: 5.5
pH: 5.5

## 2011-07-10 LAB — DIFFERENTIAL
Basophils Absolute: 0
Basophils Relative: 1
Basophils Relative: 2 — ABNORMAL HIGH
Eosinophils Absolute: 0.1
Eosinophils Relative: 0
Eosinophils Relative: 2
Lymphs Abs: 1.3
Monocytes Absolute: 0.3
Monocytes Absolute: 0.4
Monocytes Relative: 15 — ABNORMAL HIGH
Neutro Abs: 0.6 — ABNORMAL LOW

## 2011-07-10 LAB — BASIC METABOLIC PANEL
BUN: 15
CO2: 23
Calcium: 9
Glucose, Bld: 112 — ABNORMAL HIGH
Potassium: 4
Sodium: 139

## 2011-07-10 LAB — WET PREP, GENITAL: Yeast Wet Prep HPF POC: NONE SEEN

## 2011-07-10 LAB — RPR: RPR Ser Ql: NONREACTIVE

## 2011-07-18 LAB — DIFFERENTIAL
Basophils Absolute: 0
Basophils Relative: 1
Eosinophils Absolute: 0.1
Eosinophils Relative: 3
Lymphocytes Relative: 51 — ABNORMAL HIGH
Lymphs Abs: 2.1
Monocytes Absolute: 0.4
Monocytes Relative: 10
Neutro Abs: 1.4 — ABNORMAL LOW
Neutrophils Relative %: 36 — ABNORMAL LOW

## 2011-07-18 LAB — CULTURE, BLOOD (ROUTINE X 2)
Culture: NO GROWTH
Culture: NO GROWTH

## 2011-07-18 LAB — CBC
HCT: 39.1
MCV: 92.1
Platelets: 153
Platelets: 155
RDW: 12.4
RDW: 12.6
WBC: 5.9

## 2011-07-18 LAB — URINE CULTURE: Colony Count: 40000

## 2011-07-18 LAB — POCT I-STAT, CHEM 8
BUN: 16
Creatinine, Ser: 0.8
Sodium: 142
TCO2: 24

## 2011-07-18 LAB — COMPREHENSIVE METABOLIC PANEL
ALT: 26
Albumin: 3.4 — ABNORMAL LOW
Alkaline Phosphatase: 67
BUN: 15
GFR calc Af Amer: >60
Potassium: 4
Sodium: 138
Total Protein: 7.7

## 2011-07-18 LAB — URINALYSIS, ROUTINE W REFLEX MICROSCOPIC
Bilirubin Urine: NEGATIVE
Glucose, UA: NEGATIVE
Hgb urine dipstick: NEGATIVE
Specific Gravity, Urine: 1.027
pH: 6

## 2011-07-18 LAB — BASIC METABOLIC PANEL
BUN: 13
Chloride: 108
Creatinine, Ser: 0.55
GFR calc non Af Amer: >60
Glucose, Bld: 127 — ABNORMAL HIGH

## 2011-07-18 LAB — TSH: TSH: 0.261 — ABNORMAL LOW

## 2011-07-18 LAB — PROTIME-INR
INR: 1
Prothrombin Time: 13.1

## 2011-07-18 LAB — POCT CARDIAC MARKERS
CKMB, poc: 2.9
CKMB, poc: 3.4
Troponin i, poc: <0.05
Troponin i, poc: <0.05

## 2011-07-18 LAB — LEGIONELLA ANTIGEN, URINE: Legionella Antigen, Urine: NEGATIVE

## 2011-07-18 LAB — VALPROIC ACID LEVEL: Valproic Acid Lvl: <10 — ABNORMAL LOW

## 2011-08-24 ENCOUNTER — Emergency Department (HOSPITAL_COMMUNITY)
Admission: EM | Admit: 2011-08-24 | Discharge: 2011-08-24 | Disposition: A | Discharge status: Home / Self Care | Payer: PRIVATE HEALTH INSURANCE | Attending: Emergency Medicine | Admitting: Emergency Medicine

## 2011-08-24 ENCOUNTER — Encounter: Payer: Self-pay | Admitting: *Deleted

## 2011-08-24 DIAGNOSIS — J069 Acute upper respiratory infection, unspecified: Secondary | ICD-10-CM | POA: Insufficient documentation

## 2011-08-24 DIAGNOSIS — F17200 Nicotine dependence, unspecified, uncomplicated: Secondary | ICD-10-CM | POA: Insufficient documentation

## 2011-08-24 DIAGNOSIS — J40 Bronchitis, not specified as acute or chronic: Secondary | ICD-10-CM | POA: Insufficient documentation

## 2011-08-24 DIAGNOSIS — J3489 Other specified disorders of nose and nasal sinuses: Secondary | ICD-10-CM | POA: Insufficient documentation

## 2011-08-24 DIAGNOSIS — R059 Cough, unspecified: Secondary | ICD-10-CM | POA: Insufficient documentation

## 2011-08-24 DIAGNOSIS — R05 Cough: Secondary | ICD-10-CM | POA: Insufficient documentation

## 2011-08-24 DIAGNOSIS — R6883 Chills (without fever): Secondary | ICD-10-CM | POA: Insufficient documentation

## 2011-08-24 MED ORDER — BENZONATATE 100 MG PO CAPS: 100.0000 mg | ORAL_CAPSULE | Freq: Three times a day (TID) | ORAL | Status: AC

## 2011-08-24 MED ORDER — AZITHROMYCIN 250 MG PO TABS: ORAL_TABLET | ORAL | Status: AC

## 2011-08-24 NOTE — ED Notes (Signed)
Patient reports cough with production,  Yellow in color.  She also reports nasal congestion.  Patient denies pain at present.  She relates her pain to coughing.  Concern for exposure,  She was in a 28 day program.  She reports she has had night sweats, unsure of fever
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
## 2011-08-24 NOTE — ED Notes (Signed)
Patient reports she has had cold sx, cough with phlegm, sinus pressure, and fever at night for 1 month.

## 2011-11-22 ENCOUNTER — Encounter (HOSPITAL_BASED_OUTPATIENT_CLINIC_OR_DEPARTMENT_OTHER): Payer: Self-pay | Admitting: *Deleted

## 2011-11-22 NOTE — Progress Notes (Signed)
Pt denies any meds,drugs,alcohol.

## 2011-11-23 NOTE — H&P (Signed)
HISTORY AND PHYSICAL  Katrina Kelley is a 56 y.o. female patient with CC: Dental pain  No diagnosis found.  Past Medical History  Diagnosis Date  . Bipolar 1 disorder   . Depression     No current facility-administered medications for this encounter.   Current Outpatient Prescriptions  Medication Sig Dispense Refill  . acetaminophen (TYLENOL) 325 MG tablet Take 650 mg by mouth every 6 (six) hours as needed.      . Multiple Vitamin (MULTIVITAMIN) capsule Take 1 capsule by mouth daily.       Allergies  Allergen Reactions  . Demerol Swelling  . Penicillins    Active Problems:  * No active hospital problems. *   Vitals: Height 5\' 9"  (1.753 m), weight 106.595 kg (235 lb). Lab results:No results found for this or any previous visit (from the past 24 hour(s)). Radiology Results: No results found. General appearance: alert, cooperative and no distress Head: Normocephalic, without obvious abnormality, atraumatic Eyes: conjunctivae/corneas clear. PERRL, EOM's intact. Fundi benign. Ears: normal TM's and external ear canals both ears Nose: Nares normal. Septum midline. Mucosa normal. No drainage or sinus tenderness. Throat: severe localized periodontis, dental caries multiple teeth Neck: no adenopathy, no carotid bruit, no JVD, supple, symmetrical, trachea midline and thyroid not enlarged, symmetric, no tenderness/mass/nodules Resp: clear to auscultation bilaterally Cardio: regular rate and rhythm, S1, S2 normal, no murmur, click, rub or gallop GI: soft, non-tender; bowel sounds normal; no masses,  no organomegaly Extremities: extremities normal, atraumatic, no cyanosis or edema Neurologic: Grossly normal  Assessment: 55 BF Hep C, Bipolar, with non-restorable teeth #'s 2, 3, 8, 9, 14, 15, 19, 23, 24, 25, 26, 29 secondary to dental caries and periodontal disease.  Plan: Removal teeth #'s 2, 3, 8, 9, 14, 15, 19, 23, 24, 25, 26, 29 with GA, day surgery.    Georgia Lopes 11/23/2011

## 2011-11-27 ENCOUNTER — Encounter (HOSPITAL_BASED_OUTPATIENT_CLINIC_OR_DEPARTMENT_OTHER)
Admission: RE | Disposition: A | Discharge status: Home / Self Care | Payer: Self-pay | Source: Ambulatory Visit | Attending: Oral Surgery

## 2011-11-27 ENCOUNTER — Encounter (HOSPITAL_BASED_OUTPATIENT_CLINIC_OR_DEPARTMENT_OTHER): Payer: Self-pay | Admitting: Certified Registered"

## 2011-11-27 ENCOUNTER — Ambulatory Visit (HOSPITAL_BASED_OUTPATIENT_CLINIC_OR_DEPARTMENT_OTHER): Payer: PRIVATE HEALTH INSURANCE | Admitting: Certified Registered"

## 2011-11-27 ENCOUNTER — Ambulatory Visit (HOSPITAL_BASED_OUTPATIENT_CLINIC_OR_DEPARTMENT_OTHER)
Admission: RE | Admit: 2011-11-27 | Discharge: 2011-11-27 | Disposition: A | Discharge status: Home / Self Care | Payer: PRIVATE HEALTH INSURANCE | Source: Ambulatory Visit | Attending: Oral Surgery | Admitting: Oral Surgery

## 2011-11-27 ENCOUNTER — Encounter (HOSPITAL_BASED_OUTPATIENT_CLINIC_OR_DEPARTMENT_OTHER): Payer: Self-pay | Admitting: *Deleted

## 2011-11-27 DIAGNOSIS — F1010 Alcohol abuse, uncomplicated: Secondary | ICD-10-CM | POA: Insufficient documentation

## 2011-11-27 DIAGNOSIS — B1920 Unspecified viral hepatitis C without hepatic coma: Secondary | ICD-10-CM | POA: Insufficient documentation

## 2011-11-27 DIAGNOSIS — K05319 Chronic periodontitis, localized, unspecified severity: Secondary | ICD-10-CM

## 2011-11-27 DIAGNOSIS — K056 Periodontal disease, unspecified: Secondary | ICD-10-CM | POA: Insufficient documentation

## 2011-11-27 DIAGNOSIS — F17200 Nicotine dependence, unspecified, uncomplicated: Secondary | ICD-10-CM | POA: Insufficient documentation

## 2011-11-27 DIAGNOSIS — K069 Disorder of gingiva and edentulous alveolar ridge, unspecified: Secondary | ICD-10-CM | POA: Insufficient documentation

## 2011-11-27 DIAGNOSIS — K029 Dental caries, unspecified: Secondary | ICD-10-CM | POA: Insufficient documentation

## 2011-11-27 DIAGNOSIS — F259 Schizoaffective disorder, unspecified: Secondary | ICD-10-CM | POA: Insufficient documentation

## 2011-11-27 DIAGNOSIS — F3130 Bipolar disorder, current episode depressed, mild or moderate severity, unspecified: Secondary | ICD-10-CM | POA: Insufficient documentation

## 2011-11-27 HISTORY — DX: Bipolar disorder, unspecified: F31.9

## 2011-11-27 HISTORY — PX: TOOTH EXTRACTION: SHX859

## 2011-11-27 HISTORY — DX: Depression, unspecified: F32.A

## 2011-11-27 HISTORY — DX: Major depressive disorder, single episode, unspecified: F32.9

## 2011-11-27 SURGERY — DENTAL RESTORATION/EXTRACTIONS
Anesthesia: General | Site: Mouth | Wound class: Clean Contaminated

## 2011-11-27 MED ORDER — OXYCODONE-ACETAMINOPHEN 5-325 MG PO TABS
2.0000 | ORAL_TABLET | Freq: Once | ORAL | Status: AC
  Administered 2011-11-27: 2 via ORAL

## 2011-11-27 MED ORDER — HYDROMORPHONE HCL PF 1 MG/ML IJ SOLN: 0.2500 mg | INTRAMUSCULAR | Status: DC | PRN

## 2011-11-27 MED ORDER — LIDOCAINE-EPINEPHRINE 2 %-1:100000 IJ SOLN
INTRAMUSCULAR | Status: DC | PRN
  Administered 2011-11-27: 18 mL

## 2011-11-27 MED ORDER — PROPOFOL 10 MG/ML IV EMUL
INTRAVENOUS | Status: DC | PRN
  Administered 2011-11-27: 150 mg via INTRAVENOUS
  Administered 2011-11-27: 50 mg via INTRAVENOUS

## 2011-11-27 MED ORDER — SUCCINYLCHOLINE CHLORIDE 20 MG/ML IJ SOLN
INTRAMUSCULAR | Status: DC | PRN
  Administered 2011-11-27: 100 mg via INTRAVENOUS

## 2011-11-27 MED ORDER — OXYCODONE-ACETAMINOPHEN 5-325 MG PO TABS: 1.0000 | ORAL_TABLET | ORAL | Status: AC | PRN

## 2011-11-27 MED ORDER — FENTANYL CITRATE 0.05 MG/ML IJ SOLN
INTRAMUSCULAR | Status: DC | PRN
  Administered 2011-11-27: 100 ug via INTRAVENOUS

## 2011-11-27 MED ORDER — LACTATED RINGERS IV SOLN
INTRAVENOUS | Status: DC
  Administered 2011-11-27: 08:00:00 via INTRAVENOUS

## 2011-11-27 MED ORDER — CEFAZOLIN SODIUM 1-5 GM-% IV SOLN
INTRAVENOUS | Status: DC | PRN
  Administered 2011-11-27: 2 g via INTRAVENOUS

## 2011-11-27 MED ORDER — DEXAMETHASONE SODIUM PHOSPHATE 4 MG/ML IJ SOLN
INTRAMUSCULAR | Status: DC | PRN
  Administered 2011-11-27: 10 mg via INTRAVENOUS

## 2011-11-27 MED ORDER — ONDANSETRON HCL 4 MG/2ML IJ SOLN: 4.0000 mg | Freq: Four times a day (QID) | INTRAMUSCULAR | Status: DC | PRN

## 2011-11-27 MED ORDER — LIDOCAINE HCL (CARDIAC) 20 MG/ML IV SOLN
INTRAVENOUS | Status: DC | PRN
  Administered 2011-11-27: 60 mg via INTRAVENOUS

## 2011-11-27 MED ORDER — EPHEDRINE SULFATE 50 MG/ML IJ SOLN
INTRAMUSCULAR | Status: DC | PRN
  Administered 2011-11-27: 10 mg via INTRAVENOUS

## 2011-11-27 MED ORDER — MIDAZOLAM HCL 5 MG/5ML IJ SOLN
INTRAMUSCULAR | Status: DC | PRN
  Administered 2011-11-27: 2 mg via INTRAVENOUS

## 2011-11-27 SURGICAL SUPPLY — 43 items
5820-15-40 ×1 IMPLANT
BLADE SURG 15 STRL LF DISP TIS (BLADE) ×1 IMPLANT
BLADE SURG 15 STRL SS (BLADE) ×2
BRR OPER DNTL INFCT CNTRL SYR (MISCELLANEOUS)
BUR CROSS CUT FISSURE 1.6 (BURR) ×3 IMPLANT
BUR EGG 3PK/BX (BURR) IMPLANT
BUR FAST CUTTING MED (BURR) IMPLANT
BUR FISSURE CARBIDE (BURR) IMPLANT
BUR RND POLISHING (BURR) IMPLANT
BUR SIDE CUT (BURR) IMPLANT
CANISTER SUCTION 1200CC (MISCELLANEOUS) ×2 IMPLANT
CATH ROBINSON RED A/P 10FR (CATHETERS) IMPLANT
COVER MAYO STAND STRL (DRAPES) ×2 IMPLANT
COVER SLEEVE SYR LF (MISCELLANEOUS) ×1 IMPLANT
COVER TABLE BACK 60X90 (DRAPES) ×2 IMPLANT
DECANTER SPIKE VIAL GLASS SM (MISCELLANEOUS) IMPLANT
DRAPE U-SHAPE 76X120 STRL (DRAPES) ×2 IMPLANT
GAUZE PACKING FOLDED 2  STR (GAUZE/BANDAGES/DRESSINGS) ×1
GAUZE PACKING FOLDED 2 STR (GAUZE/BANDAGES/DRESSINGS) ×1 IMPLANT
GAUZE PACKING IODOFORM 1/4X5 (PACKING) IMPLANT
GLOVE BIO SURGEON STRL SZ 6.5 (GLOVE) ×3 IMPLANT
GLOVE BIO SURGEON STRL SZ7.5 (GLOVE) ×2 IMPLANT
GOWN PREVENTION PLUS XLARGE (GOWN DISPOSABLE) ×4 IMPLANT
IV NS 500ML (IV SOLUTION) ×2
IV NS 500ML BAXH (IV SOLUTION) ×1 IMPLANT
NEEDLE HYPO 22GX1.5 SAFETY (NEEDLE) ×2 IMPLANT
NS IRRIG 1000ML POUR BTL (IV SOLUTION) ×2 IMPLANT
PACK BASIN DAY SURGERY FS (CUSTOM PROCEDURE TRAY) ×2 IMPLANT
SPONGE SURGIFOAM ABS GEL 12-7 (HEMOSTASIS) IMPLANT
STRIP CLOSURE SKIN 1/2X4 (GAUZE/BANDAGES/DRESSINGS) IMPLANT
SUT CHROMIC 3 0 PS 2 (SUTURE) ×4 IMPLANT
SYR 20CC LL (SYRINGE) IMPLANT
SYR BULB 3OZ (MISCELLANEOUS) IMPLANT
SYR CONTROL 10ML LL (SYRINGE) ×2 IMPLANT
TOOTHBRUSH ADULT (PERSONAL CARE ITEMS) IMPLANT
TOWEL OR 17X24 6PK STRL BLUE (TOWEL DISPOSABLE) ×2 IMPLANT
TOWEL OR NON WOVEN STRL DISP B (DISPOSABLE) ×2 IMPLANT
TRAY DSU PREP LF (CUSTOM PROCEDURE TRAY) IMPLANT
TUBE CONNECTING 20X1/4 (TUBING) ×2 IMPLANT
TUBING IRRIGATION (MISCELLANEOUS) ×1 IMPLANT
WATER STERILE IRR 1000ML POUR (IV SOLUTION) ×2 IMPLANT
WATER TABLETS ICX (MISCELLANEOUS) ×1 IMPLANT
YANKAUER SUCT BULB TIP NO VENT (SUCTIONS) ×2 IMPLANT

## 2011-11-27 NOTE — Op Note (Signed)
11/27/2011  8:19 AM  PATIENT:  Katrina Kelley  56 y.o. female  PRE-OPERATIVE DIAGNOSIS:  non restorable teeth #'s 2, 3, 8, 9, 14, 15, 19, 23, 24, 25, 26, 29  POST-OPERATIVE DIAGNOSIS:  SAME  PROCEDURE:  Procedure(s): DENTAL RESTORATION/EXTRACTION non restorable teeth #'s 2, 3, 8, 9, 14, 15, 19, 23, 24, 25, 26, 29   SURGEON:  Surgeon(s): Georgia Lopes, DDS  ANESTHESIA:   local and general  EBL:  minimal  DRAINS: none   LOCAL MEDICATIONS USED:  LIDOCAINE 18CC  SPECIMEN:  No Specimen  COUNTS:  YES  PLAN OF CARE: Discharge to home after PACU  PATIENT DISPOSITION:  PACU - hemodynamically stable.   PROCEDURE DETAILS: Dictation # [id number]  Georgia Lopes, DMD 11/27/2011 8:19 AM

## 2011-11-27 NOTE — Anesthesia Postprocedure Evaluation (Signed)
Anesthesia Post Note  Patient: Katrina Kelley  Procedure(s) Performed:  DENTAL RESTORATION/EXTRACTIONS - just extractions  Anesthesia type: General  Patient location: PACU  Post pain: Pain level controlled and Adequate analgesia  Post assessment: Post-op Vital signs reviewed, Patient's Cardiovascular Status Stable, Respiratory Function Stable, Patent Airway and Pain level controlled  Last Vitals:  Filed Vitals:   11/27/11 0635  BP: 97/64  Pulse: 81  Temp: 36.9 C  Resp: 18    Post vital signs: Reviewed and stable  Level of consciousness: awake, alert  and oriented  Complications: No apparent anesthesia complications

## 2011-11-27 NOTE — Progress Notes (Signed)
H&P documentation  -History and Physical Reviewed  -Patient has been re-examined  -No change in the plan of care  Katrina Kelley  

## 2011-11-27 NOTE — Anesthesia Preprocedure Evaluation (Signed)
Anesthesia Evaluation  Patient identified by MRN, date of birth, ID band Patient awake    Reviewed: Allergy & Precautions, H&P , NPO status , Patient's Chart, lab work & pertinent test results  Airway Mallampati: II  Neck ROM: full    Dental   Pulmonary Current Smoker,          Cardiovascular     Neuro/Psych PSYCHIATRIC DISORDERS Bipolar Disorder    GI/Hepatic   Endo/Other    Renal/GU      Musculoskeletal   Abdominal   Peds  Hematology   Anesthesia Other Findings   Reproductive/Obstetrics                           Anesthesia Physical Anesthesia Plan  ASA: II  Anesthesia Plan: General   Post-op Pain Management:    Induction: Intravenous  Airway Management Planned:   Additional Equipment:   Intra-op Plan:   Post-operative Plan: Extubation in OR  Informed Consent: I have reviewed the patients History and Physical, chart, labs and discussed the procedure including the risks, benefits and alternatives for the proposed anesthesia with the patient or authorized representative who has indicated his/her understanding and acceptance.     Plan Discussed with: CRNA and Surgeon  Anesthesia Plan Comments:         Anesthesia Quick Evaluation

## 2011-11-27 NOTE — Anesthesia Procedure Notes (Signed)
Procedure Name: Intubation Date/Time: 11/27/2011 7:53 AM Performed by: Radford Pax Pre-anesthesia Checklist: Patient identified, Emergency Drugs available, Suction available, Patient being monitored and Timeout performed Patient Re-evaluated:Patient Re-evaluated prior to inductionOxygen Delivery Method: Circle System Utilized Preoxygenation: Pre-oxygenation with 100% oxygen Intubation Type: IV induction Ventilation: Mask ventilation without difficulty Laryngoscope Size: Miller and 3 Grade View: Grade II Nasal Tubes: Nasal prep performed and Nasal Rae Tube size: 7.0 mm Number of attempts: 1 Placement Confirmation: ETT inserted through vocal cords under direct vision,  positive ETCO2 and breath sounds checked- equal and bilateral Tube secured with: Tape Dental Injury: Teeth and Oropharynx as per pre-operative assessment  Comments: Afrin sprayed both nares.

## 2011-11-27 NOTE — Transfer of Care (Signed)
Immediate Anesthesia Transfer of Care Note  Patient: Katrina Kelley  Procedure(s) Performed:  DENTAL RESTORATION/EXTRACTIONS - just extractions  Patient Location: PACU  Anesthesia Type: General  Level of Consciousness: awake, alert , oriented and patient cooperative  Airway & Oxygen Therapy: Patient Spontanous Breathing and Patient connected to face mask oxygen  Post-op Assessment: Report given to PACU RN and Post -op Vital signs reviewed and stable  Post vital signs: Reviewed and stable  Complications: No apparent anesthesia complications

## 2011-11-28 ENCOUNTER — Encounter (HOSPITAL_BASED_OUTPATIENT_CLINIC_OR_DEPARTMENT_OTHER): Payer: Self-pay | Admitting: Oral Surgery

## 2011-11-28 NOTE — Op Note (Signed)
NAME:  Katrina Kelley, Katrina Kelley                ACCOUNT NO.:  MEDICAL RECORD NO.:  [id number]  LOCATION:                                 FACILITY:  PHYSICIAN:  Georgia Lopes, M.D.  DATE OF BIRTH:  [birth year]  DATE OF PROCEDURE:  11/27/2011 DATE OF DISCHARGE:                              OPERATIVE REPORT   PREOPERATIVE DIAGNOSIS:  Nonrestorable teeth, numbers 2, 3, 8, 9, 14, 15, 19, 23, 24, 25, 26, 29.  POSTOPERATIVE DIAGNOSIS:  Nonrestorable teeth, numbers 2, 3, 8, 9, 14, 15, 19, 23, 24, 25, 26, 29.  PROCEDURE:  Removal of teeth numbers 2, 3, 8, 9, 14, 15, 19, 23, 24, 25, 26, and 29.  SURGEON:  Georgia Lopes, MD  ANESTHESIA:  General nasal intubation.  INDICATIONS FOR PROCEDURE:  Katrina Kelley is a 56 year old female who is referred to my office by her general dentist for removal of multiple nonrestorable teeth secondary to dental decay and periodontal disease. Her past medical history is significant for bipolar, depression, hep C, drug abuse, alcohol abuse, schizoaffective disorder.  Because of the number of teeth to be removed and the patient's medical history, it was recommended that general anesthesia be used with intubation for airway protection.  PROCEDURE:  The patient was taken to the operating room, placed on the table in supine position.  General anesthesia was administered intravenously, and a nasal endotracheal tube was placed and secured. The eyes were protected.  The patient was draped for the procedure.  The posterior pharynx was suctioned with the Yankauer suction, and a throat pack was placed.  Then, 2% lidocaine 1:100,000 epinephrine was infiltrated in an inferior alveolar block on the right and left side and in buccal and palatal infiltration around the maxillary teeth to be removed.  The anterior mandible on buccal aspect was also infiltrated. A total of 18 mL was utilized.  A 15 blade was used after a bite block was placed in the mouth and a sweetheart  retractor was placed to retract the tongue.  The blade was used around teeth numbers 14, 15, 19, 23, 24, 25, 26 in a circumferential fashion.  The periosteum was then elevated around these teeth and then bone was removed interproximally around teeth numbers 14, 15, and 19.  The teeth were then elevated with a 301 elevator.  The upper teeth were removed with a number 150 forceps, and the lower tooth #19 was removed with a number 151 forceps.  The anterior teeth numbers 23, 24, 25, 26 were removed with an Asch forceps.  Then, the sockets were curetted, irrigated, and closed with 3-0 chromic.  The bite block and sweetheart retractor were repositioned to the opposite side of the mouth.  A 15 blade was used to make a full-thickness incision around tooth numbers 29, 2, 3, and 8 and 9.  The periosteum was reflected from around these teeth, interproximal bone was removed around tooth number 29 and around teeth numbers 2 and 3.  Then, the teeth were elevated with a 301 elevator.  The upper teeth 2, 3, 8, and 9 were removed with the upper number 150 forceps.  Tooth number 29 was removed with the Asch forceps.  Sockets were then irrigated, curetted, and closed with 3-0 chromic.  The oral cavity was inspected and found to have good closure and hemostasis.  The throat pack was removed after suctioning, and the patient was awakened, taken to the recovery room breathing spontaneously in good condition.  ESTIMATED BLOOD LOSS:  Minimum.  COMPLICATIONS:  None.  SPECIMENS:  None.     Georgia Lopes, M.D.     SMJ/MEDQ  D:  11/27/2011  T:  11/27/2011  Job:  [id number]

## 2012-05-13 ENCOUNTER — Ambulatory Visit: Payer: PRIVATE HEALTH INSURANCE | Attending: Internal Medicine | Admitting: Physical Therapy

## 2012-05-13 ENCOUNTER — Encounter: Payer: Self-pay | Admitting: Physical Therapy

## 2012-05-13 DIAGNOSIS — M25579 Pain in unspecified ankle and joints of unspecified foot: Secondary | ICD-10-CM | POA: Insufficient documentation

## 2012-05-13 DIAGNOSIS — IMO0001 Reserved for inherently not codable concepts without codable children: Secondary | ICD-10-CM | POA: Insufficient documentation

## 2012-05-13 DIAGNOSIS — M25676 Stiffness of unspecified foot, not elsewhere classified: Secondary | ICD-10-CM | POA: Insufficient documentation

## 2012-05-13 DIAGNOSIS — R269 Unspecified abnormalities of gait and mobility: Secondary | ICD-10-CM | POA: Insufficient documentation

## 2012-05-13 DIAGNOSIS — M25673 Stiffness of unspecified ankle, not elsewhere classified: Secondary | ICD-10-CM | POA: Insufficient documentation

## 2012-05-15 ENCOUNTER — Ambulatory Visit: Payer: PRIVATE HEALTH INSURANCE | Admitting: Rehabilitation

## 2012-05-21 ENCOUNTER — Ambulatory Visit: Payer: PRIVATE HEALTH INSURANCE | Attending: Internal Medicine | Admitting: Physical Therapy

## 2012-05-21 DIAGNOSIS — R269 Unspecified abnormalities of gait and mobility: Secondary | ICD-10-CM | POA: Insufficient documentation

## 2012-05-21 DIAGNOSIS — M25673 Stiffness of unspecified ankle, not elsewhere classified: Secondary | ICD-10-CM | POA: Insufficient documentation

## 2012-05-21 DIAGNOSIS — M25676 Stiffness of unspecified foot, not elsewhere classified: Secondary | ICD-10-CM | POA: Insufficient documentation

## 2012-05-21 DIAGNOSIS — M25579 Pain in unspecified ankle and joints of unspecified foot: Secondary | ICD-10-CM | POA: Insufficient documentation

## 2012-05-21 DIAGNOSIS — IMO0001 Reserved for inherently not codable concepts without codable children: Secondary | ICD-10-CM | POA: Insufficient documentation

## 2012-05-24 ENCOUNTER — Ambulatory Visit: Payer: PRIVATE HEALTH INSURANCE | Admitting: Rehabilitation

## 2012-05-27 ENCOUNTER — Ambulatory Visit: Payer: PRIVATE HEALTH INSURANCE | Admitting: Rehabilitation

## 2012-05-29 ENCOUNTER — Encounter: Payer: Self-pay | Admitting: Rehabilitation

## 2012-08-19 ENCOUNTER — Encounter (HOSPITAL_COMMUNITY): Payer: Self-pay | Admitting: *Deleted

## 2012-08-19 ENCOUNTER — Inpatient Hospital Stay (HOSPITAL_COMMUNITY)
Admission: RE | Admit: 2012-08-19 | Discharge: 2012-08-22 | DRG: 885 | Disposition: A | Discharge status: Home / Self Care | Payer: 59 | Attending: Psychiatry | Admitting: Psychiatry

## 2012-08-19 DIAGNOSIS — IMO0002 Reserved for concepts with insufficient information to code with codable children: Secondary | ICD-10-CM

## 2012-08-19 DIAGNOSIS — F332 Major depressive disorder, recurrent severe without psychotic features: Principal | ICD-10-CM | POA: Diagnosis present

## 2012-08-19 DIAGNOSIS — F339 Major depressive disorder, recurrent, unspecified: Secondary | ICD-10-CM

## 2012-08-19 DIAGNOSIS — R45851 Suicidal ideations: Secondary | ICD-10-CM

## 2012-08-19 LAB — COMPREHENSIVE METABOLIC PANEL
ALT: 29 U/L (ref 0–35)
AST: 41 U/L — ABNORMAL HIGH (ref 0–37)
Calcium: 9.1 mg/dL (ref 8.4–10.5)
Creatinine, Ser: 0.75 mg/dL (ref 0.50–1.10)
Sodium: 136 mEq/L (ref 135–145)
Total Protein: 8.3 g/dL (ref 6.0–8.3)

## 2012-08-19 LAB — CBC
MCH: 30.6 pg (ref 26.0–34.0)
MCHC: 35.3 g/dL (ref 30.0–36.0)
Platelets: 195 10*3/uL (ref 150–400)
RDW: 13.2 % (ref 11.5–15.5)

## 2012-08-19 LAB — URINALYSIS, ROUTINE W REFLEX MICROSCOPIC
Glucose, UA: NEGATIVE mg/dL
Leukocytes, UA: NEGATIVE
Nitrite: NEGATIVE
Protein, ur: NEGATIVE mg/dL
pH: 6.5 (ref 5.0–8.0)

## 2012-08-19 LAB — RAPID URINE DRUG SCREEN, HOSP PERFORMED
Amphetamines: NOT DETECTED
Benzodiazepines: NOT DETECTED
Opiates: NOT DETECTED

## 2012-08-19 MED ORDER — ACETAMINOPHEN 325 MG PO TABS
650.0000 mg | ORAL_TABLET | Freq: Four times a day (QID) | ORAL | Status: DC | PRN
  Administered 2012-08-19 – 2012-08-20 (×2): 650 mg via ORAL

## 2012-08-19 MED ORDER — ALUM & MAG HYDROXIDE-SIMETH 200-200-20 MG/5ML PO SUSP: 30.0000 mL | ORAL | Status: DC | PRN

## 2012-08-19 MED ORDER — MAGNESIUM HYDROXIDE 400 MG/5ML PO SUSP: 30.0000 mL | Freq: Every day | ORAL | Status: DC | PRN
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
## 2012-08-20 DIAGNOSIS — F332 Major depressive disorder, recurrent severe without psychotic features: Principal | ICD-10-CM

## 2012-08-20 DIAGNOSIS — F339 Major depressive disorder, recurrent, unspecified: Secondary | ICD-10-CM

## 2012-08-20 LAB — TSH: TSH: 1.489 u[IU]/mL (ref 0.350–4.500)

## 2012-08-20 MED ORDER — TRAZODONE HCL 50 MG PO TABS
50.0000 mg | ORAL_TABLET | Freq: Every evening | ORAL | Status: DC | PRN
  Filled 2012-08-20: qty 14

## 2012-08-20 MED ORDER — ADULT MULTIVITAMIN W/MINERALS CH
1.0000 | ORAL_TABLET | Freq: Every day | ORAL | Status: DC
  Administered 2012-08-20 – 2012-08-22 (×3): 1 via ORAL
  Filled 2012-08-20 (×4): qty 1

## 2012-08-20 MED ORDER — OLANZAPINE 10 MG PO TABS
10.0000 mg | ORAL_TABLET | Freq: Every day | ORAL | Status: DC
  Administered 2012-08-20 – 2012-08-21 (×2): 10 mg via ORAL
  Filled 2012-08-20 (×3): qty 1

## 2012-08-20 MED ORDER — ACETAMINOPHEN 325 MG PO TABS: 650.0000 mg | ORAL_TABLET | Freq: Four times a day (QID) | ORAL | Status: DC | PRN

## 2012-08-20 MED ORDER — MELOXICAM 7.5 MG PO TABS
15.0000 mg | ORAL_TABLET | Freq: Every day | ORAL | Status: DC
  Administered 2012-08-20 – 2012-08-22 (×3): 15 mg via ORAL
  Filled 2012-08-20 (×4): qty 2

## 2012-08-20 MED ORDER — CITALOPRAM HYDROBROMIDE 10 MG PO TABS
10.0000 mg | ORAL_TABLET | Freq: Every day | ORAL | Status: DC
  Administered 2012-08-20 – 2012-08-22 (×3): 10 mg via ORAL
  Filled 2012-08-20 (×4): qty 0.5
  Filled 2012-08-20: qty 1

## 2012-08-20 MED ORDER — TRAZODONE HCL 100 MG PO TABS: 100.0000 mg | ORAL_TABLET | Freq: Every evening | ORAL | Status: DC | PRN

## 2012-08-20 NOTE — [facility] Suicide Risk Assessment (Signed)
Suicide Risk Assessment  Admission Assessment     Nursing information obtained from:    Demographic factors:   [place] female Current Mental Status: Distressed, passive suicidal ideations   Loss Factors:   Loss of relationship, homelessness Historical Factors:   Chronic depression, poor support Risk Reduction Factors:   desire for improvement  CLINICAL FACTORS:   Depression:   Anhedonia Hopelessness Impulsivity Insomnia  COGNITIVE FEATURES THAT CONTRIBUTE TO RISK:  Thought constriction (tunnel vision)    SUICIDE RISK:   Mild:  Suicidal ideation of limited frequency, intensity, duration, and specificity.  There are no identifiable plans, no associated intent, mild dysphoria and related symptoms, good self-control (both objective and subjective assessment), few other risk factors, and identifiable protective factors, including available and accessible social support.  PLAN OF CARE: Restart home medications targeting mood.   Katrina Kelley 08/20/2012, 10:00 AM

## 2012-08-20 NOTE — Progress Notes (Signed)
D:  Patient up and in the milieu most of the day.  Affect flat, sad.  She asks about restarting her home medications and provided me with her pharmacy information.  She indicates on her self inventory that she is having off and on thoughts of suicide or self harm.   A:  Call placed to [facility] on [address] to get an up to date medication list.  Provided list to MD.  Medications started as ordered.  Encouraged patient to attend groups and to come to staff if she was feeling unsafe.   R:  Pleasant and cooperative.  Interacting well with staff and peers.  Remains sad, but is opening up and talking more today.  Remains concerned about housing.

## 2012-08-20 NOTE — Progress Notes (Signed)
Psychoeducational Group Note  Date:  08/20/2012 Time:  1100  Group Topic/Focus:  Recovery Goals:   The focus of this group is to identify appropriate goals for recovery and establish a plan to achieve them.  Participation Level:  Active  Participation Quality:  Appropriate and Attentive  Affect:  Appropriate and Depressed  Cognitive:  Alert, Appropriate and Oriented  Insight:  Good  Engagement in Group:  Good  Additional Comments:  Talked about recovery being a day to day process with small steps. Plans to try to decrease the chaos in her life. Set goals to be substance free and avoid negative people.  Ralph Leyden 08/20/2012, 1:46 PM
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
## 2012-08-20 NOTE — Progress Notes (Signed)
Pt observed in her room in bed.  She easily responds to her name.  Pt reports she came in this morning.  She says she has attended most of the groups today, but says her back is hurting and she is tired.  She asks to be excused from group tonight.  She denies SI/HI/AV. Encouraged pt to make her needs known to staff and follow her plan of care.  Pt voices understanding.  Encouragement/support given.  Safety maintained with q15 minute checks.
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
## 2012-08-20 NOTE — Treatment Plan (Signed)
Interdisciplinary Treatment Plan Update (Adult)  Date: 08/20/2012  Time Reviewed: 1:06 PM   Progress in Treatment: Attending groups: Yes Participating in groups: Yes Taking medication as prescribed: Yes Tolerating medication: Yes   Family/Significant other contact made:  No Patient understands diagnosis:  Yes  As evidenced by asking for help with substance abuse, depression and getting into a shelter Discussing patient identified problems/goals with staff:  Yes  See below Medical problems stabilized or resolved:  Yes Denies suicidal/homicidal ideation: No  On and off  No plan  Contracts for safety Issues/concerns per patient self-inventory:  Yes  C/O poor sleep, backache  Depression and hopelessness are 8's "I have no safe place to go." Other:  New problem(s) identified: N/A  Reason for Continuation of Hospitalization: Anxiety Depression Medication stabilization Suicidal ideation  Interventions implemented related to continuation of hospitalization:  Restart home meds  Encourage group attendance and participation  Additional comments:  Estimated length of stay: 2-3 days Discharge Plan: see below  New goal(s): N/A  Review of initial/current patient goals per problem list:    1.  Goal(s):Eliminate SI  Met:  No  Target date:11/8  As evidenced ZO:XWRU report  2.  Goal (s): Stabilize mood  Met:  No  Target date:11/8  As evidenced by:Ilina will rate her depression and anxiety at a 4 or less  3.  Goal(s): Identify safe place to land post d/c  Met:  No  Target date:11/8  As evidenced EA:[id number] of bed/likely at shelter  4.  Goal(s):  Met:  No  Target date:  As evidenced by:  Attendees: Patient:  Katrina Kelley 08/20/2012 1:06 PM  Family:     Physician:  Dr Daleen Bo 08/20/2012 1:06 PM   Nursing:    08/20/2012 1:06 PM   Clinical Social Worker:  Richelle Ito 08/20/2012 1:06 PM   Extender:   08/20/2012 1:06 PM   Other:     Other:     Other:     Other:       Scribe for Treatment Team:   Ida Rogue, 08/20/2012 1:06 PM
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
## 2012-08-21 NOTE — Discharge Planning (Signed)
Pt attended morning aftercare planning group. She presented with appropriate affect and remained attentive and supportive to other patients throughout group. Pt reported feeling motivated and focused this morning thanks to the support of SW in helping to connect her with housing resources. Pt stated that she had been calling shelters in [facility] and [place] last night and this morning, but no openings are available at this time. She has f/u appt with Horald Pollen at [facility] on Nov 12th and reported that she attends group therapy with Vanetta Mulders MSW in [place], but could not remember the name of agency. Pt will continue calling shelters throughout the day and SW will check in with pt this afternoon. Pt PSA completed. After lunch, Katrina Kelley excitedly informed me that she reached out to a friend who will allow her to stay there temporarily until she gets into the shelter in [place] or [place].  Would like to d/c today.  "I want someone who really needs it to get my bed here."  I assured her no d/c today, but that I would talk to the Dr and advocate for her d/c tomorrow.

## 2012-08-21 NOTE — Progress Notes (Signed)
Pt reports she has had a good day.  She is happy/hopeful because she found someone to stay with until she can get into a shelter or halfway house.  She denies SI/HI/AV.  She states she is ready for discharge.  She still wants to eventually get to her daughter's house in [place] to be close to family.  She has been pleasant/appropriate on the unit.  She is in the dayroom waiting for evening group to start.  Pt makes her needs known to staff.  Pt voices no needs/concerns at this time.  Safety maintained with q15 minute checks.

## 2012-08-21 NOTE — Progress Notes (Signed)
Psychoeducational Group Note  Date:  08/21/2012 Time:  2000  Group Topic/Focus:  Wrap-Up Group:   The focus of this group is to help patients review their daily goal of treatment and discuss progress on daily workbooks.  Participation Level:  Active  Participation Quality:  Sharing  Affect:  Appropriate  Cognitive:  Appropriate  Insight:  Good  Engagement in Group:  Good  Additional Comments:  Patient shared that she thinks her medication is working and that her depression feels like it is at a "0."  Teagon Kron, Newton Pigg 08/21/2012, 10:24 PM

## 2012-08-21 NOTE — Progress Notes (Addendum)
Grief and Loss Group  Several Members participated in sharing ways in which they were experiencing loss and how they were experiencing grief. They talked about the importance of support and how difficult it is when those closest to them are not supportive. Several talked about the support they feel from each other and having the ability to come into the hospital when life becomes too difficult.   Pt actively participated in the group. She talked about multiple losses in her life related to relationships with family and others, the loss of opportunities for her life goals which she attributed to choices which were destructive. She seemed articulate and self aware, but also indicated that she has a hard time sustaining when she doesn't have support  On the outside.   Dyke Maes.

## 2012-08-21 NOTE — Social Work (Signed)
[facility] Group Notes:  (Counselor/Nursing/MHT/Case Management/Adjunct)      Emotional Regulation 1:15 - 2:30 PM          Discharge Planning Group 8:30 - 9: 30 AM  08/21/2012 2:45 PM   Type of Therapy: Group  Participation Level:  Active  Participation Quality:  Attentive  Affect:  Appropriate  Cognitive:  Alert and Appropriate  Insight:  Good  Engagement in Group:  Good  Engagement in Therapy:  Good  Modes of Intervention:  Education, Problem-solving, Support and Exploration  Summary of Progress/Problems:  Katrina Kelley stated that she deals with emotional pain and guilt due to losing custody of her children.  She shared she is working to move forward with her life.   Katrina Kelley 08/21/2012 2:45 PM

## 2012-08-21 NOTE — Progress Notes (Signed)
[facility] Adult Inpatient Family/Significant Other Suicide Prevention Education  Suicide Prevention Education:  Education Completed; Katrina Kelley, friend, [phone] 2021, has been identified by the patient as the family member/significant other with whom the patient will be residing, and identified as the person(s) who will aid the patient in the event of a mental health crisis (suicidal ideations/suicide attempt).  With written consent from the patient, the family member/significant other has been provided the following suicide prevention education, prior to the and/or following the discharge of the patient.  The suicide prevention education provided includes the following:  Suicide risk factors  Suicide prevention and interventions  National Suicide Hotline telephone number  [facility] assessment telephone number  [facility] Emergency Assistance 911  [facility] and/or Residential Mobile Crisis Unit telephone number  Request made of family/significant other to:  Remove weapons (e.g., guns, rifles, knives), all items previously/currently identified as safety concern.    Remove drugs/medications (over-the-counter, prescriptions, illicit drugs), all items previously/currently identified as a safety concern.  The family member/significant other verbalizes understanding of the suicide prevention education information provided.  The family member/significant other agrees to remove the items of safety concern listed above.  Mr Katrina Kelley and his girlfriend are willing to allow Katrina Kelley to stay there temporarily until she gets into a shelter or halfway house.  I encouraged him to set a strict time frame and expectations in anticipation of her time with them.    Katrina Kelley B 08/21/2012, 2:15 PM

## 2012-08-21 NOTE — Progress Notes (Signed)
  [facility] Group Notes:  (Counselor/Nursing/MHT/Case Management/Adjunct)  08/21/2012 4:26 PM  Type of Therapy:  Psychoeducational Skills  Participation Level:  Active  Participation Quality:  Intrusive, Redirectable and Sharing  Affect:  Irritable  Cognitive:  Appropriate and Oriented  Insight:  Good  Engagement in Group:  Limited  Engagement in Therapy:  n/a  Modes of Intervention:  Activity, Education, Problem-solving, Socialization and Support  Summary of Progress/Problems: Cerra attended a Psychoeducational group that focused on using quality time with support systems/individuals to engage in healthy coping skills. Dabria participated in an activity guessing about self and peers, was distracted through group, reading her workbook and shouting out, "I need to see a doctor" and "those nurses need to top bossing me around". Nefertiti was active and insightful while group discussed who their support systems are, how they can spend positive quality time with them as a coping skills and a way to strengthen their relationship. Cilicia discussed how she used to have a friend that enabled her alcoholism. Laurna was given a homework assignment to find two ways to improve her support systems and twenty activities she can do to spend quality time with her supports.   Wandra Scot 08/21/2012, 4:26 PM
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
## 2012-08-21 NOTE — Progress Notes (Signed)
Psychoeducational Group Note  Date:  08/21/2012 Time: 2000  Group Topic/Focus:  Wrap-Up Group:   The focus of this group is to help patients review their daily goal of treatment and discuss progress on daily workbooks.  Participation Level:  Active  Participation Quality:  Appropriate and Sharing  Affect:  Appropriate  Cognitive:  Appropriate  Insight:  Good  Engagement in Group:  Good  Additional Comments:  Patient stated that she has freed herself today from bottled up emotions. Patient stated that she prayed and surrendered herself to god prior to attending groups. Patient is learning to connect with peers. Patients goal was to be honest about the problems in her life and get in contact with people outside of [facility]  Rodman Pickle R 08/21/2012, 2:59 AM

## 2012-08-21 NOTE — Progress Notes (Signed)
D: Patient denies SI/HI and A/V hallucinations; patient reports sleep to be; reports appetite to be ; reports energy level is ; reports ability to pay attention to; rates depression as 0/10; rates hopelessness 0/10; rates anxiety as 0/10;   A: Monitored q 15 minutes; patient encouraged to attend groups; patient educated about medications; patient given medications per physician orders; patient encouraged to express feelings and/or concerns; prn medications  R: Patient is cooperative and animated and needy at times; patient's interaction with staff and peers is appropriate;  patient is taking medications as prescribed and tolerating medications; patient is attending all groups

## 2012-08-21 NOTE — Progress Notes (Signed)
[facility] MD Progress Note  08/21/2012 1:58 PM Katrina Kelley  MRN:  [id number]  S: patient tolerating medications well, mood improving. planning discharge housing options.  Diagnosis:   Axis I: Major Depression, Recurrent severe Axis II: No diagnosis Axis III:  Past Medical History  Diagnosis Date  . Bipolar 1 disorder   . Depression    Axis IV: housing problems and occupational problems Axis V: 51-60 moderate symptoms  ADL's:  Intact  Sleep: Fair  Appetite:  Fair   Mental Status Examination/Evaluation: Objective:  Appearance: Casual  Eye Contact::  Good  Speech:  Clear and Coherent  Volume:  Increased  Mood:  Euphoric  Affect:  Appropriate  Thought Process:  Coherent  Orientation:  Full  Thought Content:  WDL  Suicidal Thoughts:  No  Homicidal Thoughts:  No  Memory:  Immediate;   Fair Recent;   Fair Remote;   Fair  Judgement:  Fair  Insight:  Fair  Psychomotor Activity:  Normal  Concentration:  Fair  Recall:  Fair  Akathisia:  No  Handed:  Right  AIMS (if indicated):     Assets:  Communication Skills  Sleep:  Number of Hours: 6.75    Vital Signs:Blood pressure 119/85, pulse 85, temperature 97.9 F (36.6 C), temperature source Oral, resp. rate 16, height 5' 9.75" (1.772 m), weight 91.627 kg (202 lb), last menstrual period 03/19/1985. Current Medications: Current Facility-Administered Medications  Medication Dose Route Frequency Provider Last Rate Last Dose  . acetaminophen (TYLENOL) tablet 650 mg  650 mg Oral Q6H PRN Verne Spurr, PA-C   650 mg at 08/20/12 0646  . acetaminophen (TYLENOL) tablet 650 mg  650 mg Oral Q6H PRN Chandan Fly, MD      . alum & mag hydroxide-simeth (MAALOX/MYLANTA) 200-200-20 MG/5ML suspension 30 mL  30 mL Oral Q4H PRN Verne Spurr, PA-C      . citalopram (CELEXA) tablet 10 mg  10 mg Oral Daily Cyriah Childrey, MD   10 mg at 08/21/12 0742  . magnesium hydroxide (MILK OF MAGNESIA) suspension 30 mL  30 mL Oral Daily PRN Verne Spurr, PA-C      . meloxicam (MOBIC) tablet 15 mg  15 mg Oral Daily Pascuala Klutts, MD   15 mg at 08/21/12 0742  . multivitamin with minerals tablet 1 tablet  1 tablet Oral Daily Adler Chartrand, MD   1 tablet at 08/21/12 0742  . OLANZapine (ZYPREXA) tablet 10 mg  10 mg Oral QHS Khalel Alms, MD   10 mg at 08/20/12 2155  . traZODone (DESYREL) tablet 50 mg  50 mg Oral QHS PRN Goldia Ligman, MD        Lab Results:  Results for orders placed during the hospital encounter of 08/19/12 (from the past 48 hour(s))  URINALYSIS, ROUTINE W REFLEX MICROSCOPIC     Status: Normal   Collection Time   08/19/12  6:33 PM      Component Value Range Comment   Color, Urine YELLOW  YELLOW    APPearance CLEAR  CLEAR    Specific Gravity, Urine 1.020  1.005 - 1.030    pH 6.5  5.0 - 8.0    Glucose, UA NEGATIVE  NEGATIVE mg/dL    Hgb urine dipstick NEGATIVE  NEGATIVE    Bilirubin Urine NEGATIVE  NEGATIVE    Ketones, ur NEGATIVE  NEGATIVE mg/dL    Protein, ur NEGATIVE  NEGATIVE mg/dL    Urobilinogen, UA 1.0  0.0 - 1.0 mg/dL    Nitrite NEGATIVE  NEGATIVE    Leukocytes, UA NEGATIVE  NEGATIVE MICROSCOPIC NOT DONE ON URINES WITH NEGATIVE PROTEIN, BLOOD, LEUKOCYTES, NITRITE, OR GLUCOSE <1000 mg/dL.  URINE RAPID DRUG SCREEN (HOSP PERFORMED)     Status: Abnormal   Collection Time   08/19/12  6:33 PM      Component Value Range Comment   Opiates NONE DETECTED  NONE DETECTED    Cocaine POSITIVE (*) NONE DETECTED    Benzodiazepines NONE DETECTED  NONE DETECTED    Amphetamines NONE DETECTED  NONE DETECTED    Tetrahydrocannabinol NONE DETECTED  NONE DETECTED    Barbiturates NONE DETECTED  NONE DETECTED   CBC     Status: Normal   Collection Time   08/19/12  7:39 PM      Component Value Range Comment   WBC 5.6  4.0 - 10.5 K/uL    RBC 4.61  3.87 - 5.11 MIL/uL    Hemoglobin 14.1  12.0 - 15.0 g/dL    HCT 16.1  09.6 - 04.5 %    MCV 86.8  78.0 - 100.0 fL    MCH 30.6  26.0 - 34.0 pg    MCHC 35.3  30.0 - 36.0 g/dL     RDW 40.9  81.1 - 91.4 %    Platelets 195  150 - 400 K/uL   COMPREHENSIVE METABOLIC PANEL     Status: Abnormal   Collection Time   08/19/12  7:39 PM      Component Value Range Comment   Sodium 136  135 - 145 mEq/L    Potassium 3.8  3.5 - 5.1 mEq/L    Chloride 101  96 - 112 mEq/L    CO2 25  19 - 32 mEq/L    Glucose, Bld 114 (*) 70 - 99 mg/dL    BUN 12  6 - 23 mg/dL    Creatinine, Ser 7.82  0.50 - 1.10 mg/dL    Calcium 9.1  8.4 - 95.6 mg/dL    Total Protein 8.3  6.0 - 8.3 g/dL    Albumin 3.5  3.5 - 5.2 g/dL    AST 41 (*) 0 - 37 U/L    ALT 29  0 - 35 U/L    Alkaline Phosphatase 73  39 - 117 U/L    Total Bilirubin 0.2 (*) 0.3 - 1.2 mg/dL    GFR calc non Af Amer >90  >90 mL/min    GFR calc Af Amer >90  >90 mL/min   ETHANOL     Status: Normal   Collection Time   08/19/12  7:39 PM      Component Value Range Comment   Alcohol, Ethyl (B) <11  0 - 11 mg/dL   TSH     Status: Normal   Collection Time   08/19/12  7:39 PM      Component Value Range Comment   TSH 1.489  0.350 - 4.500 uIU/mL     Physical Findings: AIMS: Facial and Oral Movements Muscles of Facial Expression: None, normal Lips and Perioral Area: None, normal Jaw: None, normal Tongue: None, normal,Extremity Movements Upper (arms, wrists, hands, fingers): None, normal Lower (legs, knees, ankles, toes): None, normal, Trunk Movements Neck, shoulders, hips: None, normal, Overall Severity Severity of abnormal movements (highest score from questions above): None, normal Incapacitation due to abnormal movements: None, normal Patient's awareness of abnormal movements (rate only patient's report): No Awareness, Dental Status Current problems with teeth and/or dentures?: Yes (had 14 teeth pulled) Does patient usually wear dentures?: No (  in process of getting denture/partial)  CIWA:  CIWA-Ar Total: 0  COWS:  COWS Total Score: 0   Treatment Plan Summary: Daily contact with patient to assess and evaluate symptoms and progress in  treatment Medication management  Plan: Continue current plan of care.  Katrina Kelley 08/21/2012, 1:58 PM

## 2012-08-22 MED ORDER — CITALOPRAM HYDROBROMIDE 10 MG PO TABS: 10.0000 mg | ORAL_TABLET | Freq: Every day | ORAL | Status: DC

## 2012-08-22 MED ORDER — TRAZODONE HCL 50 MG PO TABS: 50.0000 mg | ORAL_TABLET | Freq: Every evening | ORAL | Status: DC | PRN

## 2012-08-22 MED ORDER — OLANZAPINE 10 MG PO TABS: 10.0000 mg | ORAL_TABLET | Freq: Every day | ORAL | Status: DC

## 2012-08-22 NOTE — Progress Notes (Signed)
Discharge Note:  Patient discharged home with friend.  Patient denied SI and HI.   Denied A/V hallucinations.   Denied pain.  Patient stated she received all her belongings, clothing, boots, purse, wallet, ID's, phone, charger, miscellaneous items, medications, prescriptions.  Patient stated she received all her discharge instructions and suicide prevention information which she understands and has no questions.  Patient stated she appreciated all the assistance received from staff while at [facility].   Patient has been cooperative and pleasant.  Patient stated she does not have a job, that she is disabled, no children that she needs to take care of at this time, does not have assess to firearms.

## 2012-08-22 NOTE — Progress Notes (Signed)
D:  Patient's self inventory sheet, patient sleeps well, has good appetite, normal energy level, good attention span.  Rated depression and hopelessness #1.  [place] SI.  Denied physical problems.  Zero pain goal, zero pain.  After discharge, plans to "return to volunteering, remain abstincent from relationship, mood/mind  atering chemical.  List of halfway houses until bed become available."  Does have discharge plans.  No problems taking meds after discharge. A:  Staff will monitor every 15 minutes for safety.  Emotional support and encouragement given patient.  Scheduled medications administered per MD order. R:  Patient receptive to treatment plan.  Denied SI and HI.  Denied A/V hallucinations.  Patient remains safe on unit.  Patient is looking forward to her discharge today.

## 2012-08-22 NOTE — Progress Notes (Signed)
Psychoeducational Group Note  Date:  08/22/2012 Time:  1000  Group Topic/Focus:  Goals Group:   The focus of this group is to help patients establish daily goals to achieve during treatment and discuss how the patient can incorporate goal setting into their daily lives to aide in recovery.  Participation Level:  Active  Participation Quality:  Appropriate, Attentive, Sharing and Supportive  Affect:  Appropriate  Cognitive:  Alert, Appropriate and Oriented  Insight:  Good  Engagement in Group:  Good  Additional Comments:  Patient verbalizes her goal for today is "not to have unrealistic expectations from others." Patient wants to go home today and have "everything fall into place."   Noah Charon 08/22/2012, 11:20 AM

## 2012-08-22 NOTE — [facility] Suicide Risk Assessment (Signed)
Suicide Risk Assessment  Discharge Assessment     Demographic Factors:  Female, African american  Mental Status Per Nursing Assessment::   On Admission:     Current Mental Status by Physician: NA Patient alert and oriented to 4. Mood improved significantly. Denies AH/VH/SI/HI.  Loss Factors: Loss of significant relationship  Historical Factors: Family history of mental illness or substance abuse and Impulsivity  Risk Reduction Factors:   Positive social support  Continued Clinical Symptoms:  Depression:   Recent sense of peace/wellbeing  Cognitive Features That Contribute To Risk:  Cognitively intact   Suicide Risk:  Minimal: No identifiable suicidal ideation.  Patients presenting with no risk factors but with morbid ruminations; may be classified as minimal risk based on the severity of the depressive symptoms  Discharge Diagnoses:   AXIS I:  Depressive Disorder NOS AXIS II:  No diagnosis AXIS III:   Past Medical History  Diagnosis Date  . Bipolar 1 disorder   . Depression    AXIS IV:  housing problems and occupational problems AXIS V:  61-70 mild symptoms  Plan Of Care/Follow-up recommendations:  Activity:  normal Diet:  normal Follow up with outpatient appointments and medications. Is patient on multiple antipsychotic therapies at discharge:  No   Has Patient had three or more failed trials of antipsychotic monotherapy by history:  No  Recommended Plan for Multiple Antipsychotic Therapies: NA  Katrina Kelley 08/22/2012, 9:59 AM
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
## 2012-08-27 NOTE — Progress Notes (Signed)
Patient Discharge Instructions:  After Visit Summary (AVS):   Faxed to:  08/27/12 Psychiatric Admission Assessment Note:   Faxed to:  08/27/12 Suicide Risk Assessment - Discharge Assessment:   Faxed to:  08/27/12 Faxed/Sent to the Next Level Care provider:  08/27/12 Faxed to [facility] @ [phone]  Jerelene Redden, 08/27/2012, 2:30 PM

## 2012-09-23 ENCOUNTER — Other Ambulatory Visit: Payer: Self-pay | Admitting: Internal Medicine

## 2012-09-23 DIAGNOSIS — Z1231 Encounter for screening mammogram for malignant neoplasm of breast: Secondary | ICD-10-CM

## 2012-10-25 ENCOUNTER — Ambulatory Visit
Admission: RE | Admit: 2012-10-25 | Discharge: 2012-10-25 | Disposition: A | Discharge status: Home / Self Care | Payer: PRIVATE HEALTH INSURANCE | Source: Ambulatory Visit | Attending: Internal Medicine | Admitting: Internal Medicine

## 2012-10-25 DIAGNOSIS — Z1231 Encounter for screening mammogram for malignant neoplasm of breast: Secondary | ICD-10-CM

## 2012-10-31 ENCOUNTER — Other Ambulatory Visit: Payer: Self-pay | Admitting: Internal Medicine

## 2012-10-31 DIAGNOSIS — R928 Other abnormal and inconclusive findings on diagnostic imaging of breast: Secondary | ICD-10-CM

## 2012-11-08 ENCOUNTER — Other Ambulatory Visit: Payer: Self-pay

## 2012-12-04 ENCOUNTER — Ambulatory Visit
Admission: RE | Admit: 2012-12-04 | Discharge: 2012-12-04 | Disposition: A | Discharge status: Home / Self Care | Payer: PRIVATE HEALTH INSURANCE | Source: Ambulatory Visit | Attending: Internal Medicine | Admitting: Internal Medicine

## 2012-12-04 DIAGNOSIS — R928 Other abnormal and inconclusive findings on diagnostic imaging of breast: Secondary | ICD-10-CM

## 2012-12-19 ENCOUNTER — Emergency Department (HOSPITAL_COMMUNITY)
Admission: EM | Admit: 2012-12-19 | Discharge: 2012-12-19 | Disposition: A | Discharge status: Home / Self Care | Payer: PRIVATE HEALTH INSURANCE | Attending: Emergency Medicine | Admitting: Emergency Medicine

## 2012-12-19 ENCOUNTER — Encounter (HOSPITAL_COMMUNITY): Payer: Self-pay | Admitting: Neurology

## 2012-12-19 ENCOUNTER — Emergency Department (HOSPITAL_COMMUNITY): Payer: PRIVATE HEALTH INSURANCE

## 2012-12-19 DIAGNOSIS — F17200 Nicotine dependence, unspecified, uncomplicated: Secondary | ICD-10-CM | POA: Insufficient documentation

## 2012-12-19 DIAGNOSIS — R072 Precordial pain: Secondary | ICD-10-CM | POA: Insufficient documentation

## 2012-12-19 DIAGNOSIS — R1013 Epigastric pain: Secondary | ICD-10-CM | POA: Insufficient documentation

## 2012-12-19 DIAGNOSIS — R509 Fever, unspecified: Secondary | ICD-10-CM | POA: Insufficient documentation

## 2012-12-19 DIAGNOSIS — Z8659 Personal history of other mental and behavioral disorders: Secondary | ICD-10-CM | POA: Insufficient documentation

## 2012-12-19 DIAGNOSIS — N760 Acute vaginitis: Secondary | ICD-10-CM | POA: Insufficient documentation

## 2012-12-19 DIAGNOSIS — F3289 Other specified depressive episodes: Secondary | ICD-10-CM | POA: Insufficient documentation

## 2012-12-19 DIAGNOSIS — J3489 Other specified disorders of nose and nasal sinuses: Secondary | ICD-10-CM | POA: Insufficient documentation

## 2012-12-19 DIAGNOSIS — Z79899 Other long term (current) drug therapy: Secondary | ICD-10-CM | POA: Insufficient documentation

## 2012-12-19 DIAGNOSIS — J219 Acute bronchiolitis, unspecified: Secondary | ICD-10-CM

## 2012-12-19 DIAGNOSIS — N898 Other specified noninflammatory disorders of vagina: Secondary | ICD-10-CM | POA: Insufficient documentation

## 2012-12-19 DIAGNOSIS — J209 Acute bronchitis, unspecified: Secondary | ICD-10-CM | POA: Insufficient documentation

## 2012-12-19 LAB — BASIC METABOLIC PANEL
CO2: 24 mEq/L (ref 19–32)
Calcium: 9.7 mg/dL (ref 8.4–10.5)
Chloride: 104 mEq/L (ref 96–112)
Glucose, Bld: 114 mg/dL — ABNORMAL HIGH (ref 70–99)
Potassium: 3.8 mEq/L (ref 3.5–5.1)
Sodium: 139 mEq/L (ref 135–145)

## 2012-12-19 LAB — URINALYSIS, ROUTINE W REFLEX MICROSCOPIC
Glucose, UA: NEGATIVE mg/dL
Hgb urine dipstick: NEGATIVE
Leukocytes, UA: NEGATIVE
Protein, ur: NEGATIVE mg/dL
Specific Gravity, Urine: 1.02 (ref 1.005–1.030)
pH: 6 (ref 5.0–8.0)

## 2012-12-19 LAB — CBC WITH DIFFERENTIAL/PLATELET
Eosinophils Absolute: 0.1 10*3/uL (ref 0.0–0.7)
Eosinophils Relative: 2 % (ref 0–5)
HCT: 40.8 % (ref 36.0–46.0)
Lymphocytes Relative: 55 % — ABNORMAL HIGH (ref 12–46)
Lymphs Abs: 2.3 10*3/uL (ref 0.7–4.0)
MCH: 31.5 pg (ref 26.0–34.0)
MCV: 88.7 fL (ref 78.0–100.0)
Monocytes Absolute: 0.4 10*3/uL (ref 0.1–1.0)
Platelets: 187 10*3/uL (ref 150–400)
RBC: 4.6 MIL/uL (ref 3.87–5.11)
WBC: 4.2 10*3/uL (ref 4.0–10.5)

## 2012-12-19 LAB — POCT I-STAT TROPONIN I

## 2012-12-19 LAB — WET PREP, GENITAL
Trich, Wet Prep: NONE SEEN
Yeast Wet Prep HPF POC: NONE SEEN

## 2012-12-19 MED ORDER — ALBUTEROL SULFATE HFA 108 (90 BASE) MCG/ACT IN AERS: 1.0000 | INHALATION_SPRAY | Freq: Four times a day (QID) | RESPIRATORY_TRACT | Status: DC | PRN

## 2012-12-19 MED ORDER — KETOROLAC TROMETHAMINE 60 MG/2ML IM SOLN
60.0000 mg | Freq: Once | INTRAMUSCULAR | Status: AC
  Administered 2012-12-19: 60 mg via INTRAMUSCULAR
  Filled 2012-12-19: qty 2

## 2012-12-19 MED ORDER — BENZONATATE 100 MG PO CAPS: 100.0000 mg | ORAL_CAPSULE | Freq: Three times a day (TID) | ORAL | Status: DC

## 2012-12-19 MED ORDER — METRONIDAZOLE 500 MG PO TABS: 500.0000 mg | ORAL_TABLET | Freq: Two times a day (BID) | ORAL | Status: DC

## 2012-12-19 MED ORDER — IBUPROFEN 600 MG PO TABS: 600.0000 mg | ORAL_TABLET | Freq: Four times a day (QID) | ORAL | Status: DC | PRN
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
## 2012-12-19 NOTE — ED Provider Notes (Signed)
History     CSN: [id number]  Arrival date & time 12/19/12  0809   First MD Initiated Contact with Patient 12/19/12 [phone]      Chief Complaint  Patient presents with  . Cough  . Vaginal Discharge  . Depression    (Consider location/radiation/quality/duration/timing/severity/associated sxs/prior treatment) Patient is a 57 y.o. female presenting with general illness and chest pain. The history is provided by the patient. No language interpreter was used.  Illness  The current episode started 2 days ago. The onset was gradual. The problem occurs continuously. The problem has been gradually worsening. The problem is moderate. Nothing relieves the symptoms. Nothing aggravates the symptoms. Associated symptoms include a fever, congestion, rhinorrhea and cough. Pertinent negatives include no photophobia, no abdominal pain, no diarrhea, no nausea, no vomiting, no headaches, no sore throat, no muscle aches, no neck pain and no eye discharge. The fever has been present for 1 to 2 days. Her temperature was unmeasured prior to arrival. The cough has no precipitants. The cough is productive. Color change with cough: yellow. There is nasal congestion. She has been less active. She has been drinking less than usual and eating less than usual. Urine output has been normal. There were no sick contacts. She has received no recent medical care.  Chest Pain Pain location:  Substernal area and epigastric Pain quality: aching   Pain radiates to:  Upper back Pain radiates to the back: no   Pain severity:  Moderate Onset quality:  Unable to specify Duration: 2-3 days. Timing:  Constant Progression:  Unchanged Chronicity:  New Context: breathing and at rest   Context: no movement, not raising an arm, no stress and no trauma   Relieved by:  Nothing Worsened by:  Deep breathing and coughing Ineffective treatments:  None tried Associated symptoms: cough and fever   Associated symptoms: no abdominal pain, no  back pain, no diaphoresis, no fatigue, no headache, no nausea, no numbness, no palpitations, no shortness of breath, not vomiting and no weakness   Cough:    Cough characteristics:  Productive   Past Medical History  Diagnosis Date  . Bipolar 1 disorder   . Depression     Past Surgical History  Procedure Laterality Date  . Elevation of depressed skull fracture    . Abdominal hysterectomy    . Foot arthrodesis      rt and lt  . Fracture surgery  2005    fx both lower legs   . Tooth extraction  11/27/2011    Procedure: DENTAL RESTORATION/EXTRACTIONS;  Surgeon: Georgia Lopes, DDS;  Location: [facility];  Service: Oral Surgery;  Laterality: N/A;  just extractions    Family History  Problem Relation Age of Onset  . Hyperlipidemia Son     History  Substance Use Topics  . Smoking status: Current Every Day Smoker -- 0.50 packs/day for 39 years    Types: Cigarettes  . Smokeless tobacco: Not on file  . Alcohol Use: 2.4 oz/week    4 Cans of beer per week     Comment: quit 18 mth ago    OB History   Grav Para Term Preterm Abortions TAB SAB Ect Mult Living                  Review of Systems  Constitutional: Positive for fever. Negative for chills, diaphoresis, activity change, appetite change and fatigue.  HENT: Positive for congestion and rhinorrhea. Negative for sore throat, facial swelling, neck pain  and neck stiffness.   Eyes: Negative for photophobia and discharge.  Respiratory: Positive for cough. Negative for chest tightness and shortness of breath.   Cardiovascular: Positive for chest pain. Negative for palpitations and leg swelling.  Gastrointestinal: Negative for nausea, vomiting, abdominal pain and diarrhea.  Endocrine: Negative for polydipsia and polyuria.  Genitourinary: Negative for dysuria, frequency, difficulty urinating and pelvic pain.  Musculoskeletal: Negative for back pain and arthralgias.  Skin: Negative for color change and wound.   Allergic/Immunologic: Negative for immunocompromised state.  Neurological: Negative for facial asymmetry, weakness, numbness and headaches.  Hematological: Does not bruise/bleed easily.  Psychiatric/Behavioral: Negative for confusion and agitation.    Allergies  Demerol and Penicillins  Home Medications   Current Outpatient Rx  Name  Route  Sig  Dispense  Refill  . acetaminophen (TYLENOL) 500 MG tablet   Oral   Take 500 mg by mouth every 6 (six) hours as needed for fever.         Marland Kitchen albuterol (PROVENTIL HFA;VENTOLIN HFA) 108 (90 BASE) MCG/ACT inhaler   Inhalation   Inhale 1-2 puffs into the lungs every 6 (six) hours as needed for wheezing.   1 Inhaler   0   . benzonatate (TESSALON) 100 MG capsule   Oral   Take 1 capsule (100 mg total) by mouth every 8 (eight) hours.   21 capsule   0   . ibuprofen (ADVIL,MOTRIN) 600 MG tablet   Oral   Take 1 tablet (600 mg total) by mouth every 6 (six) hours as needed for pain.   20 tablet   0   . metroNIDAZOLE (FLAGYL) 500 MG tablet   Oral   Take 1 tablet (500 mg total) by mouth 2 (two) times daily. One po bid x 7 days   14 tablet   0     BP 149/109  Pulse 76  Temp(Src) 98.7 F (37.1 C) (Oral)  Resp 16  SpO2 100%  LMP 03/19/1985  Physical Exam  Constitutional: She is oriented to person, place, and time. She appears well-developed and well-nourished. No distress.  HENT:  Head: Normocephalic and atraumatic.  Mouth/Throat: No oropharyngeal exudate.  Eyes: Pupils are equal, round, and reactive to light.  Neck: Normal range of motion. Neck supple.  Cardiovascular: Normal rate, regular rhythm and normal heart sounds.  Exam reveals no gallop and no friction rub.   No murmur heard. Pulmonary/Chest: Effort normal and breath sounds normal. No respiratory distress. She has no wheezes. She has no rales.  Abdominal: Soft. Bowel sounds are normal. She exhibits no distension and no mass. There is no tenderness. There is no rebound and  no guarding.  Genitourinary: Cervix exhibits no motion tenderness, no discharge and no friability. Right adnexum displays no mass, no tenderness and no fullness. Left adnexum displays no mass, no tenderness and no fullness. Vaginal discharge found.  Musculoskeletal: Normal range of motion. She exhibits no edema and no tenderness.  Neurological: She is alert and oriented to person, place, and time.  Skin: Skin is warm and dry.  Psychiatric: She has a normal mood and affect.    ED Course  Procedures (including critical care time)  Labs Reviewed  WET PREP, GENITAL - Abnormal; Notable for the following:    Clue Cells Wet Prep HPF POC FEW (*)    WBC, Wet Prep HPF POC FEW (*)    All other components within normal limits  CBC WITH DIFFERENTIAL - Abnormal; Notable for the following:    Neutrophils Relative  33 (*)    Neutro Abs 1.4 (*)    Lymphocytes Relative 55 (*)    All other components within normal limits  BASIC METABOLIC PANEL - Abnormal; Notable for the following:    Glucose, Bld 114 (*)    All other components within normal limits  GC/CHLAMYDIA PROBE AMP  URINALYSIS, ROUTINE W REFLEX MICROSCOPIC  POCT I-STAT TROPONIN I   Dg Chest 2 View  12/19/2012  *RADIOLOGY REPORT*  Clinical Data: Cough, fever, mid and posterior chest pain, history smoking  CHEST - 2 VIEW  Comparison: 06/28/2011  Findings: Normal heart size, mediastinal contours, and pulmonary vascularity. Minimal tortuosity of thoracic aorta with minimal calcification at aortic arch. Mild chronic bronchitic changes. No acute infiltrate, pleural effusion or pneumothorax. Bones appear demineralized.  IMPRESSION: Mild chronic bronchitic changes.   Original Report Authenticated By: Ulyses Southward, M.D.      1. Acute bronchiolitis   2. Chest pain   3. Bacterial vaginosis      Date: 12/19/2012  Rate: 62   Rhythm: normal sinus rhythm  QRS Axis: normal  Intervals: normal  ST/T Wave abnormalities: nonspecific ST changes, less than 1mm  STE V2, V3  Conduction Disutrbances:none  Narrative Interpretation:   Old EKG Reviewed: unchanged    MDM  Pt is a 57 y.o. female with pertinent PMHX of BPD, depression, hep C, hx of polysubstance abuse who presents with 2-3 days of cough w/ thick yellow sputum, congestion, subjective fever & chills, and constant chest pain w/ radiation to back.  Pain worse w/ cough & deep respiration, location midsternum and low sternal.  No n/v, d/a, dysuria, but has been having yellow/green vagina d/c ahd has hx of BV.  VSS, pt in NAD, lungs clear, abdominal exam benign.  Given CP constant, worse with cough, I believe it is related to respiratory infection and is unlikely to be ACS.  Also doubt PE given nml HR, O2 sat, no LE swelling or hx of PE.  Have ordered CXR, EKG, CBC, BMP, i stat trop, and will do pelvis exam.   11:35Pm CXR c/w bronchitis, trop negative.  CBC, BMP unremarkable.  Wet prep w/ clue cells.  Will treat bronchitis w/ albuterol MDI, tessalon, ibuprofen.  Will treat BV w/ flagy.  Return precautions given for new or worsening symptoms.   1. Acute bronchiolitis   2. Chest pain   3. Bacterial vaginosis      Labs and imaging considered in decision making, reviewed by myself.  Imaging interpreted by radiology. Pt care discussed with my attending, Dr. Fonnie Jarvis.         Toy Cookey, MD 12/19/12 [phone]

## 2012-12-19 NOTE — ED Provider Notes (Signed)
I saw and evaluated the patient, reviewed the resident's note and I agree with the findings and plan.  L:CTA bilat.  Hurman Horn, MD 12/19/12 2109

## 2012-12-19 NOTE — ED Notes (Signed)
Patient transported to X-ray 

## 2013-01-22 ENCOUNTER — Emergency Department (HOSPITAL_COMMUNITY)
Admission: EM | Admit: 2013-01-22 | Discharge: 2013-01-22 | Disposition: A | Discharge status: Home / Self Care | Payer: PRIVATE HEALTH INSURANCE | Attending: Emergency Medicine | Admitting: Emergency Medicine

## 2013-01-22 ENCOUNTER — Encounter (HOSPITAL_COMMUNITY): Payer: Self-pay | Admitting: Emergency Medicine

## 2013-01-22 DIAGNOSIS — F319 Bipolar disorder, unspecified: Secondary | ICD-10-CM | POA: Insufficient documentation

## 2013-01-22 DIAGNOSIS — R350 Frequency of micturition: Secondary | ICD-10-CM | POA: Insufficient documentation

## 2013-01-22 DIAGNOSIS — A599 Trichomoniasis, unspecified: Secondary | ICD-10-CM

## 2013-01-22 DIAGNOSIS — A5901 Trichomonal vulvovaginitis: Secondary | ICD-10-CM | POA: Insufficient documentation

## 2013-01-22 DIAGNOSIS — N390 Urinary tract infection, site not specified: Secondary | ICD-10-CM | POA: Insufficient documentation

## 2013-01-22 DIAGNOSIS — F17200 Nicotine dependence, unspecified, uncomplicated: Secondary | ICD-10-CM | POA: Insufficient documentation

## 2013-01-22 DIAGNOSIS — Z8659 Personal history of other mental and behavioral disorders: Secondary | ICD-10-CM | POA: Insufficient documentation

## 2013-01-22 LAB — URINE MICROSCOPIC-ADD ON

## 2013-01-22 LAB — URINALYSIS, ROUTINE W REFLEX MICROSCOPIC
Bilirubin Urine: NEGATIVE
Glucose, UA: NEGATIVE mg/dL
Nitrite: NEGATIVE
Specific Gravity, Urine: 1.022 (ref 1.005–1.030)
pH: 6 (ref 5.0–8.0)

## 2013-01-22 LAB — WET PREP, GENITAL: Yeast Wet Prep HPF POC: NONE SEEN

## 2013-01-22 MED ORDER — METRONIDAZOLE 500 MG PO TABS: 500.0000 mg | ORAL_TABLET | Freq: Two times a day (BID) | ORAL | Status: DC

## 2013-01-22 MED ORDER — NAPROXEN 500 MG PO TABS: 500.0000 mg | ORAL_TABLET | Freq: Two times a day (BID) | ORAL | Status: DC

## 2013-01-22 MED ORDER — NAPROXEN 250 MG PO TABS
500.0000 mg | ORAL_TABLET | Freq: Once | ORAL | Status: AC
  Administered 2013-01-22: 500 mg via ORAL
  Filled 2013-01-22: qty 2

## 2013-01-22 MED ORDER — CEFTRIAXONE SODIUM 250 MG IJ SOLR
250.0000 mg | Freq: Once | INTRAMUSCULAR | Status: AC
  Administered 2013-01-22: 250 mg via INTRAMUSCULAR
  Filled 2013-01-22: qty 250

## 2013-01-22 MED ORDER — SULFAMETHOXAZOLE-TRIMETHOPRIM 800-160 MG PO TABS: 1.0000 | ORAL_TABLET | Freq: Two times a day (BID) | ORAL | Status: DC

## 2013-01-22 MED ORDER — AZITHROMYCIN 250 MG PO TABS
1000.0000 mg | ORAL_TABLET | Freq: Once | ORAL | Status: AC
  Administered 2013-01-22: 1000 mg via ORAL
  Filled 2013-01-22: qty 4

## 2013-01-22 MED ORDER — LIDOCAINE HCL (PF) 1 % IJ SOLN
INTRAMUSCULAR | Status: AC
  Administered 2013-01-22: 2 mL
  Filled 2013-01-22: qty 5

## 2013-01-22 NOTE — ED Provider Notes (Signed)
History     CSN: [id number]  Arrival date & time 01/22/13  4098   First MD Initiated Contact with Patient 01/22/13 [phone]      Chief Complaint  Patient presents with  . Back Pain  . Urinary Frequency    (Consider location/radiation/quality/duration/timing/severity/associated sxs/prior treatment) HPI Comments: 57 year old female with a history of urinary tract infection and bipolar disorder who presents with a complaint of urinary frequency and a pressure in her lower abdomen when she urinates. The symptoms started 3 days ago, has been intermittent, worse with urination, no abdominal discomfort at rest only with urination. She has associated flank pain right greater than left and has endorsed subjective fevers last night for which she took an antifever medication. She denies nausea or vomiting, shortness of breath or chest pain, rashes, diarrhea or swelling. The symptoms are mild to moderate  Patient is a 57 y.o. female presenting with back pain and frequency. The history is provided by the patient.  Back Pain Urinary Frequency    Past Medical History  Diagnosis Date  . Bipolar 1 disorder   . Depression     Past Surgical History  Procedure Laterality Date  . Elevation of depressed skull fracture    . Abdominal hysterectomy    . Foot arthrodesis      rt and lt  . Fracture surgery  2005    fx both lower legs   . Tooth extraction  11/27/2011    Procedure: DENTAL RESTORATION/EXTRACTIONS;  Surgeon: Georgia Lopes, DDS;  Location: [facility];  Service: Oral Surgery;  Laterality: N/A;  just extractions    Family History  Problem Relation Age of Onset  . Hyperlipidemia Son     History  Substance Use Topics  . Smoking status: Current Every Day Smoker -- 0.50 packs/day for 39 years    Types: Cigarettes  . Smokeless tobacco: Not on file  . Alcohol Use: 2.4 oz/week    4 Cans of beer per week     Comment: quit 18 mth ago    OB History   Grav Para Term Preterm  Abortions TAB SAB Ect Mult Living                  Review of Systems  Genitourinary: Positive for frequency.  Musculoskeletal: Positive for back pain.  All other systems reviewed and are negative.    Allergies  Demerol and Penicillins  Home Medications   Current Outpatient Rx  Name  Route  Sig  Dispense  Refill  . albuterol (PROVENTIL HFA;VENTOLIN HFA) 108 (90 BASE) MCG/ACT inhaler   Inhalation   Inhale 1-2 puffs into the lungs every 6 (six) hours as needed for wheezing.   1 Inhaler   0   . oxyCODONE-acetaminophen (PERCOCET) 10-325 MG per tablet   Oral   Take 1 tablet by mouth every 4 (four) hours as needed for pain.         . metroNIDAZOLE (FLAGYL) 500 MG tablet   Oral   Take 1 tablet (500 mg total) by mouth 2 (two) times daily.   14 tablet   0   . naproxen (NAPROSYN) 500 MG tablet   Oral   Take 1 tablet (500 mg total) by mouth 2 (two) times daily with a meal.   30 tablet   0   . sulfamethoxazole-trimethoprim (SEPTRA DS) 800-160 MG per tablet   Oral   Take 1 tablet by mouth every 12 (twelve) hours.   14 tablet  0     BP 123/91  Pulse 70  Temp(Src) 98.1 F (36.7 C) (Oral)  Resp 18  SpO2 98%  LMP 03/19/1985  Physical Exam  Nursing note and vitals reviewed. Constitutional: She appears well-developed and well-nourished. No distress.  HENT:  Head: Normocephalic and atraumatic.  Mouth/Throat: Oropharynx is clear and moist. No oropharyngeal exudate.  Eyes: Conjunctivae and EOM are normal. Pupils are equal, round, and reactive to light. Right eye exhibits no discharge. Left eye exhibits no discharge. No scleral icterus.  Neck: Normal range of motion. Neck supple. No JVD present. No thyromegaly present.  Cardiovascular: Normal rate, regular rhythm, normal heart sounds and intact distal pulses.  Exam reveals no gallop and no friction rub.   No murmur heard. Pulmonary/Chest: Effort normal and breath sounds normal. No respiratory distress. She has no  wheezes. She has no rales.  Abdominal: Soft. Bowel sounds are normal. She exhibits no distension and no mass. There is no tenderness.  Minimal right CVA tenderness, no abdominal tenderness or distention  Musculoskeletal: Normal range of motion. She exhibits no edema and no tenderness.  Lymphadenopathy:    She has no cervical adenopathy.  Neurological: She is alert. Coordination normal.  Skin: Skin is warm and dry. No rash noted. No erythema.  Psychiatric: She has a normal mood and affect. Her behavior is normal.    ED Course  Procedures (including critical care time)  Labs Reviewed  WET PREP, GENITAL - Abnormal; Notable for the following:    Trich, Wet Prep FEW (*)    Clue Cells Wet Prep HPF POC FEW (*)    WBC, Wet Prep HPF POC TOO NUMEROUS TO COUNT (*)    All other components within normal limits  URINALYSIS, ROUTINE W REFLEX MICROSCOPIC - Abnormal; Notable for the following:    Color, Urine AMBER (*)    APPearance CLOUDY (*)    Leukocytes, UA LARGE (*)    All other components within normal limits  URINE MICROSCOPIC-ADD ON - Abnormal; Notable for the following:    Squamous Epithelial / LPF FEW (*)    All other components within normal limits  GC/CHLAMYDIA PROBE AMP   No results found.   1. UTI (lower urinary tract infection)   2. Trichomonas       MDM  No tachycardia fever or hypotension, there are no surgical criteria present, exam is benign, pelvic exam pending chaperone availability, wet prep, GC Chlamydia, urinalysis.  7:15 AM, chaperone present for exam, normal appearing external genitalia, thin yellowish discharge present in the vaginal vault, no tenderness in the adnexa bilaterally, cervix not palpated, no bleeding.  Pt has trichomonas, UTI, treated and encouraged to return for worsening symptoms.        Vida Roller, MD 01/22/13 [phone]

## 2013-01-22 NOTE — ED Notes (Signed)
Pt  Presented to ED with back pain and complaining of burning sensation in urine.She is also complaining of yellowish discolouration  Vaginal discharge.

## 2013-01-23 ENCOUNTER — Emergency Department (HOSPITAL_COMMUNITY)
Admission: EM | Admit: 2013-01-23 | Discharge: 2013-01-23 | Disposition: A | Discharge status: Home / Self Care | Payer: PRIVATE HEALTH INSURANCE | Attending: Emergency Medicine | Admitting: Emergency Medicine

## 2013-01-23 ENCOUNTER — Encounter (HOSPITAL_COMMUNITY): Payer: Self-pay | Admitting: *Deleted

## 2013-01-23 DIAGNOSIS — L259 Unspecified contact dermatitis, unspecified cause: Secondary | ICD-10-CM | POA: Insufficient documentation

## 2013-01-23 DIAGNOSIS — L309 Dermatitis, unspecified: Secondary | ICD-10-CM

## 2013-01-23 DIAGNOSIS — F17200 Nicotine dependence, unspecified, uncomplicated: Secondary | ICD-10-CM | POA: Insufficient documentation

## 2013-01-23 DIAGNOSIS — Z79899 Other long term (current) drug therapy: Secondary | ICD-10-CM | POA: Insufficient documentation

## 2013-01-23 DIAGNOSIS — Z8659 Personal history of other mental and behavioral disorders: Secondary | ICD-10-CM | POA: Insufficient documentation

## 2013-01-23 LAB — GC/CHLAMYDIA PROBE AMP: GC Probe RNA: NEGATIVE

## 2013-01-23 MED ORDER — DIPHENHYDRAMINE HCL 25 MG PO CAPS
50.0000 mg | ORAL_CAPSULE | Freq: Once | ORAL | Status: AC
  Administered 2013-01-23: 50 mg via ORAL
  Filled 2013-01-23: qty 2

## 2013-01-23 MED ORDER — TRIAMCINOLONE ACETONIDE 0.025 % EX OINT: TOPICAL_OINTMENT | Freq: Two times a day (BID) | CUTANEOUS | Status: DC

## 2013-01-23 MED ORDER — DIPHENHYDRAMINE HCL 25 MG PO TABS: 25.0000 mg | ORAL_TABLET | Freq: Four times a day (QID) | ORAL | Status: DC | PRN

## 2013-01-23 NOTE — ED Notes (Signed)
Pt states she noticed a rash that started yesterday on her chest. Pt states that today the back of her neck, her eyes, were also itching and had hives. Pt denies allergies or changes to daily routine such as laundry detergant, soaps, etc.

## 2013-01-23 NOTE — ED Notes (Signed)
Pt discharged.Vital signs stable and GCS 15 

## 2013-01-23 NOTE — ED Provider Notes (Signed)
History    This chart was scribed for non-physician practitioner working with Mirna Mires, MD by Adriana Reams, ED Scribe. This patient was seen in room TR04C/TR04C and the patient's care was started at 2057.   CSN: [id number]  Arrival date & time 01/23/13  2007   First MD Initiated Contact with Patient 01/23/13 2057      Chief Complaint  Patient presents with  . Rash    The history is provided by the patient. No language interpreter was used.   Katrina Kelley is a 57 y.o. female who presents to the Emergency Department complaining of gradual onset, constant, gradually worsening moderate rash that began yesterday on her chest and has since spread to the back of her neck and are described as itching. She denies allergies or any new exposures. She has tried putting alcohol and peroxide on her rash with no relief and applied a cold compress with mild relief. She states she has a history of eczema, but it has never been this severe. She denies fever, CP, difficulty breathing, neck pain or stiffness, drainage from the rash, numbness or tingling or any other pain.   She was seen in the ED last night for a UTI and reports she has been taking her antibiotics.  Past Medical History  Diagnosis Date  . Bipolar 1 disorder   . Depression     Past Surgical History  Procedure Laterality Date  . Elevation of depressed skull fracture    . Abdominal hysterectomy    . Foot arthrodesis      rt and lt  . Fracture surgery  2005    fx both lower legs   . Tooth extraction  11/27/2011    Procedure: DENTAL RESTORATION/EXTRACTIONS;  Surgeon: [name], DDS;  Location: [facility];  Service: Oral Surgery;  Laterality: N/A;  just extractions    Family History  Problem Relation Age of Onset  . Hyperlipidemia Son     History  Substance Use Topics  . Smoking status: Current Every Day Smoker -- 0.50 packs/day for 39 years    Types: Cigarettes  . Smokeless tobacco: Not on file   . Alcohol Use: 2.4 oz/week    4 Cans of beer per week     Comment: quit 18 mth ago     Review of Systems  Constitutional: Negative for fever.  Respiratory: Negative for shortness of breath and wheezing.   Cardiovascular: Negative for chest pain.  Gastrointestinal: Negative for nausea, vomiting and abdominal pain.  Skin: Positive for rash.  Neurological: Negative for numbness.  All other systems reviewed and are negative.    Allergies  Demerol and Penicillins  Home Medications   Current Outpatient Rx  Name  Route  Sig  Dispense  Refill  . metroNIDAZOLE (FLAGYL) 500 MG tablet   Oral   Take 1 tablet (500 mg total) by mouth 2 (two) times daily.   14 tablet   0   . naproxen (NAPROSYN) 500 MG tablet   Oral   Take 1 tablet (500 mg total) by mouth 2 (two) times daily with a meal.   30 tablet   0   . oxyCODONE-acetaminophen (PERCOCET) 10-325 MG per tablet   Oral   Take 1 tablet by mouth every 4 (four) hours as needed for pain.         Marland Kitchen sulfamethoxazole-trimethoprim (SEPTRA DS) 800-160 MG per tablet   Oral   Take 1 tablet by mouth every 12 (twelve) hours.  14 tablet   0   . diphenhydrAMINE (BENADRYL) 25 MG tablet   Oral   Take 1 tablet (25 mg total) by mouth every 6 (six) hours as needed for itching (Rash).   30 tablet   0   . triamcinolone (KENALOG) 0.025 % ointment   Topical   Apply topically 2 (two) times daily.   30 g   0     BP 148/96  Pulse 82  Temp(Src) 99.2 F (37.3 C) (Oral)  Resp 16  SpO2 99%  LMP 03/19/1985  Physical Exam  Nursing note and vitals reviewed. Constitutional: She is oriented to person, place, and time. She appears well-developed and well-nourished. No distress.  HENT:  Head: Normocephalic and atraumatic.  Mouth/Throat: Oropharynx is clear and moist. No oropharyngeal exudate.  Eyes: Conjunctivae and EOM are normal. No scleral icterus.  Neck: Normal range of motion. Neck supple. No tracheal deviation present.   Cardiovascular: Normal rate, regular rhythm, normal heart sounds and intact distal pulses.   Pulmonary/Chest: Effort normal and breath sounds normal. No respiratory distress. She has no wheezes. She has no rales.  Musculoskeletal: Normal range of motion.  Lymphadenopathy:    She has no cervical adenopathy.  Neurological: She is alert and oriented to person, place, and time.  Skin: Skin is warm and dry. Rash noted.  Mildly erythematous, slightly raised, patches with superificial dryness appreciated at the base of the patient's anterior and posterior neck. No blistering, drainage, warmth, or peeling or sluffing of skin appreciated.  Psychiatric: She has a normal mood and affect. Her behavior is normal.    ED Course  Procedures (including critical care time) DIAGNOSTIC STUDIES: Oxygen Saturation is 99% on room air, normal by my interpretation.    COORDINATION OF CARE: 9:48 PM Discussed treatment plan which includes a steroid cream, facial moisturizer and Benadryl with pt at bedside and pt agreed to plan.    Labs Reviewed - No data to display No results found.   1. Eczema     MDM  Uncomplicated eczema without evidence of cellulitis/infection. Physical exam findings not consistent with erythema multiforme major or minor or red man's syndrome. Patient hemodynamically stable, neurovascularly intact, well and nontoxic appearing, and in NAD. Stable for d/c with triamcinolone ointment as well as benadryl for symptoms. Patient advised to refrain from itching or applying alcohol/peroxide. Indications for ED return discussed. Patient states comfort and understanding with this d/c plan with no unaddressed concerns.  I personally performed the services described in this documentation, which was scribed in my presence. The recorded information has been reviewed and is accurate.          Antonietta Breach, PA-C 01/25/13 2204

## 2013-01-28 NOTE — ED Provider Notes (Signed)
Medical screening examination/treatment/procedure(s) were performed by non-physician practitioner and as supervising physician I was immediately available for consultation/collaboration.   Phyliss Hulick E Texanna Hilburn, MD 01/28/13 1401 

## 2013-02-26 ENCOUNTER — Emergency Department (HOSPITAL_COMMUNITY)
Admission: EM | Admit: 2013-02-26 | Discharge: 2013-02-26 | Disposition: A | Discharge status: Home / Self Care | Payer: PRIVATE HEALTH INSURANCE | Attending: Emergency Medicine | Admitting: Emergency Medicine

## 2013-02-26 ENCOUNTER — Encounter (HOSPITAL_COMMUNITY): Payer: Self-pay | Admitting: *Deleted

## 2013-02-26 DIAGNOSIS — Z8659 Personal history of other mental and behavioral disorders: Secondary | ICD-10-CM | POA: Insufficient documentation

## 2013-02-26 DIAGNOSIS — Z91040 Latex allergy status: Secondary | ICD-10-CM | POA: Insufficient documentation

## 2013-02-26 DIAGNOSIS — F17200 Nicotine dependence, unspecified, uncomplicated: Secondary | ICD-10-CM | POA: Insufficient documentation

## 2013-02-26 DIAGNOSIS — Z880 Allergy status to penicillin: Secondary | ICD-10-CM | POA: Insufficient documentation

## 2013-02-26 DIAGNOSIS — R21 Rash and other nonspecific skin eruption: Secondary | ICD-10-CM

## 2013-02-26 MED ORDER — TRAMADOL HCL 50 MG PO TABS: 50.0000 mg | ORAL_TABLET | Freq: Once | ORAL | Status: DC

## 2013-02-26 MED ORDER — HYDROXYZINE HCL 25 MG PO TABS
25.0000 mg | ORAL_TABLET | Freq: Once | ORAL | Status: DC
  Filled 2013-02-26: qty 1

## 2013-02-26 MED ORDER — TRIAMCINOLONE ACETONIDE 0.025 % EX CREA: 1.0000 "application " | TOPICAL_CREAM | Freq: Three times a day (TID) | CUTANEOUS | Status: DC | PRN

## 2013-02-26 MED ORDER — HYDROXYZINE HCL 25 MG PO TABS
25.0000 mg | ORAL_TABLET | Freq: Once | ORAL | Status: AC
  Administered 2013-02-26: 25 mg via ORAL
  Filled 2013-02-26: qty 1

## 2013-02-26 MED ORDER — HYDROXYZINE HCL 25 MG PO TABS: 25.0000 mg | ORAL_TABLET | Freq: Once | ORAL | Status: DC

## 2013-02-26 MED ORDER — TRAMADOL HCL 50 MG PO TABS
50.0000 mg | ORAL_TABLET | Freq: Once | ORAL | Status: AC
  Administered 2013-02-26: 50 mg via ORAL
  Filled 2013-02-26: qty 1

## 2013-02-26 NOTE — ED Notes (Signed)
Pt states that she has eczema. Pt was given ointment and taking benadryl  with no relief. Pt states that she had a pill in the past that helped.

## 2013-02-26 NOTE — ED Provider Notes (Signed)
History     CSN: [id number]  Arrival date & time 02/26/13  0816   First MD Initiated Contact with Patient 02/26/13 [phone]      Chief Complaint  Patient presents with  . Rash    (Consider location/radiation/quality/duration/timing/severity/associated sxs/prior treatment) HPI Comments: Patient is a 57 year old female with a past medical history of bipolar disorder and eczema who presents with exacerbation of her eczema. Patient reports progressively worsening in the past few days. Symptoms started gradually and progressively worsened. Patient reports itching and swelling of the eczema on her neck and chest. Patient reports being out of her triamcinolone cream and hydroxyzine which have worked in the past for her. No aggravating/alleviating factors. No associated symptoms.    Past Medical History  Diagnosis Date  . Bipolar 1 disorder   . Depression     Past Surgical History  Procedure Laterality Date  . Elevation of depressed skull fracture    . Abdominal hysterectomy    . Foot arthrodesis      rt and lt  . Fracture surgery  2005    fx both lower legs   . Tooth extraction  11/27/2011    Procedure: DENTAL RESTORATION/EXTRACTIONS;  Surgeon: Georgia Lopes, DDS;  Location: [facility];  Service: Oral Surgery;  Laterality: N/A;  just extractions    Family History  Problem Relation Age of Onset  . Hyperlipidemia Son     History  Substance Use Topics  . Smoking status: Current Every Day Smoker -- 0.50 packs/day for 39 years    Types: Cigarettes  . Smokeless tobacco: Not on file  . Alcohol Use: 2.4 oz/week    4 Cans of beer per week     Comment: quit 18 mth ago    OB History   Grav Para Term Preterm Abortions TAB SAB Ect Mult Living                  Review of Systems  Skin: Positive for rash.  All other systems reviewed and are negative.    Allergies  Demerol; Latex; Penicillins; and Strawberry  Home Medications   Current Outpatient Rx  Name   Route  Sig  Dispense  Refill  . oxyCODONE-acetaminophen (PERCOCET) 10-325 MG per tablet   Oral   Take 1 tablet by mouth every 4 (four) hours as needed for pain.         Marland Kitchen triamcinolone (KENALOG) 0.025 % ointment   Topical   Apply topically 2 (two) times daily.   30 g   0     BP 114/81  Pulse 96  Temp(Src) 97.7 F (36.5 C) (Oral)  Resp 18  SpO2 98%  LMP 03/19/1985  Physical Exam  Nursing note and vitals reviewed. Constitutional: She is oriented to person, place, and time. She appears well-developed and well-nourished. No distress.  HENT:  Head: Normocephalic and atraumatic.  Eyes: Conjunctivae are normal.  Neck: Normal range of motion.  Cardiovascular: Normal rate and regular rhythm.  Exam reveals no gallop and no friction rub.   No murmur heard. Pulmonary/Chest: Effort normal and breath sounds normal. She has no wheezes. She has no rales. She exhibits no tenderness.  Abdominal: Soft. There is no tenderness.  Musculoskeletal: Normal range of motion.  Neurological: She is alert and oriented to person, place, and time. Coordination normal.  Speech is goal-oriented. Moves limbs without ataxia.   Skin: Skin is warm and dry.  Darker pigmented and erythematous area around neck and chest  with a macular rash noted.    Psychiatric: She has a normal mood and affect. Her behavior is normal.    ED Course  Procedures (including critical care time)  Labs Reviewed - No data to display No results found.   1. Rash       MDM  9:02 AM Patient will have hydroxyzine right now and be prescribed the same as well as kenalog cream for rash. Patient instructed to follow up with her PCP for further evaluation and management. Vitals stable and patient afebrile.        Emilia Beck, [place] 02/26/13 [phone]

## 2013-02-26 NOTE — ED Provider Notes (Signed)
Medical screening examination/treatment/procedure(s) were performed by non-physician practitioner and as supervising physician I was immediately available for consultation/collaboration.   Carleene Cooper III, MD 02/26/13 [phone]

## 2013-10-04 ENCOUNTER — Emergency Department (HOSPITAL_COMMUNITY): Payer: PRIVATE HEALTH INSURANCE

## 2013-10-04 ENCOUNTER — Encounter (HOSPITAL_COMMUNITY): Payer: Self-pay | Admitting: Emergency Medicine

## 2013-10-04 ENCOUNTER — Emergency Department (HOSPITAL_COMMUNITY)
Admission: EM | Admit: 2013-10-04 | Discharge: 2013-10-04 | Disposition: A | Discharge status: Home / Self Care | Payer: PRIVATE HEALTH INSURANCE | Attending: Emergency Medicine | Admitting: Emergency Medicine

## 2013-10-04 DIAGNOSIS — S92253P Displaced fracture of navicular [scaphoid] of unspecified foot, subsequent encounter for fracture with malunion: Secondary | ICD-10-CM

## 2013-10-04 DIAGNOSIS — F17200 Nicotine dependence, unspecified, uncomplicated: Secondary | ICD-10-CM | POA: Insufficient documentation

## 2013-10-04 DIAGNOSIS — Z91040 Latex allergy status: Secondary | ICD-10-CM | POA: Insufficient documentation

## 2013-10-04 DIAGNOSIS — Z8659 Personal history of other mental and behavioral disorders: Secondary | ICD-10-CM | POA: Insufficient documentation

## 2013-10-04 DIAGNOSIS — IMO0002 Reserved for concepts with insufficient information to code with codable children: Secondary | ICD-10-CM | POA: Insufficient documentation

## 2013-10-04 DIAGNOSIS — G8929 Other chronic pain: Secondary | ICD-10-CM | POA: Insufficient documentation

## 2013-10-04 DIAGNOSIS — Z880 Allergy status to penicillin: Secondary | ICD-10-CM | POA: Insufficient documentation

## 2013-10-04 MED ORDER — OXYCODONE-ACETAMINOPHEN 5-325 MG PO TABS
1.0000 | ORAL_TABLET | Freq: Once | ORAL | Status: AC
  Administered 2013-10-04: 1 via ORAL
  Filled 2013-10-04: qty 1

## 2013-10-04 MED ORDER — OXYCODONE-ACETAMINOPHEN 10-325 MG PO TABS: 1.0000 | ORAL_TABLET | ORAL | Status: DC | PRN

## 2013-10-04 NOTE — ED Notes (Signed)
The pt is c/o rt foot swelling for 2 weeks.  She has had foot surgery in 2006 for fractures.   The foot swells intermittently.   Swollen rt foot and ankle with pain
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
## 2013-10-04 NOTE — Progress Notes (Signed)
Orthopedic Tech Progress Note Patient Details:  Katrina Kelley February 09, 1956 [id number] Ace Wrap and CAM walker applied to Right LE. Application tolerated well. Patient care instructions provided.  Ortho Devices Type of Ortho Device: CAM walker;Ace wrap Ortho Device/Splint Location: Right LE Ortho Device/Splint Interventions: Application   Asia R Thompson 10/04/2013, 11:41 AM

## 2013-10-05 NOTE — ED Provider Notes (Signed)
Medical screening examination/treatment/procedure(s) were conducted as a shared visit with non-physician practitioner(s) and myself.  I personally evaluated the patient during the encounter.  EKG Interpretation   None       Pt c/o intermittent chronic right foot pain and swelling post injury 2006 that never healed completely. Mild-mod swelling noted to foot. No erythema or increased warmth. Distal pulses palp. Berlin Hun, MD 10/05/13 [phone]

## 2013-10-17 ENCOUNTER — Ambulatory Visit: Payer: Self-pay | Admitting: Podiatrist

## 2013-10-27 ENCOUNTER — Encounter: Payer: Self-pay | Admitting: Podiatrist

## 2013-11-07 ENCOUNTER — Encounter: Payer: Self-pay | Admitting: Podiatrist

## 2013-11-07 ENCOUNTER — Ambulatory Visit (INDEPENDENT_AMBULATORY_CARE_PROVIDER_SITE_OTHER): Payer: Medicare Other | Admitting: Podiatrist

## 2013-11-07 VITALS — BP 120/94 | HR 80 | Resp 18

## 2013-11-07 DIAGNOSIS — M19079 Primary osteoarthritis, unspecified ankle and foot: Secondary | ICD-10-CM

## 2013-11-07 MED ORDER — OXYCODONE-ACETAMINOPHEN 10-325 MG PO TABS: 1.0000 | ORAL_TABLET | ORAL | Status: DC | PRN

## 2013-11-07 MED ORDER — MELOXICAM 15 MG PO TABS
15.0000 mg | ORAL_TABLET | Freq: Every day | ORAL | Status: DC
Start: 2013-11-07 — End: 2014-03-11

## 2013-11-07 NOTE — Progress Notes (Signed)
Subjective: Katrina Kelley presents today for right foot pain. She states "I went to  [facility] about 2 weeks ago for my right foot and they did x-rays and a CAT scan and they said arthritis had set in and there was some inflammation and that the pin had separated from the bone on my right foot ".  She states she continues to have pain and she points to the mid arch region of the right foot around the medial cuneiform joint. She denies any trauma or injury to the foot. She had a cotton plantar flexory osteotomy performed 10/27/2011. She states that the foot continued to swell and that never really felt much better.She went to [facility] due to pain in the foot and while there xrays were taken.  They medial cuneiform was suspicious and therefore a CT scan was performed.    Objective:  Neurovascular status intact with palpable pedal pulses and neurological status intact to the right foot.  The patient complains of pain at the medial cuneiform region both dorsally and plantarly.  She states the foot swells and has really never gone down status post the cotton osteotomy surgery.    Xray and CT report reviewed and read as a "nonunion with pseudoarthrosis of comminuted fracture of the superior aspect of the medial cuneiform."  Overlying soft tissue changes were also noted.  Assessment:  Non-union dorsal aspect of cotton osteotomy  Plan:  Discussed with Katrina Kelley that I would like to investigate the CT scan and look at the images to see the extent of the non-union and psudoarthrosis.  I did discuss the possibliity of surgery to remove the necrotic piece of bone and to put a plate and screws in its place to stabilize the bone.  I will see Katrina Kelley back in 2 weeks and we will have a plan for surgical correction at that time.  In the mean time a rx for oxycodone for pain and meloxicam for the swelling was prescribed.  A removable plantar fascial strapping was also applied.

## 2013-11-21 ENCOUNTER — Ambulatory Visit: Payer: Medicare Other | Admitting: Podiatrist

## 2014-01-02 ENCOUNTER — Ambulatory Visit: Payer: Medicare Other | Admitting: Podiatrist

## 2014-01-30 ENCOUNTER — Encounter: Payer: Self-pay | Admitting: Podiatrist

## 2014-01-30 ENCOUNTER — Telehealth: Payer: Self-pay | Admitting: *Deleted

## 2014-01-30 ENCOUNTER — Ambulatory Visit (INDEPENDENT_AMBULATORY_CARE_PROVIDER_SITE_OTHER): Payer: Medicare Other | Admitting: Podiatrist

## 2014-01-30 VITALS — BP 98/67 | HR 78 | Resp 16

## 2014-01-30 DIAGNOSIS — M19079 Primary osteoarthritis, unspecified ankle and foot: Secondary | ICD-10-CM

## 2014-01-30 MED ORDER — OXYCODONE-ACETAMINOPHEN 10-325 MG PO TABS: 1.0000 | ORAL_TABLET | ORAL | Status: DC | PRN

## 2014-01-30 NOTE — Telephone Encounter (Signed)
Patient was referred to Dr. Sheran Lawless Dial for surgical consultation  Dr. Sheran Lawless Dial [address] [place], [place] Ph: [phone]   Appointment:  02/02/14 Arrival Time:  10:20am

## 2014-01-30 NOTE — Patient Instructions (Signed)
I am referring  You to a podiatrist in [facility]-- Dr. Nigel Sloop Dial.  He is with [facility].
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
## 2014-02-02 ENCOUNTER — Telehealth: Payer: Self-pay | Admitting: *Deleted

## 2014-02-02 ENCOUNTER — Other Ambulatory Visit: Payer: Self-pay | Admitting: Nurse Practitioner

## 2014-02-02 DIAGNOSIS — C220 Liver cell carcinoma: Secondary | ICD-10-CM

## 2014-02-02 NOTE — Telephone Encounter (Signed)
Message copied by Lolita Rieger on Mon Feb 02, 2014 10:15 AM ------      Message from: Bronson Ing      Created: Sun Feb 01, 2014  9:37 PM      Regarding: note to fax over to Dr. Delora Fuel monday am       Alcides Nutting,            Can you fax over my most recent note to Dr. Hermina Barters office in [place] on Monday morning before Ernesha's consult?            Thanks! ------

## 2014-02-02 NOTE — Telephone Encounter (Signed)
Per Dr. Valentina Lucks, I faxed patient's last chart note to Dr. Delora Fuel.

## 2014-02-02 NOTE — Telephone Encounter (Signed)
I saw her on the 17th.  She referred me to Dr. Hermina Barters office.  I need the correct number for Dr. Delora Fuel.  I left her a message to dial [phone].

## 2014-02-06 ENCOUNTER — Other Ambulatory Visit: Payer: Self-pay

## 2014-02-06 ENCOUNTER — Telehealth: Payer: Self-pay | Admitting: *Deleted

## 2014-02-06 NOTE — Telephone Encounter (Signed)
She probably needs a referral from her primary care doc if she is medicaid-- not from [place]

## 2014-02-06 NOTE — Telephone Encounter (Signed)
Spoke with pt, she states she has an appt with Dr. Delora Fuel.

## 2014-02-09 ENCOUNTER — Ambulatory Visit
Admission: RE | Admit: 2014-02-09 | Discharge: 2014-02-09 | Disposition: A | Discharge status: Home / Self Care | Payer: PRIVATE HEALTH INSURANCE | Source: Ambulatory Visit | Attending: Nurse Practitioner | Admitting: Nurse Practitioner

## 2014-02-09 DIAGNOSIS — C220 Liver cell carcinoma: Secondary | ICD-10-CM

## 2014-03-11 ENCOUNTER — Encounter (HOSPITAL_COMMUNITY): Payer: Self-pay | Admitting: Emergency Medicine

## 2014-03-11 ENCOUNTER — Emergency Department (HOSPITAL_COMMUNITY): Payer: PRIVATE HEALTH INSURANCE

## 2014-03-11 ENCOUNTER — Observation Stay (HOSPITAL_COMMUNITY)
Admission: EM | Admit: 2014-03-11 | Discharge: 2014-03-13 | Disposition: A | Discharge status: Home / Self Care | Payer: PRIVATE HEALTH INSURANCE | Attending: General Surgery | Admitting: General Surgery

## 2014-03-11 DIAGNOSIS — F17200 Nicotine dependence, unspecified, uncomplicated: Secondary | ICD-10-CM | POA: Insufficient documentation

## 2014-03-11 DIAGNOSIS — K37 Unspecified appendicitis: Secondary | ICD-10-CM | POA: Diagnosis present

## 2014-03-11 DIAGNOSIS — F319 Bipolar disorder, unspecified: Secondary | ICD-10-CM | POA: Insufficient documentation

## 2014-03-11 DIAGNOSIS — K3580 Unspecified acute appendicitis: Principal | ICD-10-CM | POA: Insufficient documentation

## 2014-03-11 HISTORY — DX: Anxiety disorder, unspecified: F41.9

## 2014-03-11 LAB — CBC WITH DIFFERENTIAL/PLATELET
Basophils Absolute: 0 10*3/uL (ref 0.0–0.1)
Basophils Relative: 0 % (ref 0–1)
EOS PCT: 1 % (ref 0–5)
Eosinophils Absolute: 0.1 10*3/uL (ref 0.0–0.7)
HCT: 40 % (ref 36.0–46.0)
Hemoglobin: 14.5 g/dL (ref 12.0–15.0)
Lymphocytes Relative: 27 % (ref 12–46)
Lymphs Abs: 2.5 10*3/uL (ref 0.7–4.0)
MCH: 31.8 pg (ref 26.0–34.0)
MCHC: 36.3 g/dL — AB (ref 30.0–36.0)
MCV: 87.7 fL (ref 78.0–100.0)
MONO ABS: 0.8 10*3/uL (ref 0.1–1.0)
Monocytes Relative: 9 % (ref 3–12)
NEUTROS PCT: 63 % (ref 43–77)
Neutro Abs: 5.9 10*3/uL (ref 1.7–7.7)
PLATELETS: 174 10*3/uL (ref 150–400)
RBC: 4.56 MIL/uL (ref 3.87–5.11)
RDW: 14.1 % (ref 11.5–15.5)
WBC: 9.3 10*3/uL (ref 4.0–10.5)

## 2014-03-11 LAB — COMPREHENSIVE METABOLIC PANEL
ALT: 26 U/L (ref 0–35)
AST: 33 U/L (ref 0–37)
Albumin: 3.8 g/dL (ref 3.5–5.2)
Alkaline Phosphatase: 66 U/L (ref 39–117)
BILIRUBIN TOTAL: 0.9 mg/dL (ref 0.3–1.2)
BUN: 12 mg/dL (ref 6–23)
CALCIUM: 9.3 mg/dL (ref 8.4–10.5)
CHLORIDE: 101 meq/L (ref 96–112)
CO2: 23 meq/L (ref 19–32)
CREATININE: 0.65 mg/dL (ref 0.50–1.10)
GLUCOSE: 102 mg/dL — AB (ref 70–99)
Potassium: 3.9 mEq/L (ref 3.7–5.3)
Sodium: 138 mEq/L (ref 137–147)
Total Protein: 8.7 g/dL — ABNORMAL HIGH (ref 6.0–8.3)

## 2014-03-11 LAB — URINALYSIS, ROUTINE W REFLEX MICROSCOPIC
Glucose, UA: NEGATIVE mg/dL
HGB URINE DIPSTICK: NEGATIVE
KETONES UR: 15 mg/dL — AB
Nitrite: NEGATIVE
PROTEIN: NEGATIVE mg/dL
Specific Gravity, Urine: 1.024 (ref 1.005–1.030)
UROBILINOGEN UA: 1 mg/dL (ref 0.0–1.0)
pH: 6.5 (ref 5.0–8.0)

## 2014-03-11 LAB — URINE MICROSCOPIC-ADD ON

## 2014-03-11 LAB — LIPASE, BLOOD: Lipase: 14 U/L (ref 11–59)

## 2014-03-11 MED ORDER — IOHEXOL 300 MG/ML  SOLN
25.0000 mL | Freq: Once | INTRAMUSCULAR | Status: AC | PRN
  Administered 2014-03-11: 25 mL via ORAL

## 2014-03-11 MED ORDER — IOHEXOL 300 MG/ML  SOLN
80.0000 mL | Freq: Once | INTRAMUSCULAR | Status: AC | PRN
  Administered 2014-03-11: 80 mL via INTRAVENOUS

## 2014-03-11 MED ORDER — OXYCODONE-ACETAMINOPHEN 5-325 MG PO TABS
1.0000 | ORAL_TABLET | Freq: Once | ORAL | Status: AC
  Administered 2014-03-11: 1 via ORAL
  Filled 2014-03-11: qty 1

## 2014-03-11 MED ORDER — ONDANSETRON HCL 4 MG/2ML IJ SOLN
4.0000 mg | Freq: Once | INTRAMUSCULAR | Status: AC
  Administered 2014-03-11: 4 mg via INTRAVENOUS
  Filled 2014-03-11: qty 2

## 2014-03-11 MED ORDER — MORPHINE SULFATE 4 MG/ML IJ SOLN
4.0000 mg | Freq: Once | INTRAMUSCULAR | Status: AC
  Administered 2014-03-11: 4 mg via INTRAVENOUS
  Filled 2014-03-11: qty 1

## 2014-03-11 NOTE — ED Notes (Signed)
Pt offered pain medicine to help make her more comfortable while she waits. Pt agreeable and sts she will stay. Phlebotomy attempting to get blood work at this time.

## 2014-03-11 NOTE — ED Provider Notes (Signed)
CSN: [id number]     Arrival date & time 03/11/14  1800 History   First MD Initiated Contact with Patient 03/11/14 2039     Chief Complaint  Patient presents with  . Flank Pain     (Consider location/radiation/quality/duration/timing/severity/associated sxs/prior Treatment) Patient is a 58 y.o. female presenting with abdominal pain. The history is provided by the patient. No language interpreter was used.  Abdominal Pain Pain location:  RLQ Pain quality: aching and sharp   Pain radiates to:  Does not radiate Pain severity:  Severe Onset quality:  Sudden Duration:  22 hours Timing:  Constant Progression:  Worsening Chronicity:  New Relieved by:  Nothing Exacerbated by: bumps in the road, deep breathing. Ineffective treatments:  None tried Associated symptoms: anorexia, chills, fever and flatus   Associated symptoms: no chest pain, no constipation, no cough, no diarrhea, no dysuria, no fatigue, no nausea, no shortness of breath, no sore throat and no vomiting   Fever:    Timing:  Sporadic   Temp source:  Subjective Risk factors: no alcohol abuse, no aspirin use, not elderly, has not had multiple surgeries, no NSAID use, not obese, not pregnant and no recent hospitalization     Past Medical History  Diagnosis Date  . Bipolar 1 disorder   . Depression    Past Surgical History  Procedure Laterality Date  . Elevation of depressed skull fracture    . Abdominal hysterectomy    . Foot arthrodesis      rt and lt  . Fracture surgery  2005    fx both lower legs   . Tooth extraction  11/27/2011    Procedure: DENTAL RESTORATION/EXTRACTIONS;  Surgeon: [name], DDS;  Location: [facility];  Service: Oral Surgery;  Laterality: N/A;  just extractions   Family History  Problem Relation Age of Onset  . Hyperlipidemia Son    History  Substance Use Topics  . Smoking status: Current Every Day Smoker -- 0.50 packs/day for 39 years    Types: Cigarettes  . Smokeless  tobacco: Not on file  . Alcohol Use: 2.4 oz/week    4 Cans of beer per week     Comment: quit 18 mth ago   OB History   Grav Para Term Preterm Abortions TAB SAB Ect Mult Living                 Review of Systems  Constitutional: Positive for fever and chills. Negative for diaphoresis, activity change, appetite change and fatigue.  HENT: Negative for congestion, facial swelling, rhinorrhea and sore throat.   Eyes: Negative for photophobia and discharge.  Respiratory: Negative for cough, chest tightness and shortness of breath.   Cardiovascular: Negative for chest pain, palpitations and leg swelling.  Gastrointestinal: Positive for abdominal pain, anorexia and flatus. Negative for nausea, vomiting, diarrhea and constipation.  Endocrine: Negative for polydipsia and polyuria.  Genitourinary: Positive for flank pain. Negative for dysuria, frequency, difficulty urinating and pelvic pain.  Musculoskeletal: Negative for arthralgias, back pain, neck pain and neck stiffness.  Skin: Negative for color change and wound.  Allergic/Immunologic: Negative for immunocompromised state.  Neurological: Negative for facial asymmetry, weakness, numbness and headaches.  Hematological: Does not bruise/bleed easily.  Psychiatric/Behavioral: Negative for confusion and agitation.      Allergies  Demerol; Latex; Penicillins; and Strawberry  Home Medications   Prior to Admission medications   Medication Sig Start Date End Date Taking? Authorizing Provider  citalopram (CELEXA) 10 MG tablet Take 10  mg by mouth daily.  09/17/13  Yes Historical Provider, MD   BP 103/82  Pulse 78  Temp(Src) 98.4 F (36.9 C) (Oral)  Resp 20  Ht 5\' 8"  (1.727 m)  Wt 205 lb (92.987 kg)  BMI 31.18 kg/m2  SpO2 97%  LMP 03/19/1985 Physical Exam  Constitutional: She is oriented to person, place, and time. She appears well-developed and well-nourished. No distress.  HENT:  Head: Normocephalic and atraumatic.  Mouth/Throat: No  oropharyngeal exudate.  Eyes: Pupils are equal, round, and reactive to light.  Neck: Normal range of motion. Neck supple.  Cardiovascular: Normal rate, regular rhythm and normal heart sounds.  Exam reveals no gallop and no friction rub.   No murmur heard. Pulmonary/Chest: Effort normal and breath sounds normal. No respiratory distress. She has no wheezes. She has no rales.  Abdominal: Soft. Bowel sounds are normal. She exhibits no distension and no mass. There is tenderness in the right lower quadrant. There is no rigidity, no rebound and no guarding.  Musculoskeletal: Normal range of motion. She exhibits no edema and no tenderness.  Neurological: She is alert and oriented to person, place, and time.  Skin: Skin is warm and dry.  Psychiatric: She has a normal mood and affect.    ED Course  Procedures (including critical care time) Labs Review Labs Reviewed  CBC WITH DIFFERENTIAL - Abnormal; Notable for the following:    MCHC 36.3 (*)    All other components within normal limits  COMPREHENSIVE METABOLIC PANEL - Abnormal; Notable for the following:    Glucose, Bld 102 (*)    Total Protein 8.7 (*)    All other components within normal limits  URINALYSIS, ROUTINE W REFLEX MICROSCOPIC - Abnormal; Notable for the following:    Color, Urine AMBER (*)    Bilirubin Urine SMALL (*)    Ketones, ur 15 (*)    Leukocytes, UA SMALL (*)    All other components within normal limits  LIPASE, BLOOD  URINE MICROSCOPIC-ADD ON    Imaging Review Ct Abdomen Pelvis W Contrast  03/12/2014   CLINICAL DATA:  Right flank pain.  EXAM: CT ABDOMEN AND PELVIS WITH CONTRAST  TECHNIQUE: Multidetector CT imaging of the abdomen and pelvis was performed using the standard protocol following bolus administration of intravenous contrast.  CONTRAST:  22mL OMNIPAQUE IOHEXOL 300 MG/ML  SOLN  COMPARISON:  CT abdomen pelvis from 12/25/2008, and right upper quadrant abdominal ultrasound performed 02/09/2014  FINDINGS: Minimal  bibasilar atelectasis is noted.  There is minimal prominence of the intrahepatic biliary ducts, and the common hepatic duct measures up to 1.3 cm in diameter, of uncertain significance. The pancreatic duct is also distended, measuring 5-6 mm in diameter. There is no definite evidence of distal obstruction. The gallbladder is within normal limits. The pancreas and adrenal glands are unremarkable.  The kidneys are unremarkable in appearance. Contrast within the renal calyces limits evaluation for renal stones. There is no evidence of hydronephrosis. No ureteral stones are seen. No perinephric stranding is appreciated.  The small bowel is unremarkable in appearance. The stomach is within normal limits. No acute vascular abnormalities are seen.  The appendix is dilated to 1.2 cm in maximal diameter, increased wall enhancement and a small amount of associated free fluid. Mild surrounding soft tissue inflammation is seen. There is no definite evidence of perforation or abscess formation at this time. No significant pericecal lymphadenopathy is seen.  The colon is unremarkable in appearance.  The bladder is mildly distended and grossly  unremarkable in appearance. The patient is status post hysterectomy. The ovaries are relatively symmetric. No suspicious adnexal masses are seen. Trace free fluid within the pelvis may be physiologic in nature, or could reflect the appendiceal process. No inguinal lymphadenopathy is seen.  No acute osseous abnormalities are identified.  IMPRESSION: 1. Acute appendicitis noted, with dilatation of the appendix to 1.2 cm in maximal diameter, increased wall enhancement and a small amount of free fluid. Mild surrounding soft tissue inflammation seen. No definite evidence of perforation or abscess formation at this time. 2. Minimal prominence of the intrahepatic biliary ducts, and dilatation of the common hepatic duct and pancreatic duct. This raises concern for distal obstruction, though no  definite stone is seen. Would consider MRCP for further evaluation, when and as deemed clinically appropriate.  These results were called by telephone at the time of interpretation on 03/12/2014 at 12:40 AM to Dr. Ernestina Patches, who verbally acknowledged these results.   Electronically Signed   By: Garald Balding M.D.   On: 03/12/2014 00:43     EKG Interpretation None      MDM   Final diagnoses:  Appendicitis    Pt is a 58 y.o. female with Pmhx as above who presents with 22 hrs of RLQ pain, anorexia, mild subjective fever/chills. No n/v, d/a, urinary symptoms. On pe, pt has low grade temp. +RLQ pain w/ rebound or guarding. -Rovsing's sign, + obturator sign. Given concern for appendicitis, CT ab/pelvis ordered and was positive. No rupture or abscess was seen. Gen surg consulted for admission.         Neta Ehlers, MD 03/12/14 0110

## 2014-03-11 NOTE — ED Notes (Signed)
Pt sts she is leaving "i aint waiting here forever, I am in pain!"

## 2014-03-11 NOTE — ED Notes (Signed)
Pt completed drinking contrast.

## 2014-03-11 NOTE — ED Notes (Signed)
Pt reports right sided flank pain after eating beans. States that he took tums without much relief, denies any n/v. States that the pain is stabbing an burning in nature. Denies any urinary symptoms.

## 2014-03-11 NOTE — ED Notes (Signed)
CT notified that pt is finished with contrast.  

## 2014-03-12 ENCOUNTER — Encounter (HOSPITAL_COMMUNITY)
Admission: EM | Disposition: A | Discharge status: Home / Self Care | Payer: Self-pay | Source: Home / Self Care | Attending: Emergency Medicine

## 2014-03-12 ENCOUNTER — Observation Stay (HOSPITAL_COMMUNITY): Payer: PRIVATE HEALTH INSURANCE | Admitting: Certified Registered"

## 2014-03-12 ENCOUNTER — Encounter (HOSPITAL_COMMUNITY): Payer: PRIVATE HEALTH INSURANCE | Admitting: Certified Registered"

## 2014-03-12 ENCOUNTER — Encounter (HOSPITAL_COMMUNITY): Payer: Self-pay | Admitting: *Deleted

## 2014-03-12 DIAGNOSIS — K37 Unspecified appendicitis: Secondary | ICD-10-CM | POA: Diagnosis present

## 2014-03-12 DIAGNOSIS — K3580 Unspecified acute appendicitis: Secondary | ICD-10-CM

## 2014-03-12 HISTORY — PX: LAPAROSCOPIC APPENDECTOMY: SHX408

## 2014-03-12 LAB — SURGICAL PCR SCREEN
MRSA, PCR: NEGATIVE
Staphylococcus aureus: POSITIVE — AB

## 2014-03-12 SURGERY — APPENDECTOMY, LAPAROSCOPIC
Anesthesia: General | Site: Abdomen

## 2014-03-12 MED ORDER — MUPIROCIN 2 % EX OINT
1.0000 "application " | TOPICAL_OINTMENT | Freq: Two times a day (BID) | CUTANEOUS | Status: DC
  Administered 2014-03-12 (×2): 1 via NASAL
  Filled 2014-03-12: qty 22

## 2014-03-12 MED ORDER — ONDANSETRON HCL 4 MG/2ML IJ SOLN
4.0000 mg | Freq: Once | INTRAMUSCULAR | Status: DC | PRN
Start: 2014-03-12 — End: 2014-03-12

## 2014-03-12 MED ORDER — ONDANSETRON HCL 4 MG/2ML IJ SOLN
INTRAMUSCULAR | Status: DC | PRN
  Administered 2014-03-12: 4 mg via INTRAVENOUS

## 2014-03-12 MED ORDER — MIDAZOLAM HCL 5 MG/5ML IJ SOLN
INTRAMUSCULAR | Status: DC | PRN
  Administered 2014-03-12: 2 mg via INTRAVENOUS

## 2014-03-12 MED ORDER — FENTANYL CITRATE 0.05 MG/ML IJ SOLN
INTRAMUSCULAR | Status: AC
  Filled 2014-03-12: qty 5

## 2014-03-12 MED ORDER — PHENYLEPHRINE HCL 10 MG/ML IJ SOLN
INTRAMUSCULAR | Status: DC | PRN
  Administered 2014-03-12 (×2): 80 ug via INTRAVENOUS

## 2014-03-12 MED ORDER — HYDROMORPHONE HCL PF 1 MG/ML IJ SOLN
1.0000 mg | INTRAMUSCULAR | Status: DC | PRN
  Administered 2014-03-12: 1 mg via INTRAVENOUS
  Filled 2014-03-12: qty 1

## 2014-03-12 MED ORDER — HYDROMORPHONE HCL PF 1 MG/ML IJ SOLN
INTRAMUSCULAR | Status: AC
  Filled 2014-03-12: qty 1

## 2014-03-12 MED ORDER — CHLORHEXIDINE GLUCONATE CLOTH 2 % EX PADS: 6.0000 | MEDICATED_PAD | Freq: Every day | CUTANEOUS | Status: DC

## 2014-03-12 MED ORDER — HYDROMORPHONE HCL PF 1 MG/ML IJ SOLN
1.0000 mg | INTRAMUSCULAR | Status: DC | PRN
  Administered 2014-03-12 – 2014-03-13 (×2): 1 mg via INTRAVENOUS
  Filled 2014-03-12 (×2): qty 1

## 2014-03-12 MED ORDER — LIDOCAINE HCL (CARDIAC) 20 MG/ML IV SOLN
INTRAVENOUS | Status: DC | PRN
  Administered 2014-03-12: 40 mg via INTRAVENOUS

## 2014-03-12 MED ORDER — PROPOFOL 10 MG/ML IV BOLUS
INTRAVENOUS | Status: DC | PRN
  Administered 2014-03-12: 120 mg via INTRAVENOUS

## 2014-03-12 MED ORDER — OXYCODONE-ACETAMINOPHEN 5-325 MG PO TABS
1.0000 | ORAL_TABLET | ORAL | Status: DC | PRN
  Administered 2014-03-12 – 2014-03-13 (×3): 2 via ORAL
  Filled 2014-03-12 (×3): qty 2

## 2014-03-12 MED ORDER — NEOSTIGMINE METHYLSULFATE 10 MG/10ML IV SOLN
INTRAVENOUS | Status: AC
  Filled 2014-03-12: qty 1

## 2014-03-12 MED ORDER — ACETAMINOPHEN 650 MG RE SUPP: 650.0000 mg | Freq: Four times a day (QID) | RECTAL | Status: DC | PRN

## 2014-03-12 MED ORDER — MIDAZOLAM HCL 2 MG/2ML IJ SOLN
INTRAMUSCULAR | Status: AC
  Filled 2014-03-12: qty 2

## 2014-03-12 MED ORDER — SODIUM CHLORIDE 0.9 % IR SOLN
Status: DC | PRN
  Administered 2014-03-12: 1

## 2014-03-12 MED ORDER — HYDROMORPHONE HCL PF 1 MG/ML IJ SOLN
0.2500 mg | INTRAMUSCULAR | Status: DC | PRN
  Administered 2014-03-12: 0.5 mg via INTRAVENOUS

## 2014-03-12 MED ORDER — SODIUM CHLORIDE 0.9 % IV SOLN
INTRAVENOUS | Status: DC
  Administered 2014-03-12 – 2014-03-13 (×2): via INTRAVENOUS

## 2014-03-12 MED ORDER — ROCURONIUM BROMIDE 100 MG/10ML IV SOLN
INTRAVENOUS | Status: DC | PRN
  Administered 2014-03-12: 25 mg via INTRAVENOUS

## 2014-03-12 MED ORDER — 0.9 % SODIUM CHLORIDE (POUR BTL) OPTIME
TOPICAL | Status: DC | PRN
  Administered 2014-03-12: 1000 mL

## 2014-03-12 MED ORDER — BUPIVACAINE-EPINEPHRINE 0.25% -1:200000 IJ SOLN
INTRAMUSCULAR | Status: DC | PRN
  Administered 2014-03-12: 20 mL

## 2014-03-12 MED ORDER — CLINDAMYCIN PHOSPHATE 600 MG/50ML IV SOLN
600.0000 mg | Freq: Once | INTRAVENOUS | Status: AC
  Administered 2014-03-12: 600 mg via INTRAVENOUS
  Filled 2014-03-12: qty 50

## 2014-03-12 MED ORDER — AZTREONAM 2 G IJ SOLR
2.0000 g | Freq: Once | INTRAMUSCULAR | Status: AC
  Administered 2014-03-12: 2 g via INTRAVENOUS
  Filled 2014-03-12: qty 2

## 2014-03-12 MED ORDER — ONDANSETRON HCL 4 MG/2ML IJ SOLN
INTRAMUSCULAR | Status: AC
  Filled 2014-03-12: qty 2

## 2014-03-12 MED ORDER — FENTANYL CITRATE 0.05 MG/ML IJ SOLN
INTRAMUSCULAR | Status: DC | PRN
  Administered 2014-03-12: 50 ug via INTRAVENOUS
  Administered 2014-03-12: 100 ug via INTRAVENOUS
  Administered 2014-03-12 (×2): 50 ug via INTRAVENOUS

## 2014-03-12 MED ORDER — HEPARIN SODIUM (PORCINE) 5000 UNIT/ML IJ SOLN: 5000.0000 [IU] | Freq: Three times a day (TID) | INTRAMUSCULAR | Status: DC

## 2014-03-12 MED ORDER — MORPHINE SULFATE 4 MG/ML IJ SOLN
4.0000 mg | Freq: Once | INTRAMUSCULAR | Status: AC
Start: 2014-03-12 — End: 2014-03-12
  Administered 2014-03-12: 4 mg via INTRAVENOUS
  Filled 2014-03-12: qty 1

## 2014-03-12 MED ORDER — LIDOCAINE HCL (CARDIAC) 20 MG/ML IV SOLN
INTRAVENOUS | Status: AC
  Filled 2014-03-12: qty 5

## 2014-03-12 MED ORDER — LACTATED RINGERS IV SOLN
INTRAVENOUS | Status: DC
  Administered 2014-03-12: 50 mL/h via INTRAVENOUS

## 2014-03-12 MED ORDER — ONDANSETRON HCL 4 MG/2ML IJ SOLN: 4.0000 mg | Freq: Four times a day (QID) | INTRAMUSCULAR | Status: DC | PRN

## 2014-03-12 MED ORDER — NEOSTIGMINE METHYLSULFATE 10 MG/10ML IV SOLN
INTRAVENOUS | Status: DC | PRN
  Administered 2014-03-12: 3 mg via INTRAVENOUS

## 2014-03-12 MED ORDER — ROCURONIUM BROMIDE 50 MG/5ML IV SOLN
INTRAVENOUS | Status: AC
  Filled 2014-03-12: qty 1

## 2014-03-12 MED ORDER — GLYCOPYRROLATE 0.2 MG/ML IJ SOLN
INTRAMUSCULAR | Status: AC
  Filled 2014-03-12: qty 2

## 2014-03-12 MED ORDER — SUCCINYLCHOLINE CHLORIDE 20 MG/ML IJ SOLN
INTRAMUSCULAR | Status: DC | PRN
  Administered 2014-03-12: 100 mg via INTRAVENOUS

## 2014-03-12 MED ORDER — BUPIVACAINE-EPINEPHRINE (PF) 0.25% -1:200000 IJ SOLN
INTRAMUSCULAR | Status: AC
  Filled 2014-03-12: qty 30

## 2014-03-12 MED ORDER — ACETAMINOPHEN 325 MG PO TABS: 650.0000 mg | ORAL_TABLET | Freq: Four times a day (QID) | ORAL | Status: DC | PRN

## 2014-03-12 MED ORDER — GLYCOPYRROLATE 0.2 MG/ML IJ SOLN
INTRAMUSCULAR | Status: DC | PRN
  Administered 2014-03-12: 0.4 mg via INTRAVENOUS

## 2014-03-12 MED ORDER — PROPOFOL 10 MG/ML IV BOLUS
INTRAVENOUS | Status: AC
  Filled 2014-03-12: qty 20

## 2014-03-12 MED ORDER — CIPROFLOXACIN IN D5W 400 MG/200ML IV SOLN
400.0000 mg | Freq: Once | INTRAVENOUS | Status: DC
  Administered 2014-03-12: 400 mg via INTRAVENOUS
  Filled 2014-03-12: qty 200

## 2014-03-12 MED ORDER — CITALOPRAM HYDROBROMIDE 10 MG PO TABS
10.0000 mg | ORAL_TABLET | Freq: Every day | ORAL | Status: DC
  Administered 2014-03-12: 10 mg via ORAL
  Filled 2014-03-12 (×2): qty 1

## 2014-03-12 MED ORDER — HEPARIN SODIUM (PORCINE) 5000 UNIT/ML IJ SOLN
5000.0000 [IU] | Freq: Three times a day (TID) | INTRAMUSCULAR | Status: DC
  Administered 2014-03-13: 5000 [IU] via SUBCUTANEOUS
  Filled 2014-03-12 (×5): qty 1

## 2014-03-12 SURGICAL SUPPLY — 55 items
ADH SKN CLS APL DERMABOND .7 (GAUZE/BANDAGES/DRESSINGS) ×1
APPLIER CLIP ROT 10 11.4 M/L (STAPLE)
APR CLP MED LRG 11.4X10 (STAPLE)
BAG SPEC RTRVL LRG 6X4 10 (ENDOMECHANICALS) ×1
BLADE SURG ROTATE 9660 (MISCELLANEOUS) IMPLANT
CANISTER SUCTION 2500CC (MISCELLANEOUS) ×3 IMPLANT
CATH FOLEY LATEX FREE 16FR (CATHETERS) ×3
CATH FOLEY LF 16FR (CATHETERS) IMPLANT
CHLORAPREP W/TINT 26ML (MISCELLANEOUS) ×3 IMPLANT
CLIP APPLIE ROT 10 11.4 M/L (STAPLE) IMPLANT
COVER SURGICAL LIGHT HANDLE (MISCELLANEOUS) ×3 IMPLANT
CUTTER LINEAR ENDO 35 ETS (STAPLE) ×2 IMPLANT
CUTTER LINEAR ENDO 35 ETS TH (STAPLE) IMPLANT
DECANTER SPIKE VIAL GLASS SM (MISCELLANEOUS) ×1 IMPLANT
DERMABOND ADVANCED (GAUZE/BANDAGES/DRESSINGS) ×2
DERMABOND ADVANCED .7 DNX12 (GAUZE/BANDAGES/DRESSINGS) ×1 IMPLANT
DRAPE UTILITY 15X26 W/TAPE STR (DRAPE) ×6 IMPLANT
DRSG TEGADERM 2-3/8X2-3/4 SM (GAUZE/BANDAGES/DRESSINGS) ×6 IMPLANT
ELECT REM PT RETURN 9FT ADLT (ELECTROSURGICAL) ×3
ELECTRODE REM PT RTRN 9FT ADLT (ELECTROSURGICAL) ×1 IMPLANT
ENDOLOOP SUT PDS II  0 18 (SUTURE)
ENDOLOOP SUT PDS II 0 18 (SUTURE) IMPLANT
GLOVE BIOGEL PI IND STRL 6.5 (GLOVE) IMPLANT
GLOVE BIOGEL PI IND STRL 7.0 (GLOVE) IMPLANT
GLOVE BIOGEL PI IND STRL 7.5 (GLOVE) IMPLANT
GLOVE BIOGEL PI IND STRL 8 (GLOVE) ×1 IMPLANT
GLOVE BIOGEL PI INDICATOR 6.5 (GLOVE) ×4
GLOVE BIOGEL PI INDICATOR 7.0 (GLOVE) ×2
GLOVE BIOGEL PI INDICATOR 7.5 (GLOVE) ×4
GLOVE BIOGEL PI INDICATOR 8 (GLOVE) ×2
GLOVE ECLIPSE 7.5 STRL STRAW (GLOVE) ×1 IMPLANT
GLOVE SURG SS PI 6.5 STRL IVOR (GLOVE) ×8 IMPLANT
GLOVE SURG SS PI 7.5 STRL IVOR (GLOVE) ×6 IMPLANT
GOWN STRL REUS W/ TWL LRG LVL3 (GOWN DISPOSABLE) ×2 IMPLANT
GOWN STRL REUS W/TWL LRG LVL3 (GOWN DISPOSABLE) ×6
KIT BASIN OR (CUSTOM PROCEDURE TRAY) ×3 IMPLANT
KIT ROOM TURNOVER OR (KITS) ×3 IMPLANT
NS IRRIG 1000ML POUR BTL (IV SOLUTION) ×3 IMPLANT
PAD ARMBOARD 7.5X6 YLW CONV (MISCELLANEOUS) ×6 IMPLANT
PENCIL BUTTON HOLSTER BLD 10FT (ELECTRODE) IMPLANT
POUCH SPECIMEN RETRIEVAL 10MM (ENDOMECHANICALS) ×3 IMPLANT
RELOAD /EVU35 (ENDOMECHANICALS) IMPLANT
RELOAD CUTTER ETS 35MM STAND (ENDOMECHANICALS) IMPLANT
SCALPEL HARMONIC ACE (MISCELLANEOUS) ×2 IMPLANT
SET IRRIG TUBING LAPAROSCOPIC (IRRIGATION / IRRIGATOR) ×3 IMPLANT
SPECIMEN JAR SMALL (MISCELLANEOUS) ×3 IMPLANT
SUT MNCRL AB 4-0 PS2 18 (SUTURE) ×3 IMPLANT
TOWEL OR 17X24 6PK STRL BLUE (TOWEL DISPOSABLE) ×3 IMPLANT
TOWEL OR 17X26 10 PK STRL BLUE (TOWEL DISPOSABLE) ×3 IMPLANT
TRAY FOLEY CATH 16FR SILVER (SET/KITS/TRAYS/PACK) ×1 IMPLANT
TRAY LAPAROSCOPIC (CUSTOM PROCEDURE TRAY) ×3 IMPLANT
TROCAR XCEL 12X100 BLDLESS (ENDOMECHANICALS) ×3 IMPLANT
TROCAR XCEL BLUNT TIP 100MML (ENDOMECHANICALS) ×3 IMPLANT
TROCAR XCEL NON-BLD 5MMX100MML (ENDOMECHANICALS) ×3 IMPLANT
WATER STERILE IRR 1000ML POUR (IV SOLUTION) IMPLANT

## 2014-03-12 NOTE — Anesthesia Procedure Notes (Signed)
Procedure Name: Intubation Date/Time: 03/12/2014 11:15 AM Performed by: Melina Copa, Breeanna Galgano R Pre-anesthesia Checklist: Patient identified, Emergency Drugs available, Suction available, Patient being monitored and Timeout performed Patient Re-evaluated:Patient Re-evaluated prior to inductionOxygen Delivery Method: Circle system utilized Preoxygenation: Pre-oxygenation with 100% oxygen Intubation Type: IV induction and Cricoid Pressure applied Ventilation: Mask ventilation without difficulty Laryngoscope Size: Mac and 3 Grade View: Grade II Tube type: Oral Tube size: 7.0 mm Number of attempts: 1 Airway Equipment and Method: Stylet Placement Confirmation: ETT inserted through vocal cords under direct vision,  positive ETCO2 and breath sounds checked- equal and bilateral Secured at: 21 cm Tube secured with: Tape Dental Injury: Teeth and Oropharynx as per pre-operative assessment

## 2014-03-12 NOTE — ED Notes (Signed)
Pt back from CT.  pt alert, NAD, calm, interactive, resps e/u, no dyspnea noted, speaking in clear complete sentences, VSS, "feel better, still hurting".

## 2014-03-12 NOTE — Interval H&P Note (Signed)
History and Physical Interval Note: Patient with acute appendicitis, for surgery soon.  Does not appear to be ruptured.   03/12/2014 10:49 AM  Katrina Kelley  has presented today for surgery, with the diagnosis of acute appendicitis  The various methods of treatment have been discussed with the patient and family. After consideration of risks, benefits and other options for treatment, the patient has consented to  Procedure(s): APPENDECTOMY LAPAROSCOPIC (N/A) as a surgical intervention .  The patient's history has been reviewed, patient examined, no change in status, stable for surgery.  I have reviewed the patient's chart and labs.  Questions were answered to the patient's satisfaction.     [name]

## 2014-03-12 NOTE — Progress Notes (Signed)
Pt. Admitted to unit via ED.  On arrival to unit pt. Reports that she has a rash and raised bumps/ weeps areas noted to her anterior and posterior right forearm.  She reports that it is redden since start of medication and is itching as well as her head is itching.   Medication stopped when pt. Got on unit.   I have called Dr. Donne Hazel and informed him of above information.  He reports to stop medication and he will order a new medication for the pt.  I have collected pt's values and send to values give to Security.

## 2014-03-12 NOTE — H&P (Signed)
Katrina Kelley is an 58 y.o. female.   Chief Complaint: referred by dr Jinny Blossom docherty for abdominal pain and ct with appendicitis HPI: 32 yof with no prior history colonoscopy who presents with just over 24 hours of rlq pain.  This has been unrelenting and has worsened leading her to come to er.  Has anorexia, some nausea, no emesis, nothing was making it better.  Has been passing flatus and having bms.  She thinks she might have had a low grade fever. She came in due to continued pain.   Past Medical History  Diagnosis Date  . Bipolar 1 disorder   . Depression     Past Surgical History  Procedure Laterality Date  . Elevation of depressed skull fracture    . Abdominal hysterectomy    . Foot arthrodesis      rt and lt  . Fracture surgery  2005    fx both lower legs   . Tooth extraction  11/27/2011    Procedure: DENTAL RESTORATION/EXTRACTIONS;  Surgeon: [name], DDS;  Location: [facility];  Service: Oral Surgery;  Laterality: N/A;  just extractions    Family History  Problem Relation Age of Onset  . Hyperlipidemia Son    Social History:  reports that she has been smoking Cigarettes.  She has a 19.5 pack-year smoking history. She does not have any smokeless tobacco history on file. She reports that she drinks about 2.4 ounces of alcohol per week. She reports that she does not use illicit drugs.  Allergies:  Allergies  Allergen Reactions  . Demerol Swelling    Extremity swelling after iv administration  . Latex Hives and Itching  . Penicillins Nausea And Vomiting and Swelling  . Strawberry Swelling    Meds celexa  Results for orders placed during the hospital encounter of 03/11/14 (from the past 48 hour(s))  CBC WITH DIFFERENTIAL     Status: Abnormal   Collection Time    03/11/14  6:46 PM      Result Value Ref Range   WBC 9.3  4.0 - 10.5 K/uL   RBC 4.56  3.87 - 5.11 MIL/uL   Hemoglobin 14.5  12.0 - 15.0 g/dL   HCT 40.0  36.0 - 46.0 %   MCV 87.7   78.0 - 100.0 fL   MCH 31.8  26.0 - 34.0 pg   MCHC 36.3 (*) 30.0 - 36.0 g/dL   RDW 14.1  11.5 - 15.5 %   Platelets 174  150 - 400 K/uL   Neutrophils Relative % 63  43 - 77 %   Neutro Abs 5.9  1.7 - 7.7 K/uL   Lymphocytes Relative 27  12 - 46 %   Lymphs Abs 2.5  0.7 - 4.0 K/uL   Monocytes Relative 9  3 - 12 %   Monocytes Absolute 0.8  0.1 - 1.0 K/uL   Eosinophils Relative 1  0 - 5 %   Eosinophils Absolute 0.1  0.0 - 0.7 K/uL   Basophils Relative 0  0 - 1 %   Basophils Absolute 0.0  0.0 - 0.1 K/uL  COMPREHENSIVE METABOLIC PANEL     Status: Abnormal   Collection Time    03/11/14  6:46 PM      Result Value Ref Range   Sodium 138  137 - 147 mEq/L   Potassium 3.9  3.7 - 5.3 mEq/L   Chloride 101  96 - 112 mEq/L   CO2 23  19 - 32  mEq/L   Glucose, Bld 102 (*) 70 - 99 mg/dL   BUN 12  6 - 23 mg/dL   Creatinine, Ser 0.65  0.50 - 1.10 mg/dL   Calcium 9.3  8.4 - 10.5 mg/dL   Total Protein 8.7 (*) 6.0 - 8.3 g/dL   Albumin 3.8  3.5 - 5.2 g/dL   AST 33  0 - 37 U/L   ALT 26  0 - 35 U/L   Alkaline Phosphatase 66  39 - 117 U/L   Total Bilirubin 0.9  0.3 - 1.2 mg/dL   GFR calc non Af Amer >90  >90 mL/min   GFR calc Af Amer >90  >90 mL/min   Comment: (NOTE)     The eGFR has been calculated using the CKD EPI equation.     This calculation has not been validated in all clinical situations.     eGFR's persistently <90 mL/min signify possible Chronic Kidney     Disease.  LIPASE, BLOOD     Status: None   Collection Time    03/11/14  6:46 PM      Result Value Ref Range   Lipase 14  11 - 59 U/L  URINALYSIS, ROUTINE W REFLEX MICROSCOPIC     Status: Abnormal   Collection Time    03/11/14  6:59 PM      Result Value Ref Range   Color, Urine AMBER (*) YELLOW   Comment: BIOCHEMICALS MAY BE AFFECTED BY COLOR   APPearance CLEAR  CLEAR   Specific Gravity, Urine 1.024  1.005 - 1.030   pH 6.5  5.0 - 8.0   Glucose, UA NEGATIVE  NEGATIVE mg/dL   Hgb urine dipstick NEGATIVE  NEGATIVE   Bilirubin Urine  SMALL (*) NEGATIVE   Ketones, ur 15 (*) NEGATIVE mg/dL   Protein, ur NEGATIVE  NEGATIVE mg/dL   Urobilinogen, UA 1.0  0.0 - 1.0 mg/dL   Nitrite NEGATIVE  NEGATIVE   Leukocytes, UA SMALL (*) NEGATIVE  URINE MICROSCOPIC-ADD ON     Status: None   Collection Time    03/11/14  6:59 PM      Result Value Ref Range   Squamous Epithelial / LPF RARE  RARE   WBC, UA 7-10  <3 WBC/hpf   RBC / HPF 0-2  <3 RBC/hpf   Bacteria, UA RARE  RARE   Ct Abdomen Pelvis W Contrast  03/12/2014   CLINICAL DATA:  Right flank pain.  EXAM: CT ABDOMEN AND PELVIS WITH CONTRAST  TECHNIQUE: Multidetector CT imaging of the abdomen and pelvis was performed using the standard protocol following bolus administration of intravenous contrast.  CONTRAST:  31m OMNIPAQUE IOHEXOL 300 MG/ML  SOLN  COMPARISON:  CT abdomen pelvis from 12/25/2008, and right upper quadrant abdominal ultrasound performed 02/09/2014  FINDINGS: Minimal bibasilar atelectasis is noted.  There is minimal prominence of the intrahepatic biliary ducts, and the common hepatic duct measures up to 1.3 cm in diameter, of uncertain significance. The pancreatic duct is also distended, measuring 5-6 mm in diameter. There is no definite evidence of distal obstruction. The gallbladder is within normal limits. The pancreas and adrenal glands are unremarkable.  The kidneys are unremarkable in appearance. Contrast within the renal calyces limits evaluation for renal stones. There is no evidence of hydronephrosis. No ureteral stones are seen. No perinephric stranding is appreciated.  The small bowel is unremarkable in appearance. The stomach is within normal limits. No acute vascular abnormalities are seen.  The appendix is dilated to 1.2 cm  in maximal diameter, increased wall enhancement and a small amount of associated free fluid. Mild surrounding soft tissue inflammation is seen. There is no definite evidence of perforation or abscess formation at this time. No significant pericecal  lymphadenopathy is seen.  The colon is unremarkable in appearance.  The bladder is mildly distended and grossly unremarkable in appearance. The patient is status post hysterectomy. The ovaries are relatively symmetric. No suspicious adnexal masses are seen. Trace free fluid within the pelvis may be physiologic in nature, or could reflect the appendiceal process. No inguinal lymphadenopathy is seen.  No acute osseous abnormalities are identified.  IMPRESSION: 1. Acute appendicitis noted, with dilatation of the appendix to 1.2 cm in maximal diameter, increased wall enhancement and a small amount of free fluid. Mild surrounding soft tissue inflammation seen. No definite evidence of perforation or abscess formation at this time. 2. Minimal prominence of the intrahepatic biliary ducts, and dilatation of the common hepatic duct and pancreatic duct. This raises concern for distal obstruction, though no definite stone is seen. Would consider MRCP for further evaluation, when and as deemed clinically appropriate.  These results were called by telephone at the time of interpretation on 03/12/2014 at 12:40 AM to Dr. Ernestina Patches, who verbally acknowledged these results.   Electronically Signed   By: Garald Balding M.D.   On: 03/12/2014 00:43    Review of Systems  Constitutional: Positive for fever. Negative for chills and malaise/fatigue.  Respiratory: Negative for shortness of breath.   Cardiovascular: Negative for chest pain.  Gastrointestinal: Positive for nausea, abdominal pain and constipation. Negative for vomiting.  Genitourinary: Negative for dysuria, urgency and frequency.    Blood pressure 103/85, pulse 85, temperature 98.4 F (36.9 C), temperature source Oral, resp. rate 20, height 5' 8"  (1.727 m), weight 205 lb (92.987 kg), last menstrual period 03/19/1985, SpO2 95.00%. Physical Exam  Vitals reviewed. Constitutional: She is oriented to person, place, and time. She appears well-developed and  well-nourished.  Eyes: No scleral icterus.  Neck: Neck supple.  Cardiovascular: Normal rate, regular rhythm and normal heart sounds.   Respiratory: Effort normal and breath sounds normal. She has no wheezes. She has no rales.  GI: Soft. Bowel sounds are normal. She exhibits no distension. There is tenderness in the right lower quadrant. There is tenderness at McBurney's point. No hernia.    Lymphadenopathy:    She has no cervical adenopathy.  Neurological: She is alert and oriented to person, place, and time.     Assessment/Plan Appendicitis  She appears to have nonperforated appendicitis and is not ill appearing with nl wbc.  Will plan on admission, npo, abx in er are written for, laparoscopic appendectomy in am with Dr Hulen Skains.  We discussed procedure and likely hospital stay postop. She understands that if perforated will be here longer.    Rolm Bookbinder 03/12/2014, 1:30 AM

## 2014-03-12 NOTE — Anesthesia Postprocedure Evaluation (Signed)
  Anesthesia Post-op Note  Patient: Katrina Kelley  Procedure(s) Performed: Procedure(s): APPENDECTOMY LAPAROSCOPIC (N/A)  Patient Location: PACU  Anesthesia Type:General  Level of Consciousness: awake, alert  and oriented  Airway and Oxygen Therapy: Patient Spontanous Breathing and Patient connected to nasal cannula oxygen  Post-op Pain: mild  Post-op Assessment: Post-op Vital signs reviewed, Patient's Cardiovascular Status Stable, Respiratory Function Stable, Patent Airway and Pain level controlled  Post-op Vital Signs: stable  Last Vitals:  Filed Vitals:   03/12/14 1331  BP: 123/80  Pulse: 83  Temp: 36.7 C  Resp: 20    Complications: No apparent anesthesia complications

## 2014-03-12 NOTE — Op Note (Signed)
OPERATIVE REPORT  DATE OF OPERATION: 03/11/2014 - 03/12/2014  PATIENT:  Katrina Kelley  58 y.o. female  PRE-OPERATIVE DIAGNOSIS:  ACUTE APPENDICITIS  POST-OPERATIVE DIAGNOSIS:  ACUTE APPENDICITIS  PROCEDURE:  Procedure(s): APPENDECTOMY LAPAROSCOPIC  SURGEON:  Surgeon(s): [name], MD  ASSISTANT: None  ANESTHESIA:   general  EBL: 20 ml  BLOOD ADMINISTERED: none  DRAINS: none   SPECIMEN:  Source of Specimen:  Appendix  COUNTS CORRECT:  YES  PROCEDURE DETAILS: The patient was taken to the operating room and placed on the table in supine position. After an adequate general endotracheal anesthetic was administered she was prepped and draped in usual sterile manner exposing her abdomen.  A proper timeout was performed identifying the patient and the procedure to be performed. A supraumbilical midline incision was made using a #15 blade and taken down to the midline fascia. We incised midline fascia using a 15 blade and bluntly dissected down into the perineal cavity with Coker clamps on the fascial edges. Once we were in the perineal cavity a pursestring suture of 0 Vicryl was passed from the fascial opening was secured in a Hassan cannula. Carbon dioxide gas was insufflated through the Hassan cannula into the perineal cavity up to a maximal intra-abdominal pressure of 15 mm of mercury.  Once the abdomen was distended a right upper quadrant 5 mm cannula passed under direct vision. From a previous lower midline incision from previous C-section the patient had omental adhesions to the intra-abdominal wall. These adhesions were taken down using a harmonic scalpel.  Adhesions were taken down we were able to place a left low quadrant 12 mm cannula under direct vision. With all cannulas in place a dissection was begun.  The acutely inflamed and distended appendix was noted in the right low quadrant. UA with a dissected from the terminal ileum and surrounding structures using the  harmonic scalpel and blunt dissection. We're able to isolate and take down the measle appendix using the harmonic scalpel. This left [place] with the base of the appendix at the cecum across which we came with a [id number]  Endoscopic GIA. Once this was done the appendix was completely detached and able to be retrieved from the left low quadrant cannula site using an Endo Catch bag.  We irrigated the right low quadrant area with saline and found there to be no bleeding. The staple line appeared to be secured. We aspirate all fluid and gas from the abdomen as we level of the patient on the table.  The supraumbilical fascial site was closed using the Pershing suture which was in place. We aspirated all fluid and gas from above the liver and around the abdominal cavity.  Once all cannulas were removed quartered percent Marcaine with epinephrine was injected all size. The skin was closed using running subcuticular stitch of 4-0 Monocryl. Dermabond Steri-Strips and a Tegaderm used to complete the dressings.  All needle counts, sponge counts, and instrument counts were correct.  PATIENT DISPOSITION:  PACU - hemodynamically stable.   [name] 5/28/201512:18 PM

## 2014-03-12 NOTE — Progress Notes (Signed)
UR completed 

## 2014-03-12 NOTE — ED Notes (Signed)
Pt up to b/r. Steady gait, alert, NAD, calm, interactive.

## 2014-03-12 NOTE — Progress Notes (Signed)
Noted that lower extrems. With old scars noted bilaterally.  Pt. Reports that right foot has a plate.  She states that she has foot surgery on her foot.  Noted that area where plate is the area is raised and darken.  No open wound areas noted.

## 2014-03-12 NOTE — ED Notes (Signed)
Dr. Donne Hazel (surg) at [facility].

## 2014-03-12 NOTE — Progress Notes (Signed)
Care of pt assumed by MA Jullia Mulligan RN 

## 2014-03-12 NOTE — Transfer of Care (Signed)
Immediate Anesthesia Transfer of Care Note  Patient: Katrina Kelley  Procedure(s) Performed: Procedure(s): APPENDECTOMY LAPAROSCOPIC (N/A)  Patient Location: PACU  Anesthesia Type:General  Level of Consciousness: awake, alert  and oriented  Airway & Oxygen Therapy: Patient Spontanous Breathing and Patient connected to nasal cannula oxygen  Post-op Assessment: Report given to PACU RN, Post -op Vital signs reviewed and stable and Patient moving all extremities  Post vital signs: Reviewed and stable  Complications: No apparent anesthesia complications

## 2014-03-12 NOTE — Anesthesia Preprocedure Evaluation (Addendum)
Anesthesia Evaluation  Patient identified by MRN, date of birth, ID band Patient awake    Reviewed: Allergy & Precautions, H&P , NPO status , Patient's Chart, lab work & pertinent test results  Airway Mallampati: II TM Distance: >3 FB Neck ROM: Full    Dental  (+) Poor Dentition, Dental Advisory Given   Pulmonary Current Smoker,  breath sounds clear to auscultation        Cardiovascular Rhythm:Regular Rate:Normal     Neuro/Psych    GI/Hepatic   Endo/Other    Renal/GU      Musculoskeletal   Abdominal   Peds  Hematology   Anesthesia Other Findings   Reproductive/Obstetrics                         Anesthesia Physical Anesthesia Plan  ASA: III  Anesthesia Plan: General   Post-op Pain Management:    Induction: Intravenous  Airway Management Planned: Oral ETT  Additional Equipment:   Intra-op Plan:   Post-operative Plan:   Informed Consent: I have reviewed the patients History and Physical, chart, labs and discussed the procedure including the risks, benefits and alternatives for the proposed anesthesia with the patient or authorized representative who has indicated his/her understanding and acceptance.   Dental advisory given  Plan Discussed with: CRNA, Anesthesiologist and Surgeon  Anesthesia Plan Comments:        Anesthesia Quick Evaluation

## 2014-03-12 NOTE — Progress Notes (Signed)
Patient requesting for belongings to be returned to her from safe.  Encouraged not to keep them in room during hospital stay.  Insisted upon getting belongings back.  All belongings returned to patient's room.

## 2014-03-13 MED ORDER — OXYCODONE-ACETAMINOPHEN 5-325 MG PO TABS: 1.0000 | ORAL_TABLET | ORAL | Status: DC | PRN

## 2014-03-13 MED ORDER — HYDROCODONE-ACETAMINOPHEN 5-325 MG PO TABS: 1.0000 | ORAL_TABLET | Freq: Four times a day (QID) | ORAL | Status: DC | PRN

## 2014-03-13 NOTE — Progress Notes (Signed)
Discharge home. 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 Discharge instruction given to patient, verbalizes understanding.

## 2014-03-13 NOTE — Discharge Summary (Signed)
  Patient ID: PRESTON WEILL [id number] 58 y.o. 1956/06/07  Admit date: 03/11/2014  Discharge date and time: No discharge date for patient encounter.  Admitting Physician: Judeth Horn  Discharge Physician: Adin Hector  Admission Diagnoses: Appendicitis [541]  Discharge Diagnoses: same  Operations: Procedure(s): APPENDECTOMY LAPAROSCOPIC  Admission Condition: fair  Discharged Condition: good  Indication for Admission: 58 year old female presents with 24 hours a right lower quadrant pain, unrelenting. Anorexia and nausea. Having bowel movements. No diarrhea.Exam reveals right lower quadrant localized tenderness. CT scan consistent with appendicitis.  Hospital Course: She was admitted, started on IV fluids, analgesics, and antibiotics. She was taken the operating room by Dr. Hulen Skains and underwent laparoscopic appendectomy. She was observed overnight and has done very well. She has resumed diet, voiding without difficulty, ambulating independently. She feels ready to go home. Abdominal exam reveals all trocar sites looked good. The abdomen is soft and minimally tender. She was given a prescription for Norco for pain. Diet and activities were discussed. Follow up appointment was recommended.  Consults: None  Significant Diagnostic Studies: CT scan. Surgical pathology pending.  Treatments: surgery: Laparoscopic appendectomy  Disposition: Home  Patient Instructions:    Medication List         citalopram 10 MG tablet  Commonly known as:  CELEXA  Take 10 mg by mouth daily.     HYDROcodone-acetaminophen 5-325 MG per tablet  Commonly known as:  NORCO  Take 1-2 tablets by mouth every 6 (six) hours as needed.        Activity: activity as tolerated Diet: low fat, low cholesterol diet Wound Care: none needed  Follow-up:  With [facility] in 3 weeks.  Signed: Edsel Petrin. Dalbert Batman, M.D., FACS General and minimally invasive surgery Breast and Colorectal  Surgery  03/13/2014, 9:25 AM

## 2014-03-13 NOTE — Discharge Instructions (Signed)
See above

## 2014-03-16 ENCOUNTER — Encounter (HOSPITAL_COMMUNITY): Payer: Self-pay | Admitting: General Surgery

## 2014-04-21 ENCOUNTER — Encounter (INDEPENDENT_AMBULATORY_CARE_PROVIDER_SITE_OTHER): Payer: PRIVATE HEALTH INSURANCE | Admitting: General Surgery

## 2014-04-23 ENCOUNTER — Encounter (INDEPENDENT_AMBULATORY_CARE_PROVIDER_SITE_OTHER): Payer: Self-pay | Admitting: General Surgery

## 2014-04-29 ENCOUNTER — Other Ambulatory Visit: Payer: Self-pay | Admitting: Internal Medicine

## 2014-04-29 DIAGNOSIS — Z1231 Encounter for screening mammogram for malignant neoplasm of breast: Secondary | ICD-10-CM

## 2014-06-05 ENCOUNTER — Ambulatory Visit (INDEPENDENT_AMBULATORY_CARE_PROVIDER_SITE_OTHER): Payer: Medicare Other | Admitting: Podiatrist

## 2014-06-05 ENCOUNTER — Encounter: Payer: Self-pay | Admitting: Podiatrist

## 2014-06-05 VITALS — BP 86/56 | HR 68 | Resp 18

## 2014-06-05 DIAGNOSIS — M19071 Primary osteoarthritis, right ankle and foot: Secondary | ICD-10-CM

## 2014-06-05 DIAGNOSIS — M19079 Primary osteoarthritis, unspecified ankle and foot: Secondary | ICD-10-CM

## 2014-06-05 MED ORDER — OXYCODONE-ACETAMINOPHEN 10-325 MG PO TABS: 1.0000 | ORAL_TABLET | Freq: Four times a day (QID) | ORAL | Status: DC | PRN

## 2014-06-05 MED ORDER — OXYCODONE-ACETAMINOPHEN 5-325 MG PO TABS: 1.0000 | ORAL_TABLET | ORAL | Status: DC | PRN

## 2014-06-05 NOTE — Patient Instructions (Signed)
Pre-Operative Instructions  Congratulations, you have decided to take an important step to improving your quality of life.  You can be assured that the doctors of [facility] will be with you every step of the way.  1. Plan to be at the surgery center/hospital at least 1 (one) hour prior to your scheduled time unless otherwise directed by the surgical center/hospital staff.  You must have a responsible adult accompany you, remain during the surgery and drive you home.  Make sure you have directions to the surgical center/hospital and know how to get there on time. 2. For hospital based surgery you will need to obtain a history and physical form from your family physician within 1 month prior to the date of surgery- we will give you a form for you primary physician.  3. We make every effort to accommodate the date you request for surgery.  There are however, times where surgery dates or times have to be moved.  We will contact you as soon as possible if a change in schedule is required.   4. No Aspirin/Ibuprofen for one week before surgery.  If you are on aspirin, any non-steroidal anti-inflammatory medications (Mobic, Aleve, Ibuprofen) you should stop taking it 7 days prior to your surgery.  You make take Tylenol  For pain prior to surgery.  5. Medications- If you are taking daily heart and blood pressure medications, seizure, reflux, allergy, asthma, anxiety, pain or diabetes medications, make sure the surgery center/hospital is aware before the day of surgery so they may notify you which medications to take or avoid the day of surgery. 6. No food or drink after midnight the night before surgery unless directed otherwise by surgical center/hospital staff. 7. No alcoholic beverages 24 hours prior to surgery.  No smoking 24 hours prior to or 24 hours after surgery. 8. Wear loose pants or shorts- loose enough to fit over bandages, boots, and casts. 9. No slip on shoes, sneakers are best. 10. Bring  your boot with you to the surgery center/hospital.  Also bring crutches or a walker if your physician has prescribed it for you.  If you do not have this equipment, it will be provided for you after surgery. 11. If you have not been contracted by the surgery center/hospital by the day before your surgery, call to confirm the date and time of your surgery. 12. Leave-time from work may vary depending on the type of surgery you have.  Appropriate arrangements should be made prior to surgery with your employer. 13. Prescriptions will be provided immediately following surgery by your doctor.  Have these filled as soon as possible after surgery and take the medication as directed. 14. Remove nail polish on the operative foot. 15. Wash the night before surgery.  The night before surgery wash the foot and leg well with the antibacterial soap provided and water paying special attention to beneath the toenails and in between the toes.  Rinse thoroughly with water and dry well with a towel.  Perform this wash unless told not to do so by your physician.  Enclosed: 1 Ice pack (please put in freezer the night before surgery)   1 Hibiclens skin cleaner   Pre-op Instructions  If you have any questions regarding the instructions, do not hesitate to call our office.  [place]: [address] [place], [place] [address] [phone]  [place]: [address] , [place] [address] [phone]  [place]: [address]  [place], [place] [address] [phone]  Dr. Richard   Tuchman DPM, Dr. Norman Regal DPM Dr. Richard Sikora DPM, Dr. M. Todd Hyatt DPM, Dr. Iley Deignan DPM 

## 2014-06-11 NOTE — Progress Notes (Signed)
Subjective: Katrina Kelley presents today for surgical consult regarding continued right foot pain. She relates she never followed up with Dr. dial and she would like me to do her surgery. She states that she still lives in [facility] [place] however she can have someone help her after her surgery and getting her to her postoperative appointments. She spent a lot of time in [place] visiting her mom and she states she still she can't walk on her right foot any longer and she would like it fixed. She had a cotton plantarflexory osteotomy performed on 10/27/2011 with bone grafting. 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   Objective: Neurovascular status intact with palpable pedal pulses and neurological status intact to the right foot. The patient complains of pain at the medial cuneiform region both dorsally and plantarly. She states the foot swells and has really never gone down status post the cotton osteotomy surgery.   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 Risks of the surgery were discussed including but not limited to continued pain, infection, swelling, decreased range of motion, suture or implant reaction, bleeding, decreased function, etc. Preoperative instructions were also dispensed to the patient as well as a preoperative surgical pamphlet to go along with the instructions. Surgery will be scheduled at the patients convenience and patient will  be seen at [facility] center on outpatient basis.The patient is instructed to call if any questions or concerns arise.

## 2014-06-14 ENCOUNTER — Emergency Department (HOSPITAL_COMMUNITY)
Admission: EM | Admit: 2014-06-14 | Discharge: 2014-06-14 | Disposition: A | Discharge status: Home / Self Care | Payer: PRIVATE HEALTH INSURANCE | Attending: Emergency Medicine | Admitting: Emergency Medicine

## 2014-06-14 ENCOUNTER — Encounter (HOSPITAL_COMMUNITY): Payer: Self-pay | Admitting: Emergency Medicine

## 2014-06-14 ENCOUNTER — Emergency Department (HOSPITAL_COMMUNITY): Payer: PRIVATE HEALTH INSURANCE

## 2014-06-14 DIAGNOSIS — Z79899 Other long term (current) drug therapy: Secondary | ICD-10-CM | POA: Diagnosis not present

## 2014-06-14 DIAGNOSIS — S6990XA Unspecified injury of unspecified wrist, hand and finger(s), initial encounter: Secondary | ICD-10-CM

## 2014-06-14 DIAGNOSIS — IMO0002 Reserved for concepts with insufficient information to code with codable children: Secondary | ICD-10-CM | POA: Diagnosis not present

## 2014-06-14 DIAGNOSIS — F319 Bipolar disorder, unspecified: Secondary | ICD-10-CM | POA: Diagnosis not present

## 2014-06-14 DIAGNOSIS — Z880 Allergy status to penicillin: Secondary | ICD-10-CM | POA: Insufficient documentation

## 2014-06-14 DIAGNOSIS — F411 Generalized anxiety disorder: Secondary | ICD-10-CM | POA: Insufficient documentation

## 2014-06-14 DIAGNOSIS — S50811A Abrasion of right forearm, initial encounter: Secondary | ICD-10-CM

## 2014-06-14 DIAGNOSIS — S59919A Unspecified injury of unspecified forearm, initial encounter: Secondary | ICD-10-CM

## 2014-06-14 DIAGNOSIS — F17200 Nicotine dependence, unspecified, uncomplicated: Secondary | ICD-10-CM | POA: Diagnosis not present

## 2014-06-14 DIAGNOSIS — Z91040 Latex allergy status: Secondary | ICD-10-CM | POA: Insufficient documentation

## 2014-06-14 DIAGNOSIS — Z792 Long term (current) use of antibiotics: Secondary | ICD-10-CM | POA: Diagnosis not present

## 2014-06-14 DIAGNOSIS — Y9289 Other specified places as the place of occurrence of the external cause: Secondary | ICD-10-CM | POA: Insufficient documentation

## 2014-06-14 DIAGNOSIS — Z23 Encounter for immunization: Secondary | ICD-10-CM | POA: Diagnosis not present

## 2014-06-14 DIAGNOSIS — S59909A Unspecified injury of unspecified elbow, initial encounter: Secondary | ICD-10-CM | POA: Insufficient documentation

## 2014-06-14 DIAGNOSIS — Y9389 Activity, other specified: Secondary | ICD-10-CM | POA: Diagnosis not present

## 2014-06-14 DIAGNOSIS — L089 Local infection of the skin and subcutaneous tissue, unspecified: Secondary | ICD-10-CM

## 2014-06-14 MED ORDER — OXYCODONE-ACETAMINOPHEN 5-325 MG PO TABS
2.0000 | ORAL_TABLET | Freq: Once | ORAL | Status: AC
  Administered 2014-06-14: 2 via ORAL
  Filled 2014-06-14: qty 2

## 2014-06-14 MED ORDER — IBUPROFEN 400 MG PO TABS
600.0000 mg | ORAL_TABLET | Freq: Once | ORAL | Status: AC
  Administered 2014-06-14: 600 mg via ORAL
  Filled 2014-06-14 (×2): qty 1

## 2014-06-14 MED ORDER — OXYCODONE-ACETAMINOPHEN 5-325 MG PO TABS
1.0000 | ORAL_TABLET | Freq: Once | ORAL | Status: AC
  Administered 2014-06-14: 1 via ORAL
  Filled 2014-06-14: qty 1

## 2014-06-14 MED ORDER — OXYCODONE-ACETAMINOPHEN 5-325 MG PO TABS: 1.0000 | ORAL_TABLET | ORAL | Status: DC | PRN

## 2014-06-14 MED ORDER — CLINDAMYCIN HCL 150 MG PO CAPS
300.0000 mg | ORAL_CAPSULE | Freq: Three times a day (TID) | ORAL | Status: DC
Start: 2014-06-14 — End: 2014-12-18

## 2014-06-14 MED ORDER — TETANUS-DIPHTH-ACELL PERTUSSIS 5-2.5-18.5 LF-MCG/0.5 IM SUSP
0.5000 mL | Freq: Once | INTRAMUSCULAR | Status: AC
  Administered 2014-06-14: 0.5 mL via INTRAMUSCULAR
  Filled 2014-06-14: qty 0.5

## 2014-06-14 MED ORDER — CLINDAMYCIN HCL 300 MG PO CAPS
450.0000 mg | ORAL_CAPSULE | Freq: Once | ORAL | Status: AC
  Administered 2014-06-14: 450 mg via ORAL
  Filled 2014-06-14: qty 1

## 2014-06-14 NOTE — ED Notes (Signed)
Per pt sts a piece of a fence stuck in her right wrist area yesterday and since redness and swelling. Area warm to touch .

## 2014-06-18 NOTE — ED Provider Notes (Signed)
CSN: [id number]     Arrival date & time 06/14/14  1813 History   First MD Initiated Contact with Patient 06/14/14 1929     Chief Complaint  Patient presents with  . Arm Pain     (Consider location/radiation/quality/duration/timing/severity/associated sxs/prior Treatment) HPI  58 year old female with pain and swelling to her right forearm. Patient reports that she was hopping over a fence when she sustained an abrasion on some barbed wire to the volar aspect of her distal right wrist. Today she has  increased pain and redness. No drainage. No numbness or tingling. No fevers or chills. Unsure of her tetanus status. No history of diabetes.  Past Medical History  Diagnosis Date  . Bipolar 1 disorder   . Depression   . Anxiety    Past Surgical History  Procedure Laterality Date  . Elevation of depressed skull fracture    . Abdominal hysterectomy    . Foot arthrodesis      rt and lt  . Fracture surgery  2005    fx both lower legs   . Tooth extraction  11/27/2011    Procedure: DENTAL RESTORATION/EXTRACTIONS;  Surgeon: [name], DDS;  Location: [facility];  Service: Oral Surgery;  Laterality: N/A;  just extractions  . Laparoscopic appendectomy N/A 03/12/2014    Procedure: APPENDECTOMY LAPAROSCOPIC;  Surgeon: [name], MD;  Location: [facility];  Service: General;  Laterality: N/A;   Family History  Problem Relation Age of Onset  . Hyperlipidemia Son    History  Substance Use Topics  . Smoking status: Current Every Day Smoker -- 0.25 packs/day for 38 years    Types: Cigarettes  . Smokeless tobacco: Not on file  . Alcohol Use: No   OB History   Grav Para Term Preterm Abortions TAB SAB Ect Mult Living                 Review of Systems  All systems reviewed and negative, other than as noted in HPI.   Allergies  Demerol; Latex; Penicillins; and Strawberry  Home Medications   Prior to Admission medications   Medication Sig Start Date End Date Taking?  Authorizing Provider  citalopram (CELEXA) 10 MG tablet Take 10 mg by mouth daily.  09/17/13  Yes Historical Provider, MD  clindamycin (CLEOCIN) 150 MG capsule Take 2 capsules (300 mg total) by mouth 3 (three) times daily. 06/14/14   Virgel Manifold, MD  oxyCODONE-acetaminophen (PERCOCET/ROXICET) 5-325 MG per tablet Take 1 tablet by mouth every 4 (four) hours as needed for severe pain. 06/14/14   Virgel Manifold, MD   BP 128/88  Pulse 81  Temp(Src) 98.9 F (37.2 C) (Oral)  Resp 14  SpO2 95%  LMP 03/19/1985 Physical Exam  Nursing note and vitals reviewed. Constitutional: She appears well-developed and well-nourished. No distress.  HENT:  Head: Normocephalic and atraumatic.  Eyes: Conjunctivae are normal. Right eye exhibits no discharge. Left eye exhibits no discharge.  Neck: Neck supple.  Cardiovascular: Normal rate, regular rhythm and normal heart sounds.  Exam reveals no gallop and no friction rub.   No murmur heard. Pulmonary/Chest: Effort normal and breath sounds normal. No respiratory distress.  Abdominal: Soft. She exhibits no distension. There is no tenderness.  Musculoskeletal: She exhibits no edema and no tenderness.  Neurological: She is alert.  Skin: Skin is warm and dry.  Tenderness to palpation, increased warmth and swelling of the distal forearm. Primarily along the volar surface. It does not extend past the flexor  crease of the wrist or quite up to the elbow. No apparent pain with both active and passive range of motion of both wrist and the elbow. No fluctuance. There are intact distally.  Psychiatric: She has a normal mood and affect. Her behavior is normal. Thought content normal.    ED Course  Procedures (including critical care time) Labs Review Labs Reviewed - No data to display  Imaging Review No results found.  Dg Forearm Right  06/14/2014   CLINICAL DATA:  Anterior distal forearm pain. Redness, swelling. Cut by rusty fence.  EXAM: RIGHT FOREARM - 2 VIEW   COMPARISON:  None.  FINDINGS: There is no evidence of fracture or other focal bone lesions. Soft tissues are unremarkable. No radiopaque foreign bodies.  IMPRESSION: Negative.   Electronically Signed   By: Rolm Baptise M.D.   On: 06/14/2014 19:02   Dg Wrist Complete Right  06/14/2014   CLINICAL DATA:  Cut wrist on rusty fence.  EXAM: RIGHT WRIST - COMPLETE 3+ VIEW  COMPARISON:  None.  FINDINGS: There is no evidence of fracture or dislocation. There is no evidence of arthropathy or other focal bone abnormality. Soft tissues are unremarkable. No radiopaque foreign body.  IMPRESSION: No acute findings.   Electronically Signed   By: Rolm Baptise M.D.   On: 06/14/2014 19:04    EKG Interpretation None      MDM   Final diagnoses:  Abrasion, forearm with infection, right, initial encounter    58 year old female with cellulitis of right forearm. Patient has no significant concern for foreign body. None noted on x-ray. He is nontoxic-appearing. Tetanus is updated. I feel she is appropriate for outpatient treatment at this time. Return precautions were discussed. Continued antibiotics and as needed pain medication.      Virgel Manifold, MD 06/18/14 [phone]

## 2014-06-19 ENCOUNTER — Telehealth: Payer: Self-pay | Admitting: *Deleted

## 2014-06-19 NOTE — Telephone Encounter (Signed)
I called and asked the patient if she had scheduled her surgery and signed consent forms.  She stated she has not scheduled the surgery yet.  She said that she is going to go on-line and register next week.  Will call with a date on Tuesday.  She said she has already signed the consent form and received a surgery packet.  I told her to call when she gets ready.

## 2014-06-25 ENCOUNTER — Emergency Department (HOSPITAL_COMMUNITY)
Admission: EM | Admit: 2014-06-25 | Discharge: 2014-06-25 | Disposition: A | Discharge status: Home / Self Care | Payer: PRIVATE HEALTH INSURANCE | Attending: Emergency Medicine | Admitting: Emergency Medicine

## 2014-06-25 ENCOUNTER — Telehealth: Payer: Self-pay | Admitting: *Deleted

## 2014-06-25 ENCOUNTER — Encounter (HOSPITAL_COMMUNITY): Payer: Self-pay | Admitting: Emergency Medicine

## 2014-06-25 DIAGNOSIS — F319 Bipolar disorder, unspecified: Secondary | ICD-10-CM | POA: Diagnosis not present

## 2014-06-25 DIAGNOSIS — Z880 Allergy status to penicillin: Secondary | ICD-10-CM | POA: Diagnosis not present

## 2014-06-25 DIAGNOSIS — F17200 Nicotine dependence, unspecified, uncomplicated: Secondary | ICD-10-CM | POA: Diagnosis not present

## 2014-06-25 DIAGNOSIS — Z792 Long term (current) use of antibiotics: Secondary | ICD-10-CM | POA: Insufficient documentation

## 2014-06-25 DIAGNOSIS — M6740 Ganglion, unspecified site: Secondary | ICD-10-CM | POA: Diagnosis not present

## 2014-06-25 DIAGNOSIS — Z79899 Other long term (current) drug therapy: Secondary | ICD-10-CM | POA: Diagnosis not present

## 2014-06-25 DIAGNOSIS — F411 Generalized anxiety disorder: Secondary | ICD-10-CM | POA: Diagnosis not present

## 2014-06-25 DIAGNOSIS — Z4800 Encounter for change or removal of nonsurgical wound dressing: Secondary | ICD-10-CM | POA: Diagnosis present

## 2014-06-25 DIAGNOSIS — Z91040 Latex allergy status: Secondary | ICD-10-CM | POA: Insufficient documentation

## 2014-06-25 MED ORDER — MELOXICAM 15 MG PO TABS: 15.0000 mg | ORAL_TABLET | Freq: Every day | ORAL | Status: DC

## 2014-06-25 MED ORDER — OXYCODONE-ACETAMINOPHEN 5-325 MG PO TABS: 1.0000 | ORAL_TABLET | Freq: Four times a day (QID) | ORAL | Status: DC | PRN

## 2014-06-25 NOTE — ED Provider Notes (Signed)
CSN: [id number]     Arrival date & time 06/25/14  0930 History   First MD Initiated Contact with Patient 06/25/14 0940     Chief Complaint  Patient presents with  . Wound Check     (Consider location/radiation/quality/duration/timing/severity/associated sxs/prior Treatment) HPI Katrina Kelley is a 58 y.o. female who presents with  Chief Complaint  Patient presents with  . Wound Check   with history of bipolar 1 disorder, depression and anxiety presenting to ED 10  days after tx for cellulitis of right wrist that occurred after she cut her wrist on a barbed wire fence.  Pt states she was prescribed clindamycin and given a tetanus shot at that time, 06/14/14.  States she was advised to return to ED for recheck of symptoms if they worsened or did not improve, which prompted pt to come to ED today. . States redness and swelling to right forearm has improved after completion of clindamycin, however, now she noticed a "knot" on the bottom of her wrist where initial injury took place. Pt states when she was seen 10 days ago, the "knot" was not there, just swelling and redness.  States the area is throbbing and times and sore, 3/10, but worse with full extension of her right hand.  Denies fever, n/v/d. Denies new injury to wrist.   Past Medical History  Diagnosis Date  . Bipolar 1 disorder   . Depression   . Anxiety    Past Surgical History  Procedure Laterality Date  . Elevation of depressed skull fracture    . Abdominal hysterectomy    . Foot arthrodesis      rt and lt  . Fracture surgery  2005    fx both lower legs   . Tooth extraction  11/27/2011    Procedure: DENTAL RESTORATION/EXTRACTIONS;  Surgeon: [name], DDS;  Location: [facility];  Service: Oral Surgery;  Laterality: N/A;  just extractions  . Laparoscopic appendectomy N/A 03/12/2014    Procedure: APPENDECTOMY LAPAROSCOPIC;  Surgeon: [name], MD;  Location: [facility];  Service: General;  Laterality: N/A;     Family History  Problem Relation Age of Onset  . Hyperlipidemia Son    History  Substance Use Topics  . Smoking status: Current Every Day Smoker -- 0.25 packs/day for 38 years    Types: Cigarettes  . Smokeless tobacco: Not on file  . Alcohol Use: No   OB History   Grav Para Term Preterm Abortions TAB SAB Ect Mult Living                 Review of Systems  Constitutional: Negative for fever and chills.  Musculoskeletal: Positive for arthralgias, joint swelling ( just proximal to bend in right wrist) and myalgias.  Skin: Negative for color change and wound.  All other systems reviewed and are negative.     Allergies  Demerol; Latex; Penicillins; and Strawberry  Home Medications   Prior to Admission medications   Medication Sig Start Date End Date Taking? Authorizing Provider  citalopram (CELEXA) 10 MG tablet Take 10 mg by mouth daily.  09/17/13   Historical Provider, MD  clindamycin (CLEOCIN) 150 MG capsule Take 2 capsules (300 mg total) by mouth 3 (three) times daily. 06/14/14   Virgel Manifold, MD  meloxicam (MOBIC) 15 MG tablet Take 1 tablet (15 mg total) by mouth daily. Take daily for 5 days, then as needed for pain. 06/25/14   Noland Fordyce, PA-C  oxyCODONE-acetaminophen (PERCOCET/ROXICET) [phone]  MG per tablet Take 1 tablet by mouth every 6 (six) hours as needed for severe pain. 06/25/14   Noland Fordyce, PA-C   BP 141/92  Pulse 75  Temp(Src) 98.6 F (37 C) (Oral)  Resp 20  SpO2 99%  LMP 03/19/1985 Physical Exam  Nursing note and vitals reviewed. Constitutional: She is oriented to person, place, and time. She appears well-developed and well-nourished.  HENT:  Head: Normocephalic and atraumatic.  Eyes: EOM are normal.  Neck: Normal range of motion.  Cardiovascular: Normal rate.   Pulses:      Radial pulses are 2+ on the right side.  Pulmonary/Chest: Effort normal.  Musculoskeletal: Normal range of motion. She exhibits edema and tenderness.       Arms: Right  wrist: FROM, increased pain to volar aspect of wrist with full wrist extension.  Full flexion w/o pain.  5/5 grip strength right hand.   Neurological: She is alert and oriented to person, place, and time.  Right hand: sensation to light and sharp touch in tact.   Skin: Skin is warm and dry.  Psychiatric: She has a normal mood and affect. Her behavior is normal.    ED Course  Procedures (including critical care time) Labs Review Labs Reviewed - No data to display  Imaging Review No results found.   EKG Interpretation None      MDM   Final diagnoses:  Ganglion cyst   Pt presenting to ED with right wrist pain and lesion consistent with ganglion cyst.  Previous medical records reviewed and description of infection by pt, infection appears to have resolved, however, cyst is new.  Will tx conservatively with pain medication, antinflamatories, and wrist splint. Home care instructions provided. Also advised pt to f/u with PCP as needed and provided contact info for Dr. Grandville Silos, hand surgery, for further evaluation and treatment of cyst if symptoms not improving.  Pt verbalized understanding and agreement with tx plan.    Noland Fordyce, PA-C 06/25/14 1016

## 2014-06-25 NOTE — Telephone Encounter (Signed)
I'm calling to see if I can get a personal account number from you to register on-line to pre-register for surgery.  I went on-line and couldn't go any further.  Maybe you can tell me more about it.  I returned her call.  She stated she called the surgical center and got registered.  I asked her what day did she have in mind.  She stated she was it done as soon as possible.  I offered her 07/06/2014.  She said that is fine.  She asked for the time.  I told her the surgical center would call her the Friday before with the exact time to be there.  She stated thanks for calling.

## 2014-06-25 NOTE — ED Notes (Signed)
Patient was seen here approx 1 week ago for wound to right wrist from metal fence, patient told to come back for follow up, patient completed all meds, states swelling in wound area not subsiding

## 2014-06-25 NOTE — Discharge Instructions (Signed)

## 2014-06-25 NOTE — ED Provider Notes (Signed)
Medical screening examination/treatment/procedure(s) were performed by non-physician practitioner and as supervising physician I was immediately available for consultation/collaboration.   EKG Interpretation None        Wandra Arthurs, MD 06/25/14 [phone]

## 2014-06-25 NOTE — ED Notes (Signed)
Patient in NAD at time of d/c

## 2014-07-15 ENCOUNTER — Encounter: Payer: Medicare Other | Admitting: Podiatrist

## 2014-08-03 ENCOUNTER — Encounter (HOSPITAL_COMMUNITY): Payer: Self-pay | Admitting: Emergency Medicine

## 2014-08-03 ENCOUNTER — Emergency Department (HOSPITAL_COMMUNITY)
Admission: EM | Admit: 2014-08-03 | Discharge: 2014-08-03 | Disposition: A | Discharge status: Home / Self Care | Payer: PRIVATE HEALTH INSURANCE | Attending: Emergency Medicine | Admitting: Emergency Medicine

## 2014-08-03 DIAGNOSIS — M79671 Pain in right foot: Secondary | ICD-10-CM | POA: Insufficient documentation

## 2014-08-03 DIAGNOSIS — Z720 Tobacco use: Secondary | ICD-10-CM | POA: Insufficient documentation

## 2014-08-03 DIAGNOSIS — F319 Bipolar disorder, unspecified: Secondary | ICD-10-CM | POA: Diagnosis not present

## 2014-08-03 DIAGNOSIS — F419 Anxiety disorder, unspecified: Secondary | ICD-10-CM | POA: Diagnosis not present

## 2014-08-03 DIAGNOSIS — Z91040 Latex allergy status: Secondary | ICD-10-CM | POA: Diagnosis not present

## 2014-08-03 DIAGNOSIS — B9689 Other specified bacterial agents as the cause of diseases classified elsewhere: Secondary | ICD-10-CM

## 2014-08-03 DIAGNOSIS — G8929 Other chronic pain: Secondary | ICD-10-CM | POA: Insufficient documentation

## 2014-08-03 DIAGNOSIS — Z880 Allergy status to penicillin: Secondary | ICD-10-CM | POA: Insufficient documentation

## 2014-08-03 DIAGNOSIS — Z79899 Other long term (current) drug therapy: Secondary | ICD-10-CM | POA: Diagnosis not present

## 2014-08-03 DIAGNOSIS — N760 Acute vaginitis: Secondary | ICD-10-CM | POA: Insufficient documentation

## 2014-08-03 DIAGNOSIS — Z3202 Encounter for pregnancy test, result negative: Secondary | ICD-10-CM | POA: Insufficient documentation

## 2014-08-03 DIAGNOSIS — N898 Other specified noninflammatory disorders of vagina: Secondary | ICD-10-CM | POA: Diagnosis present

## 2014-08-03 DIAGNOSIS — Z9089 Acquired absence of other organs: Secondary | ICD-10-CM | POA: Insufficient documentation

## 2014-08-03 DIAGNOSIS — Z792 Long term (current) use of antibiotics: Secondary | ICD-10-CM | POA: Diagnosis not present

## 2014-08-03 DIAGNOSIS — Z791 Long term (current) use of non-steroidal anti-inflammatories (NSAID): Secondary | ICD-10-CM | POA: Insufficient documentation

## 2014-08-03 DIAGNOSIS — Z90710 Acquired absence of both cervix and uterus: Secondary | ICD-10-CM | POA: Insufficient documentation

## 2014-08-03 LAB — URINALYSIS, ROUTINE W REFLEX MICROSCOPIC
Bilirubin Urine: NEGATIVE
GLUCOSE, UA: NEGATIVE mg/dL
HGB URINE DIPSTICK: NEGATIVE
Ketones, ur: NEGATIVE mg/dL
Leukocytes, UA: NEGATIVE
Nitrite: NEGATIVE
Protein, ur: 30 mg/dL — AB
SPECIFIC GRAVITY, URINE: 1.026 (ref 1.005–1.030)
Urobilinogen, UA: 0.2 mg/dL (ref 0.0–1.0)
pH: 8.5 — ABNORMAL HIGH (ref 5.0–8.0)

## 2014-08-03 LAB — WET PREP, GENITAL
TRICH WET PREP: NONE SEEN
Yeast Wet Prep HPF POC: NONE SEEN

## 2014-08-03 LAB — PREGNANCY, URINE: PREG TEST UR: NEGATIVE

## 2014-08-03 LAB — URINE MICROSCOPIC-ADD ON

## 2014-08-03 MED ORDER — METRONIDAZOLE 500 MG PO TABS: 500.0000 mg | ORAL_TABLET | Freq: Two times a day (BID) | ORAL | Status: DC

## 2014-08-03 MED ORDER — OXYCODONE-ACETAMINOPHEN 5-325 MG PO TABS
2.0000 | ORAL_TABLET | Freq: Once | ORAL | Status: AC
  Administered 2014-08-03: 2 via ORAL
  Filled 2014-08-03: qty 2

## 2014-08-03 MED ORDER — OXYCODONE-ACETAMINOPHEN 5-325 MG PO TABS: 1.0000 | ORAL_TABLET | ORAL | Status: DC | PRN

## 2014-08-03 NOTE — ED Notes (Signed)
Pt c/o right foot pain that is chronic in nature; pt c/o yellow vaginal discharge

## 2014-08-03 NOTE — ED Provider Notes (Signed)
  Medical screening examination/treatment/procedure(s) were performed by non-physician practitioner and as supervising physician I was immediately available for consultation/collaboration.    Carmin Muskrat, MD 08/03/14 [phone]

## 2014-08-03 NOTE — ED Provider Notes (Signed)
CSN: [id number]     Arrival date & time 08/03/14  0277 History   First MD Initiated Contact with Patient 08/03/14 1009     Chief Complaint  Patient presents with  . Foot Pain  . Vaginal Discharge     (Consider location/radiation/quality/duration/timing/severity/associated sxs/prior Treatment) HPI Comments: The patient is a 58 year old female past medical history significant for bipolar 1 disorder, depression, anxiety presenting to the emergency department for 2 complaints. She is complaining of her chronic right foot pain, states she is out of her pain medication has not been able to follow up with her podiatrist or her PCP regarding this issue. No new injuries or changes in her symptoms. No changes in quality of pain or numbness to her right great toe.   Patient's second complaint is 2 weeks of yellowish white vaginal discharge with foul odor. Patient states she has had a "bacterial infection" in the past similar to her doctor always needs to "give me a pill for." Denies any recent sexual intercourse. No abdominal pain, nausea, vomiting, diarrhea, urinary symptoms, vaginal bleeding. Patient not longer gets a menstrual cycle. Abdominal surgical history includes abdominal hysterectomy, appendectomy.  Patient is a 58 y.o. female presenting with lower extremity pain and vaginal discharge.  Foot Pain Associated symptoms include arthralgias and joint swelling.  Vaginal Discharge   Past Medical History  Diagnosis Date  . Bipolar 1 disorder   . Depression   . Anxiety    Past Surgical History  Procedure Laterality Date  . Elevation of depressed skull fracture    . Abdominal hysterectomy    . Foot arthrodesis      rt and lt  . Fracture surgery  2005    fx both lower legs   . Tooth extraction  11/27/2011    Procedure: DENTAL RESTORATION/EXTRACTIONS;  Surgeon: [name], DDS;  Location: [facility];  Service: Oral Surgery;  Laterality: N/A;  just extractions  .  Laparoscopic appendectomy N/A 03/12/2014    Procedure: APPENDECTOMY LAPAROSCOPIC;  Surgeon: [name], MD;  Location: [facility];  Service: General;  Laterality: N/A;   Family History  Problem Relation Age of Onset  . Hyperlipidemia Son    History  Substance Use Topics  . Smoking status: Current Every Day Smoker -- 0.25 packs/day for 38 years    Types: Cigarettes  . Smokeless tobacco: Not on file  . Alcohol Use: No   OB History   Grav Para Term Preterm Abortions TAB SAB Ect Mult Living                 Review of Systems  Genitourinary: Positive for vaginal discharge.  Musculoskeletal: Positive for arthralgias and joint swelling.  All other systems reviewed and are negative.     Allergies  Demerol; Latex; Penicillins; Strawberry; and Vicodin  Home Medications   Prior to Admission medications   Medication Sig Start Date End Date Taking? Authorizing Provider  citalopram (CELEXA) 10 MG tablet Take 10 mg by mouth daily.  09/17/13   Historical Provider, MD  clindamycin (CLEOCIN) 150 MG capsule Take 2 capsules (300 mg total) by mouth 3 (three) times daily. 06/14/14   Virgel Manifold, MD  meloxicam (MOBIC) 15 MG tablet Take 1 tablet (15 mg total) by mouth daily. Take daily for 5 days, then as needed for pain. 06/25/14   Noland Fordyce, PA-C  metroNIDAZOLE (FLAGYL) 500 MG tablet Take 1 tablet (500 mg total) by mouth 2 (two) times daily. 08/03/14   Stephani Police  Jasia Hiltunen, PA-C  oxyCODONE-acetaminophen (PERCOCET/ROXICET) 5-325 MG per tablet Take 1 tablet by mouth every 6 (six) hours as needed for severe pain. 06/25/14   Noland Fordyce, PA-C  oxyCODONE-acetaminophen (PERCOCET/ROXICET) 5-325 MG per tablet Take 1 tablet by mouth every 4 (four) hours as needed for severe pain. May take 2 tablets PO q 6 hours for severe pain - Do not take with Tylenol as this tablet already contains tylenol 08/03/14   Delsa Walder L Peaches Vanoverbeke, PA-C   BP 115/82  Pulse 76  Temp(Src) 98.5 F (36.9 C) (Oral)  Resp 18   SpO2 99%  LMP 03/19/1985 Physical Exam  Nursing note and vitals reviewed. Constitutional: She is oriented to person, place, and time. She appears well-developed and well-nourished. No distress.  HENT:  Head: Normocephalic and atraumatic.  Right Ear: External ear normal.  Left Ear: External ear normal.  Nose: Nose normal.  Mouth/Throat: Oropharynx is clear and moist.  Eyes: Conjunctivae are normal.  Neck: Normal range of motion. Neck supple.  Cardiovascular: Normal rate, regular rhythm and normal heart sounds.   Pulmonary/Chest: Effort normal and breath sounds normal.  Abdominal: Soft. Bowel sounds are normal. She exhibits no distension. There is no tenderness. There is no rebound and no guarding.  Musculoskeletal: Normal range of motion.       Right ankle: Normal.       Left ankle: Normal.       Right foot: She exhibits tenderness and swelling (mild (chronic per patient)). She exhibits normal range of motion, no bony tenderness, normal capillary refill, no crepitus, no deformity and no laceration.       Left foot: Normal.  Neurological: She is alert and oriented to person, place, and time.  Skin: Skin is warm and dry. She is not diaphoretic.  Psychiatric: She has a normal mood and affect.   Exam performed by Baron Sane L,  exam chaperoned Date: 08/03/2014 Pelvic exam: normal external genitalia without evidence of trauma. VULVA: normal appearing vulva with no masses, tenderness or lesion. VAGINA: normal appearing vagina with normal color and discharge, no lesions. CERVIX: normal appearing cervix without lesions, cervical motion tenderness absent, cervical os closed with out purulent discharge; vaginal discharge - white, Wet prep and DNA probe for chlamydia and GC obtained.   ADNEXA: normal adnexa in size, nontender and no masses UTERUS: uterus is normal size, shape, consistency and nontender.   ED Course  Procedures (including critical care time) Medications   oxyCODONE-acetaminophen (PERCOCET/ROXICET) 5-325 MG per tablet 2 tablet (2 tablets Oral Given 08/03/14 1056)    Labs Review Labs Reviewed  WET PREP, GENITAL - Abnormal; Notable for the following:    Clue Cells Wet Prep HPF POC FEW (*)    WBC, Wet Prep HPF POC FEW (*)    All other components within normal limits  URINALYSIS, ROUTINE W REFLEX MICROSCOPIC - Abnormal; Notable for the following:    APPearance CLOUDY (*)    pH 8.5 (*)    Protein, ur 30 (*)    All other components within normal limits  URINE MICROSCOPIC-ADD ON - Abnormal; Notable for the following:    Bacteria, UA FEW (*)    All other components within normal limits  GC/CHLAMYDIA PROBE AMP  PREGNANCY, URINE    Imaging Review No results found.   EKG Interpretation None      MDM   Final diagnoses:  Chronic foot pain, right  Bacterial vaginosis    Filed Vitals:   08/03/14 1213  BP: 115/82  Pulse: 76  Temp:   Resp: 18   Afebrile, NAD, non-toxic appearing, AAOx4.   1) Foot Pain: chronic in nature. Neurovascularly intact. Normal sensation. No evidence of compartment syndrome. Will treat pain. Advised PCP and podiatry followup. Other noted injury did not fill patient needs any imaging at this time. She is agreeable  2) Bacterial vaginosis: Pelvic exam is unremarkable aside from white vaginal discharge. There is no cervical motion tenderness, adnexal fullness or tenderness. Wet shows clue cells. GC and Chlamydia probe sent, given no cervical motion tenderness, no recent sexual intercourse or concern for sexually transmitted diseases will hold off on prophylactically treating patient. She is agreeable to this plan.  Return precautions discussed. Patient is agreeable to plan. Patient is stable at time of discharge    Harlow Mares, PA-C 08/03/14 1524

## 2014-08-03 NOTE — Discharge Instructions (Signed)
Please follow up with your primary care physician in 1-2 days. If you do not have one please call the [facility] number listed above. Please follow up with your podiatrist to schedule a follow up appointment.  Please take your antibiotic until completion. Please take pain medication and/or muscle relaxants as prescribed and as needed for pain. Please do not drive on narcotic pain medication or on muscle relaxants. Please read all discharge instructions and return precautions.   Bacterial Vaginosis Bacterial vaginosis is a vaginal infection that occurs when the normal balance of bacteria in the vagina is disrupted. It results from an overgrowth of certain bacteria. This is the most common vaginal infection in women of childbearing age. Treatment is important to prevent complications, especially in pregnant women, as it can cause a premature delivery. CAUSES  Bacterial vaginosis is caused by an increase in harmful bacteria that are normally present in smaller amounts in the vagina. Several different kinds of bacteria can cause bacterial vaginosis. However, the reason that the condition develops is not fully understood. RISK FACTORS Certain activities or behaviors can put you at an increased risk of developing bacterial vaginosis, including:  Having a new sex partner or multiple sex partners.  Douching.  Using an intrauterine device (IUD) for contraception. Women do not get bacterial vaginosis from toilet seats, bedding, swimming pools, or contact with objects around them. SIGNS AND SYMPTOMS  Some women with bacterial vaginosis have no signs or symptoms. Common symptoms include:  Grey vaginal discharge.  A fishlike odor with discharge, especially after sexual intercourse.  Itching or burning of the vagina and vulva.  Burning or pain with urination. DIAGNOSIS  Your health care provider will take a medical history and examine the vagina for signs of bacterial vaginosis. A  sample of vaginal fluid may be taken. Your health care provider will look at this sample under a microscope to check for bacteria and abnormal cells. A vaginal pH test may also be done.  TREATMENT  Bacterial vaginosis may be treated with antibiotic medicines. These may be given in the form of a pill or a vaginal cream. A second round of antibiotics may be prescribed if the condition comes back after treatment.  HOME CARE INSTRUCTIONS   Only take over-the-counter or prescription medicines as directed by your health care provider.  If antibiotic medicine was prescribed, take it as directed. Make sure you finish it even if you start to feel better.  Do not have sex until treatment is completed.  Tell all sexual partners that you have a vaginal infection. They should see their health care provider and be treated if they have problems, such as a mild rash or itching.  Practice safe sex by using condoms and only having one sex partner. SEEK MEDICAL CARE IF:   Your symptoms are not improving after 3 days of treatment.  You have increased discharge or pain.  You have a fever. MAKE SURE YOU:   Understand these instructions.  Will watch your condition.  Will get help right away if you are not doing well or get worse. FOR MORE INFORMATION  Centers for Disease Control and Prevention, Division of STD Prevention: [employer]raiser[employer]raud[employer] American Sexual Health Association (ASHA): www.ashastd.org  Document Released: 10/02/2005 Document Revised: 07/23/2013 Document Reviewed: 05/14/2013 [facility] Patient Information 2015 [place], [place]. This information is not intended to replace advice given to you by your health care provider. Make sure you discuss any questions you have with your health care provider.

## 2014-08-04 LAB — GC/CHLAMYDIA PROBE AMP
CT Probe RNA: NEGATIVE
GC Probe RNA: NEGATIVE

## 2014-09-14 ENCOUNTER — Telehealth: Payer: Self-pay | Admitting: *Deleted

## 2014-09-14 NOTE — Telephone Encounter (Signed)
"  I called last week and left a message.  I don't know if you have tried to call me or not because I ran out of minutes.  I'm calling from somewhere else.  I want to schedule surgery.  The reason I canceled before is because I didn't have anyone reliable to bring me."  I asked her what day she had in mine.  "It was scheduled on a Monday before because she had to work it out with another doctor to help her."  I offered her 10/05/14.  "That will be fine, I guess I won't be going anywhere for Christmas.!  What time will I need to get there?"  I told her that the surgical center normally calls a day or 2 prior to the surgery date.  "My phone should be back on by then but I will call over there now and ask, take advantage of using this phone while I have a chance."  Patient stated she has already signed consent forms.  I updated patient's phone number and address.

## 2014-09-14 NOTE — Telephone Encounter (Signed)
-----   Message from Roney Jaffe, RN sent at 09/08/2014  8:07 AM EST ----- This patient needs to reschedule her surgery [phone]

## 2014-10-05 DIAGNOSIS — M21549 Acquired clubfoot, unspecified foot: Secondary | ICD-10-CM

## 2014-10-05 DIAGNOSIS — Q6681 Congenital vertical talus deformity, right foot: Secondary | ICD-10-CM | POA: Diagnosis not present

## 2014-10-07 ENCOUNTER — Encounter: Payer: Medicare Other | Admitting: Podiatrist

## 2014-10-15 ENCOUNTER — Encounter: Payer: Self-pay | Admitting: Podiatry

## 2014-10-15 ENCOUNTER — Encounter: Payer: Self-pay | Admitting: Podiatrist

## 2014-10-23 ENCOUNTER — Ambulatory Visit (INDEPENDENT_AMBULATORY_CARE_PROVIDER_SITE_OTHER): Payer: Medicaid Other

## 2014-10-23 ENCOUNTER — Ambulatory Visit (INDEPENDENT_AMBULATORY_CARE_PROVIDER_SITE_OTHER): Payer: Medicaid Other | Admitting: Podiatrist

## 2014-10-23 ENCOUNTER — Encounter: Payer: Self-pay | Admitting: Podiatrist

## 2014-10-23 VITALS — BP 125/92 | HR 83 | Resp 16

## 2014-10-23 DIAGNOSIS — M19071 Primary osteoarthritis, right ankle and foot: Secondary | ICD-10-CM

## 2014-10-23 DIAGNOSIS — Z9889 Other specified postprocedural states: Secondary | ICD-10-CM

## 2014-10-23 MED ORDER — OXYCODONE-ACETAMINOPHEN 10-325 MG PO TABS: 1.0000 | ORAL_TABLET | Freq: Four times a day (QID) | ORAL | Status: DC | PRN

## 2014-10-23 NOTE — Progress Notes (Signed)
Patient has been walking in her fiberglass cast. There is wear on the bottom sole of the cast to the point that it has made a hole at the heel area.

## 2014-10-23 NOTE — Patient Instructions (Signed)
Continue using your crutches to keep all of the weight off of your foot.  I will see you back in 2 weeks to re evaluate the foot and possibly put you in a boot at that time.

## 2014-10-29 NOTE — Progress Notes (Signed)
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  Date of surgery 12/221/2015. The patient did not show up for her first postoperative appointment and relates that she has been putting weight on her foot. She has a below-knee cast and there is wear through the heel. No cast material is present on the heel area. The cast is also dirty. Patient denies nausea, vomiting, fevers, chills or night sweats.  Denies calf pain or tenderness to the operative side. She states that the foot hurts and that she is in need of more pain medication.  Objective: Below-knee fiberglass cast is removed. There is very minimal swelling noted. Neurovascular status is intact with palpable pedal pulses DP and PT bilateral at 2+ out of 4. Neurological sensation is intact and unchanged as per prior to surgery. Excellent appearance of the postoperative foot is noted. There is some mild dehiscence that on the proximal portion of the incision site on the dorsal aspect of the foot. Lateral heel incision site is completely healed with sutures in place. X-ray show well healing, revision right foot with healing and of the bone graft from the heel as well as healing at the medial cuneiform. Plate fixation and good alignment and position.  Assessment: Status post revision cotton osteotomy right foot date of surgery 10/05/2014  Plan:  A fiberglass cast was reapplied to the patient's right foot with a reinforcement on the plantar portion of the cast for the high probability that she will continue to place weight on the cast. She is given strict instructions to be nonweightbearing and to use the crutches at all times.sutures were also removed at today's visit and Iodosorb was placed at the dehiscence site. The dehiscence is very superficial and should go on to heal uneventfully.  I did  refill her pain medication and I will see her back in 2 weeks for follow-up .

## 2014-10-30 NOTE — Progress Notes (Signed)
  Patient did not show up for her first post operative visit following surgery-- she was given specific instructions to be at this appointment after surgery.  Patient did not call to cancel this appointment.

## 2014-11-05 ENCOUNTER — Encounter: Payer: Medicaid Other | Admitting: Podiatrist

## 2014-11-06 ENCOUNTER — Ambulatory Visit: Payer: Medicaid Other | Admitting: Podiatrist

## 2014-11-11 ENCOUNTER — Encounter: Payer: Self-pay | Admitting: Podiatrist

## 2014-11-11 ENCOUNTER — Ambulatory Visit (INDEPENDENT_AMBULATORY_CARE_PROVIDER_SITE_OTHER): Payer: 59

## 2014-11-11 ENCOUNTER — Ambulatory Visit (INDEPENDENT_AMBULATORY_CARE_PROVIDER_SITE_OTHER): Payer: Medicaid Other | Admitting: Podiatrist

## 2014-11-11 VITALS — BP 134/72 | HR 88 | Resp 16

## 2014-11-11 DIAGNOSIS — M19071 Primary osteoarthritis, right ankle and foot: Secondary | ICD-10-CM | POA: Diagnosis not present

## 2014-11-11 MED ORDER — OXYCODONE-ACETAMINOPHEN 10-325 MG PO TABS: 1.0000 | ORAL_TABLET | Freq: Four times a day (QID) | ORAL | Status: DC | PRN

## 2014-11-11 NOTE — Patient Instructions (Signed)
Only put about 10% of your weight on your foot-- do not go without your boot--  You may also get your foot wet now.  Just be very careful getting in and out of the tub or shower.

## 2014-11-11 NOTE — Progress Notes (Signed)
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  Date of surgery 10/05/2014.she relates that the foot is "so sore"    Objective: Below-knee fiberglass cast is removed. There is very minimal swelling noted. Neurovascular status is intact with palpable pedal pulses DP and PT bilateral at 2+ out of 4. Neurological sensation is intact and unchanged as per prior to surgery. Excellent appearance of the postoperative foot is noted. Complete healing of the incision site on the dorsal aspect of the foot. Lateral heel incision site is completely healed    X-ray show well healing, revision right foot with healing and of the bone graft from the heel as well as healing at the medial cuneiform. Plate fixation and good alignment and position.  Assessment: Status post revision cotton osteotomy right foot date of surgery 10/05/2014  Plan: removed the fiberglass cast and applied a below knee boot.  Gave specific instructions to continue using the crutches and maintaing her non weight bearing status.  She may get the foot wet and is given instructions to not put any weight on her foot while in the shower.   I did refill her pain medication and I will see her back in 2 weeks for follow-up .

## 2014-12-02 ENCOUNTER — Encounter: Payer: Medicaid Other | Admitting: Podiatrist

## 2014-12-18 ENCOUNTER — Encounter: Payer: Self-pay | Admitting: Podiatrist

## 2014-12-18 ENCOUNTER — Ambulatory Visit (INDEPENDENT_AMBULATORY_CARE_PROVIDER_SITE_OTHER): Payer: Medicare Other | Admitting: Podiatrist

## 2014-12-18 ENCOUNTER — Ambulatory Visit (INDEPENDENT_AMBULATORY_CARE_PROVIDER_SITE_OTHER): Payer: Medicare Other

## 2014-12-18 VITALS — BP 141/81 | HR 91 | Resp 16

## 2014-12-18 DIAGNOSIS — Z9889 Other specified postprocedural states: Secondary | ICD-10-CM

## 2014-12-18 DIAGNOSIS — M19071 Primary osteoarthritis, right ankle and foot: Secondary | ICD-10-CM | POA: Diagnosis not present

## 2014-12-18 MED ORDER — OXYCODONE-ACETAMINOPHEN 10-325 MG PO TABS: 1.0000 | ORAL_TABLET | Freq: Four times a day (QID) | ORAL | Status: DC | PRN

## 2014-12-18 NOTE — Progress Notes (Signed)
Chief Complaint  Patient presents with  . Routine Post Op    DOS 10-05-2014 POV Revision cotton tarsal osteotomy right  "Its still hurting but I'm down to one crutch now"      Objective:  Excellent appearance of the postoperative foot is noted. Complete healing of the incision site on the dorsal aspect of the foot. Lateral heel incision site is completely healed and there is some swelling underlying which is painful when pressed. Generalized discomfort on the medial aspect of the foot is also noted but is improving daily.   X-ray show well healing, revision right foot with healing and of the bone graft from the heel as well as healing at the medial cuneiform. Plate fixation and good alignment and position.  Assessment: Status post revision cotton osteotomy right foot date of surgery 10/05/2014  Plan: I Told her to stay in the boot for one more month and dispensed a Darco shoe for at home use recommended surgi grip for the swelling.  I did refill her pain medication and I will see her back in 2 weeks for follow-up .

## 2015-01-20 ENCOUNTER — Encounter: Payer: Self-pay | Admitting: Podiatrist

## 2015-01-20 ENCOUNTER — Ambulatory Visit (INDEPENDENT_AMBULATORY_CARE_PROVIDER_SITE_OTHER): Payer: Medicare Other | Admitting: Podiatrist

## 2015-01-20 ENCOUNTER — Ambulatory Visit (INDEPENDENT_AMBULATORY_CARE_PROVIDER_SITE_OTHER): Payer: Medicare Other

## 2015-01-20 VITALS — BP 124/89 | HR 70 | Resp 15

## 2015-01-20 DIAGNOSIS — Z9889 Other specified postprocedural states: Secondary | ICD-10-CM | POA: Diagnosis not present

## 2015-01-20 DIAGNOSIS — M19071 Primary osteoarthritis, right ankle and foot: Secondary | ICD-10-CM

## 2015-01-20 MED ORDER — OXYCODONE-ACETAMINOPHEN 10-325 MG PO TABS: 1.0000 | ORAL_TABLET | Freq: Four times a day (QID) | ORAL | Status: DC | PRN

## 2015-01-20 NOTE — Progress Notes (Signed)
Chief Complaint  Patient presents with  . Routine Post Op    right foot still hurting      Objective:  Excellent appearance of the postoperative foot is noted. Complete healing of the incision site on the dorsal aspect of the foot. Lateral heel incision site is completely healed and there is some swelling underlying which is painful when pressed. Generalized discomfort on the medial aspect of the foot is also noted and appears unchanged. Subjectively the patient relates that the foot still hurts and has not been improved by the surgery.   X-ray show a healing medial cuneiform. Plate fixation and good alignment and position.  Assessment: Status post revision cotton osteotomy right foot date of surgery 10/05/2014  Plan: I will order a CT scan to see the extent of healing and will plan to remove the plate if indicated. A CT scan will be ordered and I'll follow-up with her for the result. In the meantime she will continue wearing her boot.

## 2015-01-28 ENCOUNTER — Telehealth: Payer: Self-pay | Admitting: *Deleted

## 2015-01-28 NOTE — Telephone Encounter (Signed)
"  Coming in on 01/29/2015 for a CT Scan (15056).  Prior authorization is required.  She has [employer].  Give me a call."  "Did you get my message about authorization of her CT Scan?"  No, I don't think I did.  I will work on it.  I called and informed Anderson Malta that I got authorization of the CT Scan.  Authorization number is [id number] and it expires on 03/14/2015.  Does she have Medicaid, does that need to be authorized too?  "Okay thank you.  She does have Medicaid but it looks like it is secondary.  Normally if primary authorizes it will be okay, authorization not needed."

## 2015-01-29 ENCOUNTER — Other Ambulatory Visit: Payer: Self-pay

## 2015-02-04 ENCOUNTER — Ambulatory Visit
Admission: RE | Admit: 2015-02-04 | Discharge: 2015-02-04 | Disposition: A | Discharge status: Home / Self Care | Payer: Medicaid Other | Source: Ambulatory Visit | Attending: Podiatrist | Admitting: Podiatrist

## 2015-02-04 DIAGNOSIS — Z9889 Other specified postprocedural states: Secondary | ICD-10-CM

## 2015-02-04 DIAGNOSIS — M19071 Primary osteoarthritis, right ankle and foot: Secondary | ICD-10-CM

## 2015-02-05 ENCOUNTER — Telehealth: Payer: Self-pay | Admitting: Podiatrist

## 2015-02-05 ENCOUNTER — Ambulatory Visit (INDEPENDENT_AMBULATORY_CARE_PROVIDER_SITE_OTHER): Payer: Medicare Other | Admitting: Podiatrist

## 2015-02-05 VITALS — BP 128/93 | HR 71 | Resp 14

## 2015-02-05 DIAGNOSIS — Z9889 Other specified postprocedural states: Secondary | ICD-10-CM

## 2015-02-05 DIAGNOSIS — M9689 Other intraoperative and postprocedural complications and disorders of the musculoskeletal system: Secondary | ICD-10-CM

## 2015-02-05 MED ORDER — OXYCODONE-ACETAMINOPHEN 10-325 MG PO TABS: 1.0000 | ORAL_TABLET | Freq: Four times a day (QID) | ORAL | Status: DC | PRN

## 2015-02-05 NOTE — Telephone Encounter (Signed)
PT WANTED TO PLEASE CHANGE THE DATE OF HER SURGERY AT THE SURGERY CENTER FROM 5/2 TO 5/9.

## 2015-02-05 NOTE — Patient Instructions (Signed)
Pre-Operative Instructions  Congratulations, you have decided to take an important step to improving your quality of life.  You can be assured that the doctors of [facility] will be with you every step of the way.  1. Plan to be at the surgery center/hospital at least 1 (one) hour prior to your scheduled time unless otherwise directed by the surgical center/hospital staff.  You must have a responsible adult accompany you, remain during the surgery and drive you home.  Make sure you have directions to the surgical center/hospital and know how to get there on time. 2. For hospital based surgery you will need to obtain a history and physical form from your family physician within 1 month prior to the date of surgery- we will give you a form for you primary physician.  3. We make every effort to accommodate the date you request for surgery.  There are however, times where surgery dates or times have to be moved.  We will contact you as soon as possible if a change in schedule is required.   4. No Aspirin/Ibuprofen for one week before surgery.  If you are on aspirin, any non-steroidal anti-inflammatory medications (Mobic, Aleve, Ibuprofen) you should stop taking it 7 days prior to your surgery.  You make take Tylenol  For pain prior to surgery.  5. Medications- If you are taking daily heart and blood pressure medications, seizure, reflux, allergy, asthma, anxiety, pain or diabetes medications, make sure the surgery center/hospital is aware before the day of surgery so they may notify you which medications to take or avoid the day of surgery. 6. No food or drink after midnight the night before surgery unless directed otherwise by surgical center/hospital staff. 7. No alcoholic beverages 24 hours prior to surgery.  No smoking 24 hours prior to or 24 hours after surgery. 8. Wear loose pants or shorts- loose enough to fit over bandages, boots, and casts. 9. No slip on shoes, sneakers are best. 10. Bring  your boot with you to the surgery center/hospital.  Also bring crutches or a walker if your physician has prescribed it for you.  If you do not have this equipment, it will be provided for you after surgery. 11. If you have not been contracted by the surgery center/hospital by the day before your surgery, call to confirm the date and time of your surgery. 12. Leave-time from work may vary depending on the type of surgery you have.  Appropriate arrangements should be made prior to surgery with your employer. 13. Prescriptions will be provided immediately following surgery by your doctor.  Have these filled as soon as possible after surgery and take the medication as directed. 14. Remove nail polish on the operative foot. 15. Wash the night before surgery.  The night before surgery wash the foot and leg well with the antibacterial soap provided and water paying special attention to beneath the toenails and in between the toes.  Rinse thoroughly with water and dry well with a towel.  Perform this wash unless told not to do so by your physician.  Enclosed: 1 Ice pack (please put in freezer the night before surgery)   1 Hibiclens skin cleaner   Pre-op Instructions  If you have any questions regarding the instructions, do not hesitate to call our office.  [place]: [address] [place], [place] [address] [phone]  [place]: [address] [place], [place] [address] [phone]  [place]: [address]  [place], [place] [address] [phone]  Dr. Richard   Tuchman DPM, Dr. Norman Regal DPM Dr. Richard Sikora DPM, Dr. M. Todd Hyatt DPM, Dr. Kenon Delashmit DPM 

## 2015-02-08 NOTE — Progress Notes (Signed)
Chief Complaint  Patient presents with  . Foot Pain    "Well it hurt so bad yesterday, I think it feels a wee bit better today." right foot   Subjective: Katrina Kelley presents for folow up of revision of non healing cotton osteotomy with autologous calcaneus bone graft.  She had a CT scan performed and is here for the result.  CT scan  IMPRESSION: Status post osteotomy and bone graft placement in the medial cuneiform as described. Bone graft material appears to be coalescing but does not bridge to the cuneiform. Fusion plate is intact without evidence of loosening.  Osteotomy defect dorsal calcaneus appears healed.  Objective:  Neurovascular status intact and unchanged.  Continued pain medial aspect of the right foot at the site of the surgery is noted.  No pain in the calcaneus noted.  Moderate swelling present but improved.  Incision site is completely healed.     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  As it has been since 10/05/14 and this would be a delay in healing this bone from the surgery.  Will tell her to go back into her air fracture walker.  Will see her for follow up in 3-4 weeks.  I refilled her Oxycodone/apap 10/325 as she continues to complain of pain relieved by this medication.

## 2015-02-09 NOTE — Telephone Encounter (Signed)
-----   Message from Bronson Ing, DPM sent at 02/08/2015 10:17 PM EDT ----- Regarding: postpone this patients surgery Hey D,  Would you please call Keiera and let her know that I had a chance to review her CT scan and we need to hold of on removing her plate-- let her know that it is healing, but is not healed enough to remove the plate at this time.  I also need to see if you can contact Donley Redder and order her a bone stimulator-- please also inform cayli we are working on getting her a bone stimulator to get her foot to heal up faster.  Also please tell her to start wearing her boot again and as soon as the bone heals a little more we can take of the plate for her at that time.    I think she was either scheduling for next Monday or Wednesday if I remember correctly.  Thank you!  If she asks to speak to me, just let me know and I'll call her .  E

## 2015-02-09 NOTE — Telephone Encounter (Signed)
Per Dr. Valentina Lucks, I called and informed patient that Dr.Egerton wants to post-pone surgery.  She said it hasn't healed enough to remove the plate.  She is arranging for you to get a bone stimulator and she wants you to start wearing boot again.  "Okay, I was going to call you because the surgery date wasn't good for me.  So, I'll start wearing the boot again and look for a bone stimulator to come?"  Yes, that's correct.  "I don't have an appointment to see her again.  When does she want me back?"  She wants you back in 3-4 weeks.  "Can I schedule that now?"  Let me transfer you to a scheduler.  Trey from [facility] was here in the office.  I gave him clinical notes, demographics and insurance information to get authorization for MRI.  I called and left a message for Caren Griffins at the surgical center.  Please cancel her surgery scheduled for 02/15/2015.  Dr. Valentina Lucks said her bone has not healed enough to remove plate.

## 2015-03-03 ENCOUNTER — Ambulatory Visit (INDEPENDENT_AMBULATORY_CARE_PROVIDER_SITE_OTHER): Payer: Medicare Other | Admitting: Podiatrist

## 2015-03-03 VITALS — BP 141/91 | HR 84 | Resp 15

## 2015-03-03 DIAGNOSIS — M9689 Other intraoperative and postprocedural complications and disorders of the musculoskeletal system: Secondary | ICD-10-CM

## 2015-03-03 DIAGNOSIS — Z9889 Other specified postprocedural states: Secondary | ICD-10-CM

## 2015-03-03 MED ORDER — OXYCODONE-ACETAMINOPHEN 10-325 MG PO TABS: 1.0000 | ORAL_TABLET | Freq: Four times a day (QID) | ORAL | Status: DC | PRN

## 2015-03-03 NOTE — Patient Instructions (Signed)
Use the bone stimulator as instructed by Lytle Michaels.  Keep wearing the boot as well.  Dr. Jacqualyn Posey (you'll really like him) is going to check on the progress of your foot in 4 weeks.

## 2015-03-04 DIAGNOSIS — S92241D Displaced fracture of medial cuneiform of right foot, subsequent encounter for fracture with routine healing: Secondary | ICD-10-CM | POA: Diagnosis not present
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
## 2015-03-09 DIAGNOSIS — Z1389 Encounter for screening for other disorder: Secondary | ICD-10-CM | POA: Diagnosis not present

## 2015-03-09 DIAGNOSIS — F17210 Nicotine dependence, cigarettes, uncomplicated: Secondary | ICD-10-CM | POA: Diagnosis not present

## 2015-03-09 DIAGNOSIS — N760 Acute vaginitis: Secondary | ICD-10-CM | POA: Diagnosis not present

## 2015-03-09 DIAGNOSIS — Z13220 Encounter for screening for lipoid disorders: Secondary | ICD-10-CM | POA: Diagnosis not present

## 2015-03-09 DIAGNOSIS — F329 Major depressive disorder, single episode, unspecified: Secondary | ICD-10-CM | POA: Diagnosis not present

## 2015-03-09 DIAGNOSIS — Z131 Encounter for screening for diabetes mellitus: Secondary | ICD-10-CM | POA: Diagnosis not present

## 2015-03-09 DIAGNOSIS — E559 Vitamin D deficiency, unspecified: Secondary | ICD-10-CM | POA: Diagnosis not present

## 2015-03-09 DIAGNOSIS — K219 Gastro-esophageal reflux disease without esophagitis: Secondary | ICD-10-CM | POA: Diagnosis not present

## 2015-03-30 DIAGNOSIS — F1920 Other psychoactive substance dependence, uncomplicated: Secondary | ICD-10-CM | POA: Diagnosis not present

## 2015-03-30 DIAGNOSIS — Z5181 Encounter for therapeutic drug level monitoring: Secondary | ICD-10-CM | POA: Diagnosis not present

## 2015-03-31 ENCOUNTER — Encounter: Payer: Self-pay | Admitting: Podiatry

## 2015-03-31 ENCOUNTER — Ambulatory Visit (INDEPENDENT_AMBULATORY_CARE_PROVIDER_SITE_OTHER): Payer: Medicare Other

## 2015-03-31 ENCOUNTER — Ambulatory Visit (INDEPENDENT_AMBULATORY_CARE_PROVIDER_SITE_OTHER): Payer: Medicare Other | Admitting: Podiatry

## 2015-03-31 VITALS — BP 143/99 | HR 73 | Resp 15

## 2015-03-31 DIAGNOSIS — Z9889 Other specified postprocedural states: Secondary | ICD-10-CM

## 2015-03-31 MED ORDER — OXYCODONE-ACETAMINOPHEN 10-325 MG PO TABS: 1.0000 | ORAL_TABLET | Freq: Four times a day (QID) | ORAL | Status: DC | PRN

## 2015-03-31 NOTE — Progress Notes (Signed)
Patient ID: Katrina Kelley, female   DOB: Nov 26, 1955, 59 y.o.   MRN: [id number]  DOS: 10/05/14 Right Cotton Osteotomy  Subjective: 59 year old female presents the office today status post right foot cotton osteotomy revision performed in December 2015. She states that since last appointment she has continued use on the bone similar as directed. She does continue to have some discomfort along the surgical site although she does state has decreased somewhat. She does take Percocet for pain. She's been wearing the surgical shoe for which she presents in today. No other complaints at this time in no acute changes since last appointment.  Objective AAO x3, NAD DP/PT pulses palpable bilaterally, CRT less than 3 seconds Protective sensation intact with Simms Weinstein monofilament, vibratory sensation intact, Achilles tendon reflex intact Scar over the dorsal medial aspect of the right foot over the site of prior surgery is well-healed with scar formation. There is small amount of dorsal bony exostosis present off of the surgical site. There is mild to palpation over the area. There is trace edema however there is no associated erythema her increase in warmth. There is no other areas of tenderness to bilateral lower extremities. No open lesions or pre-ulcer lesions identified bilaterally. No pain with calf compression, swelling, warmth, erythema.  Assessment: 59 year old female status post revisional right foot cotton osteotomy with nonunion on CT scan   Plan : -X-rays were obtained and reviewed with the patient.  -Treatment options discussed including all alternatives, risks, and complications  -Recommended the patient to continue using bone sooner daily.  -Continue with surgical shoe/boot.  -Follow-up in 4 weeks. That time will order a repeat CT scan to further evaluate the healing. At that time we'll further discuss removal hardware and shave down the bony exostosis. The results of the CT scan.   -Refill Percocet.

## 2015-04-30 ENCOUNTER — Ambulatory Visit: Payer: Medicare Other | Admitting: Podiatry

## 2015-05-03 DIAGNOSIS — F1920 Other psychoactive substance dependence, uncomplicated: Secondary | ICD-10-CM | POA: Diagnosis not present

## 2015-05-03 DIAGNOSIS — Z5181 Encounter for therapeutic drug level monitoring: Secondary | ICD-10-CM | POA: Diagnosis not present

## 2015-05-07 DIAGNOSIS — Z5181 Encounter for therapeutic drug level monitoring: Secondary | ICD-10-CM | POA: Diagnosis not present

## 2015-05-07 DIAGNOSIS — F1920 Other psychoactive substance dependence, uncomplicated: Secondary | ICD-10-CM | POA: Diagnosis not present

## 2015-05-21 ENCOUNTER — Encounter: Payer: Medicare Other | Admitting: Podiatry

## 2015-05-27 NOTE — Progress Notes (Signed)
error 

## 2015-06-05 ENCOUNTER — Emergency Department (HOSPITAL_COMMUNITY)
Admission: EM | Admit: 2015-06-05 | Discharge: 2015-06-05 | Disposition: A | Discharge status: Home / Self Care | Payer: Medicare Other | Attending: Emergency Medicine | Admitting: Emergency Medicine

## 2015-06-05 ENCOUNTER — Encounter (HOSPITAL_COMMUNITY): Payer: Self-pay | Admitting: Emergency Medicine

## 2015-06-05 ENCOUNTER — Emergency Department (HOSPITAL_COMMUNITY): Payer: Medicare Other

## 2015-06-05 DIAGNOSIS — M25571 Pain in right ankle and joints of right foot: Secondary | ICD-10-CM | POA: Diagnosis not present

## 2015-06-05 DIAGNOSIS — Z91040 Latex allergy status: Secondary | ICD-10-CM | POA: Insufficient documentation

## 2015-06-05 DIAGNOSIS — M79671 Pain in right foot: Secondary | ICD-10-CM | POA: Diagnosis not present

## 2015-06-05 DIAGNOSIS — Z79899 Other long term (current) drug therapy: Secondary | ICD-10-CM | POA: Insufficient documentation

## 2015-06-05 DIAGNOSIS — F419 Anxiety disorder, unspecified: Secondary | ICD-10-CM | POA: Diagnosis not present

## 2015-06-05 DIAGNOSIS — M7989 Other specified soft tissue disorders: Secondary | ICD-10-CM | POA: Diagnosis not present

## 2015-06-05 DIAGNOSIS — Z880 Allergy status to penicillin: Secondary | ICD-10-CM | POA: Diagnosis not present

## 2015-06-05 DIAGNOSIS — G8929 Other chronic pain: Secondary | ICD-10-CM

## 2015-06-05 DIAGNOSIS — F319 Bipolar disorder, unspecified: Secondary | ICD-10-CM | POA: Diagnosis not present

## 2015-06-05 DIAGNOSIS — Z720 Tobacco use: Secondary | ICD-10-CM | POA: Diagnosis not present

## 2015-06-05 DIAGNOSIS — Z791 Long term (current) use of non-steroidal anti-inflammatories (NSAID): Secondary | ICD-10-CM | POA: Insufficient documentation

## 2015-06-05 DIAGNOSIS — A599 Trichomoniasis, unspecified: Secondary | ICD-10-CM | POA: Diagnosis not present

## 2015-06-05 DIAGNOSIS — N898 Other specified noninflammatory disorders of vagina: Secondary | ICD-10-CM | POA: Diagnosis present

## 2015-06-05 DIAGNOSIS — Z792 Long term (current) use of antibiotics: Secondary | ICD-10-CM | POA: Insufficient documentation

## 2015-06-05 LAB — URINALYSIS, ROUTINE W REFLEX MICROSCOPIC
Bilirubin Urine: NEGATIVE
Glucose, UA: NEGATIVE mg/dL
Hgb urine dipstick: NEGATIVE
Ketones, ur: NEGATIVE mg/dL
Nitrite: NEGATIVE
PROTEIN: NEGATIVE mg/dL
Specific Gravity, Urine: 1.024 (ref 1.005–1.030)
UROBILINOGEN UA: 1 mg/dL (ref 0.0–1.0)
pH: 6 (ref 5.0–8.0)

## 2015-06-05 LAB — WET PREP, GENITAL
CLUE CELLS WET PREP: NONE SEEN
YEAST WET PREP: NONE SEEN

## 2015-06-05 LAB — URINE MICROSCOPIC-ADD ON

## 2015-06-05 MED ORDER — OXYCODONE-ACETAMINOPHEN 5-325 MG PO TABS
1.0000 | ORAL_TABLET | Freq: Once | ORAL | Status: AC
  Administered 2015-06-05: 1 via ORAL
  Filled 2015-06-05: qty 1

## 2015-06-05 MED ORDER — METRONIDAZOLE 500 MG PO TABS
2000.0000 mg | ORAL_TABLET | Freq: Once | ORAL | Status: AC
  Administered 2015-06-05: 2000 mg via ORAL
  Filled 2015-06-05: qty 4

## 2015-06-05 MED ORDER — DIPHENHYDRAMINE HCL 25 MG PO CAPS
25.0000 mg | ORAL_CAPSULE | Freq: Once | ORAL | Status: AC
  Administered 2015-06-05: 25 mg via ORAL
  Filled 2015-06-05: qty 1

## 2015-06-05 MED ORDER — NITROFURANTOIN MONOHYD MACRO 100 MG PO CAPS: 100.0000 mg | ORAL_CAPSULE | Freq: Two times a day (BID) | ORAL | Status: DC

## 2015-06-05 MED ORDER — DIPHENHYDRAMINE HCL 25 MG PO TABS: 25.0000 mg | ORAL_TABLET | Freq: Four times a day (QID) | ORAL | Status: DC

## 2015-06-05 NOTE — ED Notes (Signed)
Pt refuses discharge VS. Pt stated ride was waiting. Pt given discharge meds and meal. Pt refused wheelchair and discharged home.

## 2015-06-05 NOTE — Discharge Instructions (Signed)
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
## 2015-06-05 NOTE — ED Notes (Signed)
Pt c/o right foot pain x 1 week, green vaginal discharge since last Thursday, and medicine refill for eczema.

## 2015-06-05 NOTE — ED Notes (Signed)
Patient transported to X-ray 

## 2015-06-07 ENCOUNTER — Encounter: Payer: Self-pay | Admitting: Podiatry

## 2015-06-07 ENCOUNTER — Ambulatory Visit (INDEPENDENT_AMBULATORY_CARE_PROVIDER_SITE_OTHER): Payer: Medicare Other | Admitting: Podiatry

## 2015-06-07 VITALS — BP 159/109 | HR 109 | Resp 18

## 2015-06-07 DIAGNOSIS — F1920 Other psychoactive substance dependence, uncomplicated: Secondary | ICD-10-CM | POA: Diagnosis not present

## 2015-06-07 DIAGNOSIS — M19071 Primary osteoarthritis, right ankle and foot: Secondary | ICD-10-CM | POA: Diagnosis not present

## 2015-06-07 DIAGNOSIS — Z5181 Encounter for therapeutic drug level monitoring: Secondary | ICD-10-CM | POA: Diagnosis not present

## 2015-06-07 LAB — GC/CHLAMYDIA PROBE AMP (~~LOC~~) NOT AT ARMC
Chlamydia: NEGATIVE
Neisseria Gonorrhea: NEGATIVE

## 2015-06-07 MED ORDER — OXYCODONE-ACETAMINOPHEN 10-325 MG PO TABS: 1.0000 | ORAL_TABLET | Freq: Three times a day (TID) | ORAL | Status: DC | PRN

## 2015-06-08 ENCOUNTER — Telehealth: Payer: Self-pay | Admitting: *Deleted

## 2015-06-08 DIAGNOSIS — M19071 Primary osteoarthritis, right ankle and foot: Secondary | ICD-10-CM

## 2015-06-08 NOTE — Telephone Encounter (Signed)
I dictated the note. Can we send that and see? Also, can I do a peer to peer?

## 2015-06-08 NOTE — Progress Notes (Signed)
Patient ID: Katrina Kelley, female   DOB: 1956-07-25, 59 y.o.   MRN: [id number]  DOS: 10/05/14 Right Cotton Osteotomy  Subjective: 59 year old female presents the office today status post right foot cotton osteotomy revision performed in December 2015. She states that since last appointment she has continued use on the bone stimulator as directed. She does continue to have some discomfort along the surgical site without any improvement. She says the pain is dystrophic on the surgical site and she is unable to wear shoe to the pain. She continues to take Percocet for pain. She's been wearing a regular sandal. No other complaints at this time in no acute changes since last appointment.  Objective AAO x3, NAD DP/PT pulses palpable bilaterally, CRT less than 3 seconds Protective sensation intact with Simms Weinstein monofilament, vibratory sensation intact Scar over the dorsal medial aspect of the right foot over the site of prior surgery is well-healed with scar formation. There is a dorsal bony exostosis present off of the surgical site. There is tenderness to palpation over the area overlying the surgical site. There is edema however there is no associated erythema her increase in warmth. There is no other areas of tenderness to bilateral lower extremities. No open lesions or pre-ulcer lesions identified bilaterally. No pain with calf compression, swelling, warmth, erythema.  Assessment: 59 year old female status post revisional right foot cotton osteotomy with nonunion  Plan: -Treatment options discussed including all alternatives, risks, and complications -At this time she continues to have pain without any relief I'm going to order repeat CT scan to assess the healing of the nonunion osteotomy site. She is likely to need another surgery to either remove the hardware however prior to any further surgical intervention I need to have a CT to assess the healing. -Recommended the patient to  continue using bone stimulator daily.  -Continue with surgical shoe/bootA aall times. -Follow-up after CT scan or sooner if any problems are to arise. -Refilled Percocet.   Celesta Gentile, DPM

## 2015-06-08 NOTE — Telephone Encounter (Addendum)
[employer] [id number] denied CT scan of right foot 73700, dx M19.071, case #[id number].  Faxed dictated note of 06/07/2015 to [phone].  Unable to leave a message to inform pt the CT Scan pre-cert was denied at this time and I had begun the appeals process.  Left message with female speaker to have pt call our office.  [facility] Medicare Prior Authorization given [id number], faxed to [phone] and informed pt.

## 2015-06-15 ENCOUNTER — Other Ambulatory Visit: Payer: Self-pay

## 2015-06-18 ENCOUNTER — Ambulatory Visit
Admission: RE | Admit: 2015-06-18 | Discharge: 2015-06-18 | Disposition: A | Discharge status: Home / Self Care | Payer: Medicare Other | Source: Ambulatory Visit | Attending: Podiatry | Admitting: Podiatry

## 2015-06-18 ENCOUNTER — Other Ambulatory Visit: Payer: Self-pay | Admitting: Podiatry

## 2015-06-18 ENCOUNTER — Telehealth: Payer: Self-pay | Admitting: *Deleted

## 2015-06-18 DIAGNOSIS — M19071 Primary osteoarthritis, right ankle and foot: Secondary | ICD-10-CM

## 2015-06-18 DIAGNOSIS — Z0389 Encounter for observation for other suspected diseases and conditions ruled out: Secondary | ICD-10-CM | POA: Diagnosis not present

## 2015-06-18 DIAGNOSIS — R7989 Other specified abnormal findings of blood chemistry: Secondary | ICD-10-CM

## 2015-06-18 NOTE — Telephone Encounter (Addendum)
-----   Message from [name], DPM sent at 06/18/2015 12:36 PM EDT ----- Can you order a Vitamin D level on her? It would be the 25-hydroxy vitamin D test.  Janett Billow, can you schedule a follow up with her to discuss CT results/surgery? Thanks. Left message informing pt Dr. Jacqualyn Posey was ordering Vitamin D level and to call for instructions to lab.

## 2015-06-23 DIAGNOSIS — Z5181 Encounter for therapeutic drug level monitoring: Secondary | ICD-10-CM | POA: Diagnosis not present

## 2015-06-23 DIAGNOSIS — F1920 Other psychoactive substance dependence, uncomplicated: Secondary | ICD-10-CM | POA: Diagnosis not present

## 2015-06-25 DIAGNOSIS — F1920 Other psychoactive substance dependence, uncomplicated: Secondary | ICD-10-CM | POA: Diagnosis not present

## 2015-06-25 DIAGNOSIS — Z5181 Encounter for therapeutic drug level monitoring: Secondary | ICD-10-CM | POA: Diagnosis not present

## 2015-07-05 DIAGNOSIS — Z5181 Encounter for therapeutic drug level monitoring: Secondary | ICD-10-CM | POA: Diagnosis not present

## 2015-07-05 DIAGNOSIS — F1920 Other psychoactive substance dependence, uncomplicated: Secondary | ICD-10-CM | POA: Diagnosis not present

## 2015-07-08 ENCOUNTER — Encounter (HOSPITAL_COMMUNITY): Payer: Self-pay

## 2015-07-08 ENCOUNTER — Emergency Department (HOSPITAL_COMMUNITY)
Admission: EM | Admit: 2015-07-08 | Discharge: 2015-07-08 | Disposition: A | Discharge status: Home / Self Care | Payer: Medicare Other | Attending: Emergency Medicine | Admitting: Emergency Medicine

## 2015-07-08 ENCOUNTER — Emergency Department (HOSPITAL_COMMUNITY): Payer: Medicare Other

## 2015-07-08 DIAGNOSIS — J069 Acute upper respiratory infection, unspecified: Secondary | ICD-10-CM | POA: Diagnosis not present

## 2015-07-08 DIAGNOSIS — Z720 Tobacco use: Secondary | ICD-10-CM | POA: Insufficient documentation

## 2015-07-08 DIAGNOSIS — F419 Anxiety disorder, unspecified: Secondary | ICD-10-CM | POA: Diagnosis not present

## 2015-07-08 DIAGNOSIS — Z79899 Other long term (current) drug therapy: Secondary | ICD-10-CM | POA: Diagnosis not present

## 2015-07-08 DIAGNOSIS — F17210 Nicotine dependence, cigarettes, uncomplicated: Secondary | ICD-10-CM | POA: Diagnosis not present

## 2015-07-08 DIAGNOSIS — J019 Acute sinusitis, unspecified: Secondary | ICD-10-CM | POA: Diagnosis not present

## 2015-07-08 DIAGNOSIS — R0602 Shortness of breath: Secondary | ICD-10-CM | POA: Diagnosis not present

## 2015-07-08 DIAGNOSIS — Z880 Allergy status to penicillin: Secondary | ICD-10-CM | POA: Diagnosis not present

## 2015-07-08 DIAGNOSIS — R05 Cough: Secondary | ICD-10-CM | POA: Diagnosis not present

## 2015-07-08 DIAGNOSIS — Z91040 Latex allergy status: Secondary | ICD-10-CM | POA: Diagnosis not present

## 2015-07-08 DIAGNOSIS — M5136 Other intervertebral disc degeneration, lumbar region: Secondary | ICD-10-CM | POA: Diagnosis not present

## 2015-07-08 DIAGNOSIS — Z791 Long term (current) use of non-steroidal anti-inflammatories (NSAID): Secondary | ICD-10-CM | POA: Diagnosis not present

## 2015-07-08 DIAGNOSIS — R1110 Vomiting, unspecified: Secondary | ICD-10-CM | POA: Diagnosis not present

## 2015-07-08 DIAGNOSIS — F319 Bipolar disorder, unspecified: Secondary | ICD-10-CM | POA: Insufficient documentation

## 2015-07-08 DIAGNOSIS — R079 Chest pain, unspecified: Secondary | ICD-10-CM | POA: Diagnosis not present

## 2015-07-08 DIAGNOSIS — K219 Gastro-esophageal reflux disease without esophagitis: Secondary | ICD-10-CM | POA: Diagnosis not present

## 2015-07-08 DIAGNOSIS — Z792 Long term (current) use of antibiotics: Secondary | ICD-10-CM | POA: Insufficient documentation

## 2015-07-08 DIAGNOSIS — M791 Myalgia: Secondary | ICD-10-CM | POA: Diagnosis present

## 2015-07-08 DIAGNOSIS — F329 Major depressive disorder, single episode, unspecified: Secondary | ICD-10-CM | POA: Diagnosis not present

## 2015-07-08 DIAGNOSIS — L2089 Other atopic dermatitis: Secondary | ICD-10-CM | POA: Diagnosis not present

## 2015-07-08 MED ORDER — PSEUDOEPHEDRINE HCL 30 MG PO TABS
30.0000 mg | ORAL_TABLET | Freq: Once | ORAL | Status: AC
  Administered 2015-07-08: 30 mg via ORAL
  Filled 2015-07-08: qty 1

## 2015-07-08 MED ORDER — TRAMADOL HCL 50 MG PO TABS: 50.0000 mg | ORAL_TABLET | Freq: Four times a day (QID) | ORAL | Status: DC | PRN

## 2015-07-08 MED ORDER — PREDNISONE 20 MG PO TABS: 40.0000 mg | ORAL_TABLET | ORAL | Status: DC

## 2015-07-08 MED ORDER — PREDNISONE 20 MG PO TABS: 40.0000 mg | ORAL_TABLET | Freq: Every day | ORAL | Status: AC

## 2015-07-08 MED ORDER — PSEUDOEPHEDRINE HCL 30 MG PO TABS: 30.0000 mg | ORAL_TABLET | Freq: Three times a day (TID) | ORAL | Status: DC | PRN

## 2015-07-08 NOTE — ED Provider Notes (Signed)
CSN: [id number]     Arrival date & time 07/08/15  0454 History   First MD Initiated Contact with Patient 07/08/15 0455     Chief Complaint  Patient presents with  . Generalized Body Aches     (Consider location/radiation/quality/duration/timing/severity/associated sxs/prior Treatment) HPI Patient presents with URI like symptoms. She was well until 2 days ago, since that time she's had persistent rhinorrhea, congestion, cough. Patient claims of subjective fever, denies objective fever, nausea, vomiting, chest pain. No relief with OTC medication. Patient is a resident in a homeless facility Past Medical History  Diagnosis Date  . Bipolar 1 disorder   . Depression   . Anxiety    Past Surgical History  Procedure Laterality Date  . Elevation of depressed skull fracture    . Abdominal hysterectomy    . Foot arthrodesis      rt and lt  . Fracture surgery  2005    fx both lower legs   . Tooth extraction  11/27/2011    Procedure: DENTAL RESTORATION/EXTRACTIONS;  Surgeon: [name], DDS;  Location: [place];  Service: Oral Surgery;  Laterality: N/A;  just extractions  . Laparoscopic appendectomy N/A 03/12/2014    Procedure: APPENDECTOMY LAPAROSCOPIC;  Surgeon: [name], MD;  Location: [place];  Service: General;  Laterality: N/A;   Family History  Problem Relation Age of Onset  . Hyperlipidemia Son    Social History  Substance Use Topics  . Smoking status: Current Every Day Smoker -- 0.25 packs/day for 38 years    Types: Cigarettes  . Smokeless tobacco: None  . Alcohol Use: No   OB History    No data available     Review of Systems  Constitutional:       Per HPI, otherwise negative  HENT:       Per HPI, otherwise negative  Respiratory:       Per HPI, otherwise negative  Cardiovascular:       Per HPI, otherwise negative  Gastrointestinal: Negative for vomiting.  Endocrine:       Negative aside from HPI  Genitourinary:       Neg aside from HPI    Musculoskeletal:       Per HPI, otherwise negative  Skin: Negative.   Neurological: Negative for syncope.      Allergies  Demerol; Latex; Penicillins; Strawberry; and Vicodin  Home Medications   Prior to Admission medications   Medication Sig Start Date End Date Taking? Authorizing Provider  citalopram (CELEXA) 10 MG tablet Take 10 mg by mouth daily.  09/17/13   Historical Provider, MD  diphenhydrAMINE (BENADRYL) 25 MG tablet Take 1 tablet (25 mg total) by mouth every 6 (six) hours. 06/05/15   Ezequiel Essex, MD  meloxicam (MOBIC) 15 MG tablet Take 1 tablet (15 mg total) by mouth daily. Take daily for 5 days, then as needed for pain. 06/25/14   Noland Fordyce, PA-C  meloxicam (MOBIC) 15 MG tablet Take 15 mg by mouth. 07/07/14   Historical Provider, MD  metroNIDAZOLE (FLAGYL) 500 MG tablet Take 1 tablet (500 mg total) by mouth 2 (two) times daily. 08/03/14   Jennifer Piepenbrink, PA-C  nitrofurantoin, macrocrystal-monohydrate, (MACROBID) 100 MG capsule Take 1 capsule (100 mg total) by mouth 2 (two) times daily. 06/05/15   Ezequiel Essex, MD  oxyCODONE-acetaminophen (PERCOCET) 10-325 MG per tablet Take 1 tablet by mouth every 8 (eight) hours as needed for pain. MAXIMUM TOTAL ACETAMINOPHEN DOSE IS 4000 MG PER DAY 06/07/15  [name], DPM   BP 121/84 mmHg  Pulse 82  Temp(Src) 97.6 F (36.4 C) (Oral)  Resp 18  Ht 5' 8.75" (1.746 m)  Wt 170 lb (77.111 kg)  BMI 25.29 kg/m2  SpO2 98%  LMP 03/19/1985 Physical Exam  Constitutional: She is oriented to person, place, and time. She appears well-developed and well-nourished. No distress.  HENT:  Head: Normocephalic and atraumatic.  Eyes: Conjunctivae and EOM are normal.  Cardiovascular: Normal rate and regular rhythm.   Pulmonary/Chest: Effort normal and breath sounds normal. No stridor. No respiratory distress. She has no wheezes.  Abdominal: She exhibits no distension.  Musculoskeletal: She exhibits no edema.  Neurological: She  is alert and oriented to person, place, and time. No cranial nerve deficit.  Skin: Skin is warm and dry.  Psychiatric: She has a normal mood and affect.  Nursing note and vitals reviewed.   ED Course  Procedures (including critical care time)  I reviewed the results (including imaging as performed), agree with the interpretation  On repeat exam the patient appears better.  We reviewed all findings.   MDM  Patient presents from her homeless shelter with concern of URI like illness, generalized complaints. Here the patient is awake, alert, afebrile, with no hypoxia, tachypnea, tachycardia, low suspicion for ACS, bacteremia or sepsis. No x-ray evidence of pneumonia. Patient started on medication for her congestion, discharged in stable condition.  Carmin Muskrat, MD 07/08/15 [phone]

## 2015-07-08 NOTE — Discharge Instructions (Signed)
As discussed, your evaluation today has been largely reassuring.  But, it is important that you monitor your condition carefully, and do not hesitate to return to the ED if you develop new, or concerning changes in your condition. ? ?Otherwise, please follow-up with your physician for appropriate ongoing care. ? ?
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
## 2015-07-09 DIAGNOSIS — Z5181 Encounter for therapeutic drug level monitoring: Secondary | ICD-10-CM | POA: Diagnosis not present

## 2015-07-09 DIAGNOSIS — F1920 Other psychoactive substance dependence, uncomplicated: Secondary | ICD-10-CM | POA: Diagnosis not present

## 2015-07-19 ENCOUNTER — Encounter: Payer: Self-pay | Admitting: Podiatry

## 2015-07-19 ENCOUNTER — Telehealth: Payer: Self-pay | Admitting: *Deleted

## 2015-07-19 ENCOUNTER — Institutional Professional Consult (permissible substitution) (INDEPENDENT_AMBULATORY_CARE_PROVIDER_SITE_OTHER): Payer: Medicare Other | Admitting: Podiatry

## 2015-07-19 VITALS — BP 130/74 | HR 65 | Resp 16

## 2015-07-19 DIAGNOSIS — M9689 Other intraoperative and postprocedural complications and disorders of the musculoskeletal system: Secondary | ICD-10-CM | POA: Diagnosis not present

## 2015-07-19 DIAGNOSIS — M19071 Primary osteoarthritis, right ankle and foot: Secondary | ICD-10-CM | POA: Diagnosis not present

## 2015-07-19 MED ORDER — OXYCODONE-ACETAMINOPHEN 10-325 MG PO TABS: 1.0000 | ORAL_TABLET | Freq: Three times a day (TID) | ORAL | Status: DC | PRN

## 2015-07-19 NOTE — Telephone Encounter (Signed)
Dr. Jacqualyn Posey request Vitamin D lab results.

## 2015-07-19 NOTE — Patient Instructions (Signed)
Pre-Operative Instructions  Congratulations, you have decided to take an important step to improving your quality of life.  You can be assured that the doctors of [facility] will be with you every step of the way.  1. Plan to be at the surgery center/hospital at least 1 (one) hour prior to your scheduled time unless otherwise directed by the surgical center/hospital staff.  You must have a responsible adult accompany you, remain during the surgery and drive you home.  Make sure you have directions to the surgical center/hospital and know how to get there on time. 2. For hospital based surgery you will need to obtain a history and physical form from your family physician within 1 month prior to the date of surgery- we will give you a form for you primary physician.  3. We make every effort to accommodate the date you request for surgery.  There are however, times where surgery dates or times have to be moved.  We will contact you as soon as possible if a change in schedule is required.   4. No Aspirin/Ibuprofen for one week before surgery.  If you are on aspirin, any non-steroidal anti-inflammatory medications (Mobic, Aleve, Ibuprofen) you should stop taking it 7 days prior to your surgery.  You make take Tylenol  For pain prior to surgery.  5. Medications- If you are taking daily heart and blood pressure medications, seizure, reflux, allergy, asthma, anxiety, pain or diabetes medications, make sure the surgery center/hospital is aware before the day of surgery so they may notify you which medications to take or avoid the day of surgery. 6. No food or drink after midnight the night before surgery unless directed otherwise by surgical center/hospital staff. 7. No alcoholic beverages 24 hours prior to surgery.  No smoking 24 hours prior to or 24 hours after surgery. 8. Wear loose pants or shorts- loose enough to fit over bandages, boots, and casts. 9. No slip on shoes, sneakers are best. 10. Bring  your boot with you to the surgery center/hospital.  Also bring crutches or a walker if your physician has prescribed it for you.  If you do not have this equipment, it will be provided for you after surgery. 11. If you have not been contracted by the surgery center/hospital by the day before your surgery, call to confirm the date and time of your surgery. 12. Leave-time from work may vary depending on the type of surgery you have.  Appropriate arrangements should be made prior to surgery with your employer. 13. Prescriptions will be provided immediately following surgery by your doctor.  Have these filled as soon as possible after surgery and take the medication as directed. 14. Remove nail polish on the operative foot. 15. Wash the night before surgery.  The night before surgery wash the foot and leg well with the antibacterial soap provided and water paying special attention to beneath the toenails and in between the toes.  Rinse thoroughly with water and dry well with a towel.  Perform this wash unless told not to do so by your physician.  Enclosed: 1 Ice pack (please put in freezer the night before surgery)   1 Hibiclens skin cleaner   Pre-op Instructions  If you have any questions regarding the instructions, do not hesitate to call our office.  [place]: [address] [place], [place] [address] [phone]  [place]: [address] [place], [place] [address] [phone]  [place]: [address]  , [place] [address] [phone]  Dr. Richard   Tuchman DPM, Dr. Norman Regal DPM Dr. Richard Sikora DPM, Dr. M. Todd Hyatt DPM, Dr. Kathryn Egerton DPM, Dr. Betsie Peckman DPM 

## 2015-07-22 NOTE — Progress Notes (Signed)
  Subjective:    Katrina Kelley is a 59 y.o. female who presents with right foot pain. Onset of the symptoms was several months ago. Precipitating event: Previous surgery with nonhealing of osteotomy and bone graft. Current symptoms include: Pain along the dorsal medial aspect of the foot overlying the area of previous surgery.  Aggravating factors: any weight bearing. Symptoms have gradually worsened. Patient has had prior foot problems. Evaluation to date: plain films: abnormal healing along the osteotomy site  and CT scan which shows no change in appearance of the medial cuneiform with unincorporated bone grafting material. The hardware is loose.. Treatment to date: nonweightbearing and previous surgery. The surgery has been revised once already.  The following portions of the patient's history were reviewed and updated as appropriate: allergies, current medications, past family history, past medical history, past social history, past surgical history and problem list.  Review of Systems Pertinent items are noted in HPI.    Objective:    BP 130/74 mmHg  Pulse 65  Resp 16  LMP 03/19/1985 Right foot:  There is continued tenderness to palpation overlying the dorsal aspect of the right medial midfoot onset of the prior surgery on the medial cuneiform. There is mild edema overlying the area without any associated erythema or increased warmth. There are no other areas pinpoint bony tenderness or pain the vibratory sensation. No open lesions or pre-ulcerative lesions.Calf compression, swelling, warmth, erythema. neurovascular status intact and unchanged  there does appear to be a dorsal exostosis off the medial midfoot.   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 Given the hardware is loose and she has continued tenderness over the area discussed hardware removal and exostectomy. She wishes to proceed with surgery given ongoing pain to this area. I discussed with her hardware removal as well as exostectomy to see if this helps alleviate her symptoms. Discussed this may not alleviate her symptoms and she may require further surgery. At this point the areas on his been revised once without healing. We will remove the hardware and see if this limits her symptoms. -The incision placement as well as the postoperative course was discussed with the patient. I discussed risks of the surgery which include, but not limited to, infection, bleeding, pain, swelling, need for further surgery, delayed or nonhealing, painful or ugly scar, numbness or sensation changes, over/under correction, recurrence, transfer lesions, further deformity, hardware failure, DVT/PE, loss of toe/foot. Patient understands these risks and wishes to proceed with surgery. The surgical consent was reviewed with the patient all 3 pages were signed. No promises or guarantees were given to the outcome of the procedure. All questions were answered to the best of my ability. Before the surgery the patient was encouraged to call the office if there is any further questions. The surgery will be performed at the [facility] on an outpatient basis.

## 2015-07-22 NOTE — H&P (Signed)
  Subjective: 59 year old female presents the office to discuss CT results and for continued pain on the right foot. She states that she continues have pain with weightbearing or pressure to the right foot. Mr. pain is overlying the prior surgery. She states that she is able to wear shoes given the spur on top of her foot as well. She feels that they're something sliding inside of her foot. No recent injury or trauma. No other complaints at this time no acute changes.  Objective: AAO 3, NAD Neurovascular status intact and unchanged Incisional the dorsal medial aspect of the midfoot as well coapted and the scars warmth. There is an exostosis off of the medial midfoot on the medial cuneiform over the prior surgery. There is tenderness to palpation overlying this area. There is no other areas of tenderness to bilateral lower extremity. There is mild edema along the dorsal medial midfoot. There is no scissoring erythema or increase in warmth. No other areas edema, erythema, increase in warmth. No open lesions or pre-ulcerative lesions. No pain with calf compression, swelling, warmth, erythema.  Assessment: 59 year old female with nonhealing Cotton osteotomy and loose hardware with continued pain  Plan: -Treatment options discussed including all alternatives, risks, and complications -CTs results were discussed with the patient as well as prior x-rays. -Due to continued pain and loose hardware I discussed with her hardware removal/exostosis. discussed by removing the hardware and taking the spur off this may limit her pain although she may continue have pain and may require surgery in the future. She is also hard he had one revision without healing. We'll try just remove the hardware and so this elevates her symptoms. She wishes to pursue this. -The incision placement as well as the postoperative course was discussed with the patient. I discussed risks of the surgery which include, but not limited to,  infection, bleeding, pain, swelling, need for further surgery, delayed or nonhealing, painful or ugly scar, numbness or sensation changes, over/under correction, recurrence, transfer lesions, further deformity, hardware failure, DVT/PE, loss of toe/foot. Patient understands these risks and wishes to proceed with surgery. The surgical consent was reviewed with the patient all 3 pages were signed. No promises or guarantees were given to the outcome of the procedure. All questions were answered to the best of my ability. Before the surgery the patient was encouraged to call the office if there is any further questions. The surgery will be performed at the [facility] on an outpatient basis.  Celesta Gentile, DPM

## 2015-07-26 ENCOUNTER — Telehealth: Payer: Self-pay | Admitting: *Deleted

## 2015-07-26 NOTE — Telephone Encounter (Signed)
"  I called the surgical center to speak to [facility] to pre-register.  I spoke to [place].  Can you call them and get it arranged?  I'd be very grateful.  You can also reach me at this number, [phone].

## 2015-07-26 NOTE — Telephone Encounter (Signed)
I faxed authorization for surgery scheduled for 08/04/2015 Tarsal Exostectomy left to [facility]. Authorization number is [id number].

## 2015-07-27 NOTE — Telephone Encounter (Signed)
I called and left Caren Griffins at [facility] a message to call Ms. Luevanos to register her.  She can be reached at [phone] or [phone].  She said she needs to speak to [facility] or [place]

## 2015-08-06 ENCOUNTER — Telehealth: Payer: Self-pay | Admitting: *Deleted

## 2015-08-09 NOTE — Telephone Encounter (Signed)
"  This is Rhunette Croft calling for [name].  [name] is no longer living in [place].  She moved to a crisis center in [place], [place].  She told me to get a hold of you because she needs to cancel that surgery that she was going to have on the 26th.  You can reach her at [phone].  That's her grand-daughter's number in [place].  She's using a friend's phone and asked me to call you.  Thank you."

## 2015-08-20 ENCOUNTER — Encounter: Payer: Self-pay | Admitting: Podiatry

## 2015-08-20 ENCOUNTER — Telehealth: Payer: Self-pay | Admitting: *Deleted

## 2015-08-24 ENCOUNTER — Encounter: Payer: Self-pay | Admitting: Podiatry

## 2015-08-24 ENCOUNTER — Ambulatory Visit (INDEPENDENT_AMBULATORY_CARE_PROVIDER_SITE_OTHER): Payer: Medicare Other | Admitting: Podiatry

## 2015-08-24 VITALS — BP 146/122 | HR 92 | Resp 18

## 2015-08-24 DIAGNOSIS — M19071 Primary osteoarthritis, right ankle and foot: Secondary | ICD-10-CM

## 2015-08-24 DIAGNOSIS — M9689 Other intraoperative and postprocedural complications and disorders of the musculoskeletal system: Secondary | ICD-10-CM | POA: Diagnosis not present

## 2015-08-24 MED ORDER — OXYCODONE-ACETAMINOPHEN 10-325 MG PO TABS: 1.0000 | ORAL_TABLET | Freq: Three times a day (TID) | ORAL | Status: DC | PRN

## 2015-08-24 NOTE — Progress Notes (Signed)
Subjective:  59 year old female presents the office today for follow-up evaluation of right foot pain. She states she had a cancel her surgery due to personal issues. She states that she continues have pain in the top of her right foot on the previous surgical site and she is also aware shoe. She has pain when walking for period of time or standing. No recent injury or trauma. The area swells intermittently. No redness. No other complaints at this time. She denies any systemic complaints such as fevers, chills, nausea, vomiting. No calf pain, chest pain, shortness of breath.  Objective: AAO 3, NAD DP/PT pulses palpable 2/4, CRT less than 3 seconds Incision which is well-healed with a scar around the dorsal medial aspect of the patient's right foot from prior surgery. There is tenderness palpation along this area on the proximal aspect on the area of the cuneiform and there is a palpable exostosis present as well. There is no other area of tenderness bilateral lower extremity is. There is no other open lesions or pre-ulcerative lesions. There is hardware palpable on the medial aspect angle which is been unchanged and does not cause any problems. No pain with calf compression, swelling, warmth, erythema.  Assessment:  59 year old female with continuation of right foot pain  Plan: -Treatment options discussed including all alternatives, risks, and complications -I can discussed both conservative and surgical treatment options with the patient. She wishes to proceed with surgery at this time. I discussed the hardware removal and exostectomy. I discussed with her there is a chance that removing the hardware could lead some instability across the site. She is previously aren't had a revision with nonhealing. The hardware is loose. I'm hopeful that removal of hardware as well as removing the palpable and prominent exostosis will help alleviate some of her symptoms although I discussed with her this is not a  guarantee. She understands this and wishes to proceed with surgery. I again discussed all alternatives risks and complications. She has no further questions at the surgery wishes to proceed. -Surgery was scheduled for 09/08/2015.  Celesta Gentile, DPM

## 2015-08-24 NOTE — Telephone Encounter (Signed)
"  I was scheduled for surgery on October 26.  I had to delay it.  I'd like to reschedule it.  Call me back with an appointment time please."

## 2015-08-24 NOTE — Telephone Encounter (Signed)
Patient came in for an appointment today.  Surgery was scheduled for 09/08/2015.

## 2015-08-25 ENCOUNTER — Telehealth: Payer: Self-pay | Admitting: *Deleted

## 2015-08-25 NOTE — Telephone Encounter (Signed)
I faxed authorization for surgery scheduled for 09/08/2015 for procedures 20680 and [id number] to [facility].  Authorization number is [id number].

## 2015-09-08 DIAGNOSIS — M898X7 Other specified disorders of bone, ankle and foot: Secondary | ICD-10-CM | POA: Diagnosis not present

## 2015-09-08 DIAGNOSIS — M7661 Achilles tendinitis, right leg: Secondary | ICD-10-CM | POA: Diagnosis not present

## 2015-09-08 DIAGNOSIS — M25774 Osteophyte, right foot: Secondary | ICD-10-CM | POA: Diagnosis not present

## 2015-09-08 DIAGNOSIS — Z4889 Encounter for other specified surgical aftercare: Secondary | ICD-10-CM | POA: Diagnosis not present

## 2015-09-08 DIAGNOSIS — Z01818 Encounter for other preprocedural examination: Secondary | ICD-10-CM | POA: Diagnosis not present

## 2015-09-08 DIAGNOSIS — T8484XA Pain due to internal orthopedic prosthetic devices, implants and grafts, initial encounter: Secondary | ICD-10-CM | POA: Diagnosis not present

## 2015-09-17 ENCOUNTER — Encounter: Payer: Self-pay | Admitting: Podiatry

## 2015-09-28 ENCOUNTER — Ambulatory Visit (INDEPENDENT_AMBULATORY_CARE_PROVIDER_SITE_OTHER): Payer: Medicare Other | Admitting: Podiatry

## 2015-09-28 ENCOUNTER — Encounter: Payer: Self-pay | Admitting: Podiatry

## 2015-09-28 DIAGNOSIS — Z9889 Other specified postprocedural states: Secondary | ICD-10-CM | POA: Diagnosis not present

## 2015-09-28 MED ORDER — OXYCODONE-ACETAMINOPHEN 10-325 MG PO TABS: 1.0000 | ORAL_TABLET | Freq: Three times a day (TID) | ORAL | Status: DC | PRN

## 2015-09-28 NOTE — Progress Notes (Signed)
She presents today 2 weeks status post excision of painful internal fixation first metatarsal medial cuneiform joint right foot. Surgery was performed by Dr. Jacqualyn Posey. She denies fever chills nausea vomiting relate significant pain states that she is currently not living at home but is living in a shelter. She states that she has significant pain and continues to ambulate as instructed.  She denies shortness of breath or chest pain.  Objective: dry sterile dressing intact was removed demonstrates sutures are intact along the dorsal aspect of the right foot which were removed today. Margins remain well coapted no signs of infection.    assessment: well-healing surgical foot 2 weeks status post removal of internal fixation.   Plan: placed her in a dressing and back into her [place] Walker. But another prescription for pain medication and will follow up with her in 2 weeks.

## 2015-10-02 ENCOUNTER — Encounter (HOSPITAL_COMMUNITY): Payer: Self-pay | Admitting: Emergency Medicine

## 2015-10-02 ENCOUNTER — Emergency Department (HOSPITAL_COMMUNITY)
Admission: EM | Admit: 2015-10-02 | Discharge: 2015-10-02 | Disposition: A | Discharge status: Home / Self Care | Payer: Medicare Other | Attending: Emergency Medicine | Admitting: Emergency Medicine

## 2015-10-02 DIAGNOSIS — F419 Anxiety disorder, unspecified: Secondary | ICD-10-CM | POA: Diagnosis not present

## 2015-10-02 DIAGNOSIS — Z880 Allergy status to penicillin: Secondary | ICD-10-CM | POA: Insufficient documentation

## 2015-10-02 DIAGNOSIS — F319 Bipolar disorder, unspecified: Secondary | ICD-10-CM | POA: Diagnosis not present

## 2015-10-02 DIAGNOSIS — F17210 Nicotine dependence, cigarettes, uncomplicated: Secondary | ICD-10-CM | POA: Insufficient documentation

## 2015-10-02 DIAGNOSIS — Z91040 Latex allergy status: Secondary | ICD-10-CM | POA: Diagnosis not present

## 2015-10-02 DIAGNOSIS — Z79899 Other long term (current) drug therapy: Secondary | ICD-10-CM | POA: Diagnosis not present

## 2015-10-02 DIAGNOSIS — J0100 Acute maxillary sinusitis, unspecified: Secondary | ICD-10-CM | POA: Diagnosis not present

## 2015-10-02 DIAGNOSIS — J029 Acute pharyngitis, unspecified: Secondary | ICD-10-CM | POA: Diagnosis present

## 2015-10-02 MED ORDER — PSEUDOEPHEDRINE HCL 60 MG PO TABS: 60.0000 mg | ORAL_TABLET | Freq: Four times a day (QID) | ORAL | Status: DC | PRN

## 2015-10-02 MED ORDER — NAPROXEN 500 MG PO TABS: 500.0000 mg | ORAL_TABLET | Freq: Two times a day (BID) | ORAL | Status: DC

## 2015-10-02 MED ORDER — NAPROXEN 250 MG PO TABS
500.0000 mg | ORAL_TABLET | Freq: Once | ORAL | Status: AC
  Administered 2015-10-02: 500 mg via ORAL
  Filled 2015-10-02: qty 2

## 2015-10-02 NOTE — ED Notes (Signed)
Pt. Stated, I've had a cold, congestion, and cough for 2 days. I know I have a sinus infection cause I have green stuff coming up

## 2015-10-02 NOTE — ED Provider Notes (Signed)
CSN: [id number]     Arrival date & time 10/02/15  [id number] History   First MD Initiated Contact with Patient 10/02/15 [phone]     Chief Complaint  Patient presents with  . Cough  . Nasal Congestion  . Facial Pain  . Sore Throat     (Consider location/radiation/quality/duration/timing/severity/associated sxs/prior Treatment) HPI Comments: Patient presents with 2 day history of nasal congestion, sinus pressure, sore throat, nonproductive cough. No fevers, ear pain, vomiting. No treatments prior to arrival. Patient thinks that she has a sinus infection. She has had similar symptoms in the past. She is currently living in a shelter and is around multiple people who may be sick. She has trouble sleeping due to the sinus pressure. Onset of symptoms acute. Course is constant. Nothing makes symptoms better or worse.  Patient is a 59 y.o. female presenting with cough and pharyngitis. The history is provided by the patient.  Cough Associated symptoms: sore throat   Associated symptoms: no chills, no ear pain, no fever, no headaches, no myalgias, no rash, no rhinorrhea, no shortness of breath and no wheezing   Sore Throat Associated symptoms include congestion, coughing and a sore throat. Pertinent negatives include no abdominal pain, chills, fatigue, fever, headaches, myalgias, nausea, rash or vomiting.    Past Medical History  Diagnosis Date  . Bipolar 1 disorder ([place])   . Depression   . Anxiety    Past Surgical History  Procedure Laterality Date  . Elevation of depressed skull fracture    . Abdominal hysterectomy    . Foot arthrodesis      rt and lt  . Fracture surgery  2005    fx both lower legs   . Tooth extraction  11/27/2011    Procedure: DENTAL RESTORATION/EXTRACTIONS;  Surgeon: [name], DDS;  Location: [facility];  Service: Oral Surgery;  Laterality: N/A;  just extractions  . Laparoscopic appendectomy N/A 03/12/2014    Procedure: APPENDECTOMY LAPAROSCOPIC;   Surgeon: [name], MD;  Location: [facility];  Service: General;  Laterality: N/A;   Family History  Problem Relation Age of Onset  . Hyperlipidemia Son    Social History  Substance Use Topics  . Smoking status: Current Every Day Smoker -- 0.25 packs/day for 38 years    Types: Cigarettes  . Smokeless tobacco: None  . Alcohol Use: No   OB History    No data available     Review of Systems  Constitutional: Negative for fever, chills and fatigue.  HENT: Positive for congestion, postnasal drip, sinus pressure and sore throat. Negative for ear pain and rhinorrhea.   Eyes: Negative for redness.  Respiratory: Positive for cough. Negative for shortness of breath and wheezing.   Gastrointestinal: Negative for nausea, vomiting, abdominal pain and diarrhea.  Genitourinary: Negative for dysuria.  Musculoskeletal: Negative for myalgias and neck stiffness.  Skin: Negative for rash.  Neurological: Negative for headaches.  Hematological: Negative for adenopathy.      Allergies  Demerol; Latex; Penicillins; Strawberry extract; and Vicodin  Home Medications   Prior to Admission medications   Medication Sig Start Date End Date Taking? Authorizing Provider  calcium carbonate (TUMS - DOSED IN MG ELEMENTAL CALCIUM) 500 MG chewable tablet Chew 2 tablets by mouth daily.   Yes Historical Provider, MD  cetirizine (ZYRTEC) 10 MG tablet Take 10 mg by mouth daily.   Yes Historical Provider, MD  clobetasol cream (TEMOVATE) [id number] % Apply 1 application topically 2 (two) times daily as needed (  rash).  06/24/15  Yes Historical Provider, MD  ergocalciferol (VITAMIN D2) 50000 UNITS capsule Take 50,000 Units by mouth once a week.   Yes Historical Provider, MD  fluticasone (FLONASE) 50 MCG/ACT nasal spray Place 1 spray into both nostrils as needed. For congestion 09/25/15  Yes Historical Provider, MD  hydrOXYzine (ATARAX/VISTARIL) 25 MG tablet Take 25 mg by mouth every 8 (eight) hours as needed for itching.   07/01/15  Yes Historical Provider, MD  oxyCODONE-acetaminophen (PERCOCET) 10-325 MG tablet Take 1 tablet by mouth every 8 (eight) hours as needed for pain. MAXIMUM TOTAL ACETAMINOPHEN DOSE IS 4000 MG PER DAY 09/28/15  Yes Max T Hyatt, DPM  QUEtiapine (SEROQUEL) 100 MG tablet Take 100 mg by mouth daily.  07/02/15  Yes Historical Provider, MD  sertraline (ZOLOFT) 25 MG tablet Take 25 mg by mouth daily.  07/02/15  Yes Historical Provider, MD  naproxen (NAPROSYN) 500 MG tablet Take 1 tablet (500 mg total) by mouth 2 (two) times daily. 10/02/15   [name], PA-C  pseudoephedrine (SUDAFED) 60 MG tablet Take 1 tablet (60 mg total) by mouth every 6 (six) hours as needed for congestion. 10/02/15   [name], PA-C   BP 120/82 mmHg  Pulse 75  Temp(Src) 98.7 F (37.1 C)  Resp 17  Ht 5' 8.75" (1.746 m)  Wt 84.823 kg  BMI 27.82 kg/m2  SpO2 100%  LMP 03/19/1985 Physical Exam  Constitutional: She appears well-developed and well-nourished.  HENT:  Head: Normocephalic and atraumatic.  Right Ear: Tympanic membrane, external ear and ear canal normal.  Left Ear: Tympanic membrane, external ear and ear canal normal.  Nose: Mucosal edema present. No rhinorrhea. Right sinus exhibits maxillary sinus tenderness. Right sinus exhibits no frontal sinus tenderness. Left sinus exhibits maxillary sinus tenderness. Left sinus exhibits no frontal sinus tenderness.  Mouth/Throat: Uvula is midline and mucous membranes are normal. Mucous membranes are not dry. No oral lesions. No trismus in the jaw. No uvula swelling. Posterior oropharyngeal erythema present. No oropharyngeal exudate, posterior oropharyngeal edema or tonsillar abscesses.  Eyes: Conjunctivae are normal. Right eye exhibits no discharge. Left eye exhibits no discharge.  Neck: Normal range of motion. Neck supple.  Cardiovascular: Normal rate, regular rhythm and normal heart sounds.   Pulmonary/Chest: Effort normal and breath sounds normal. No respiratory  distress. She has no wheezes. She has no rales.  Abdominal: Soft. There is no tenderness.  Lymphadenopathy:    She has no cervical adenopathy.  Neurological: She is alert.  Skin: Skin is warm and dry.  Psychiatric: She has a normal mood and affect.  Nursing note and vitals reviewed.   ED Course  Procedures (including critical care time) Labs Review Labs Reviewed - No data to display  Imaging Review No results found. I have personally reviewed and evaluated these images and lab results as part of my medical decision-making.   EKG Interpretation None       9:54 AM Patient seen and examined. Medications ordered.   Vital signs reviewed and are as follows: BP 120/82 mmHg  Pulse 75  Temp(Src) 98.7 F (37.1 C)  Resp 17  Ht 5' 8.75" (1.746 m)  Wt 84.823 kg  BMI 27.82 kg/m2  SpO2 100%  LMP 03/19/1985  Patient counseled on supportive care for viral URI and s/s to return including worsening symptoms, persistent fever, persistent vomiting, or if they have any other concerns. Urged to see PCP if symptoms persist for more than 3 days. Patient verbalizes understanding and agrees with plan.  MDM   Final diagnoses:  Acute maxillary sinusitis, recurrence not specified   Patient with symptoms consistent with a viral syndrome, specifically sinusitis. Vitals are stable, no fever. No signs of dehydration. Lung exam normal, no signs of pneumonia. Supportive therapy indicated with return if symptoms worsen.      [name], PA-C 10/02/15 [place], MD 10/02/15 [phone]

## 2015-10-02 NOTE — Discharge Instructions (Signed)
Please read and follow all provided instructions.  Your diagnoses today include:  1. Acute maxillary sinusitis, recurrence not specified     You appear to have an upper respiratory infection (URI). An upper respiratory tract infection, or cold, is a viral infection of the air passages leading to the lungs. It should improve gradually after 5-7 days. You may have a lingering cough that lasts for 2- 4 weeks after the infection.  Tests performed today include:  Vital signs. See below for your results today.   Medications prescribed:   Naproxen - anti-inflammatory pain medication  Do not exceed 500mg  naproxen every 12 hours, take with food  You have been prescribed an anti-inflammatory medication or NSAID. Take with food. Take smallest effective dose for the shortest duration needed for your pain. Stop taking if you experience stomach pain or vomiting.    Pseudoephedrine - decongestant medication to help with nasal congestion  Take any prescribed medications only as directed. Treatment for your infection is aimed at treating the symptoms. There are no medications, such as antibiotics, that will cure your infection.   Home care instructions:  Follow any educational materials contained in this packet.   Your illness is contagious and can be spread to others, especially during the first 3 or 4 days. It cannot be cured by antibiotics or other medicines. Take basic precautions such as washing your hands often, covering your mouth when you cough or sneeze, and avoiding public places where you could spread your illness to others.   Please continue drinking plenty of fluids.  Use over-the-counter medicines as needed as directed on packaging for symptom relief.  You may also use ibuprofen or tylenol as directed on packaging for pain or fever.  Do not take multiple medicines containing Tylenol or acetaminophen to avoid taking too much of this medication.  Follow-up instructions: Please follow-up  with your primary care provider in the next 3 days for further evaluation of your symptoms if you are not feeling better.   Return instructions:   Please return to the Emergency Department if you experience worsening symptoms.   RETURN IMMEDIATELY IF you develop shortness of breath, confusion or altered mental status, a new rash, become dizzy, faint, or poorly responsive, or are unable to be cared for at home.  Please return if you have persistent vomiting and cannot keep down fluids or develop a fever that is not controlled by tylenol or motrin.    Please return if you have any other emergent concerns.  Additional Information:  Your vital signs today were: BP 120/82 mmHg   Pulse 75   Temp(Src) 98.7 F (37.1 C)   Resp 17   Ht 5' 8.75" (1.746 m)   Wt 84.823 kg   BMI 27.82 kg/m2   SpO2 100%   LMP 03/19/1985 If your blood pressure (BP) was elevated above 135/85 this visit, please have this repeated by your doctor within one month. --------------

## 2015-10-05 DIAGNOSIS — K219 Gastro-esophageal reflux disease without esophagitis: Secondary | ICD-10-CM | POA: Diagnosis not present

## 2015-10-05 DIAGNOSIS — Z23 Encounter for immunization: Secondary | ICD-10-CM | POA: Diagnosis not present

## 2015-10-05 DIAGNOSIS — N760 Acute vaginitis: Secondary | ICD-10-CM | POA: Diagnosis not present

## 2015-10-05 DIAGNOSIS — J302 Other seasonal allergic rhinitis: Secondary | ICD-10-CM | POA: Diagnosis not present

## 2015-10-14 ENCOUNTER — Encounter: Payer: Self-pay | Admitting: Podiatry

## 2015-10-14 NOTE — Progress Notes (Signed)
DOS 09-08-15  Removal internal fixation right and tarsal exostectomy right   Rx'd Percocet 10/325 #40, Clindamycin 150 mg TID #21, Phenergan 12.5 mg #30

## 2015-10-15 ENCOUNTER — Ambulatory Visit: Payer: Medicare Other

## 2015-10-15 ENCOUNTER — Encounter: Payer: Self-pay | Admitting: Podiatry

## 2015-10-15 ENCOUNTER — Ambulatory Visit (INDEPENDENT_AMBULATORY_CARE_PROVIDER_SITE_OTHER): Payer: Medicare Other | Admitting: Podiatry

## 2015-10-15 VITALS — BP 116/79 | HR 93 | Resp 18

## 2015-10-15 DIAGNOSIS — R262 Difficulty in walking, not elsewhere classified: Secondary | ICD-10-CM

## 2015-10-15 DIAGNOSIS — Z9889 Other specified postprocedural states: Secondary | ICD-10-CM

## 2015-10-15 DIAGNOSIS — M19071 Primary osteoarthritis, right ankle and foot: Secondary | ICD-10-CM

## 2015-10-15 MED ORDER — OXYCODONE-ACETAMINOPHEN 10-325 MG PO TABS: 1.0000 | ORAL_TABLET | Freq: Three times a day (TID) | ORAL | Status: DC | PRN

## 2015-10-18 NOTE — Progress Notes (Signed)
Patient ID: Katrina Kelley, female   DOB: [birth year], 60 y.o.   MRN: [id number]  Subjective: Katrina Kelley is a 60 y.o. is seen today in office s/p right foot hardware removal. They state their pain is continuing  And she points in the midfoot where she has a majority of her pain.  She does continue to take pain medication however intermittently. She has continuing the CAM boot she states that she's been in a cam boot now for almost 2 years. Denies any systemic complaints such as fevers, chills, nausea, vomiting. No calf pain, chest pain, shortness of breath.   She previously underwent a cotton osteotomy with Dr. 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 She subsequently had a revision with a calcaneal bone graft and resection of the nonunion with hardware fixation. She continued to have pain overlying the area and a CT scan was performed which should reveal that there is still no Union however the hardware is loose. She did have pain gently upon the hardware is the hardware is removed at that point.  Objective: General: No acute distress, AAOx3  DP/PT pulses palpable 2/4, CRT < 3 sec to all digits.  Protective sensation intact. Motor function intact.  Right foot: Incision is well coapted without any evidence of dehiscence and a scar has formed. There is no surrounding erythema, ascending cellulitis, fluctuance, crepitus, malodor, drainage/purulence. There is trace edema around the surgical site. There is continued pain along the surgical site.  No other areas of tenderness to bilateral lower extremities.  No other open lesions or pre-ulcerative lesions.  No pain with calf compression, swelling, warmth, erythema.   Assessment and Plan:  Status post Right foot HWR,  With continued pain along the midfoot.  -X-rays were obtained and reviewed with the patient.  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 Her vitamin D levels normal. Recommend follow-up with her primary care  physician physician to rule out other causes a nonunion. -Treatment options discussed including all alternatives, risks, and complications -Ice/elevation -Pain medication as needed. -At this point continue with the cam boot that she can transition to a regular shoe as tolerated. - I will have her follow-up with Betha. Is there a brace or a certain orthotic that would be best for her? She has midfoot arthritic changes, chronic pain, and history of non-union of a medial cuneiform osteotomy.  -Monitor for any clinical signs or symptoms of infection and DVT/PE and directed to call the office immediately should any occur or go to the ER. -Follow-up with me after she sees Betha or sooner if any problems arise. In the meantime, encouraged to call the office with any questions, concerns, change in symptoms.   Celesta Gentile, DPM

## 2015-10-22 ENCOUNTER — Other Ambulatory Visit: Payer: Medicare Other | Admitting: *Deleted

## 2015-10-28 ENCOUNTER — Encounter (HOSPITAL_COMMUNITY): Payer: Self-pay | Admitting: Emergency Medicine

## 2015-10-28 ENCOUNTER — Emergency Department (HOSPITAL_COMMUNITY)
Admission: EM | Admit: 2015-10-28 | Discharge: 2015-10-28 | Disposition: A | Discharge status: Home / Self Care | Payer: Medicare Other | Attending: Emergency Medicine | Admitting: Emergency Medicine

## 2015-10-28 DIAGNOSIS — Z791 Long term (current) use of non-steroidal anti-inflammatories (NSAID): Secondary | ICD-10-CM | POA: Diagnosis not present

## 2015-10-28 DIAGNOSIS — M79662 Pain in left lower leg: Secondary | ICD-10-CM | POA: Diagnosis not present

## 2015-10-28 DIAGNOSIS — F319 Bipolar disorder, unspecified: Secondary | ICD-10-CM | POA: Diagnosis not present

## 2015-10-28 DIAGNOSIS — Z87828 Personal history of other (healed) physical injury and trauma: Secondary | ICD-10-CM | POA: Insufficient documentation

## 2015-10-28 DIAGNOSIS — Z91040 Latex allergy status: Secondary | ICD-10-CM | POA: Diagnosis not present

## 2015-10-28 DIAGNOSIS — G8929 Other chronic pain: Secondary | ICD-10-CM | POA: Diagnosis not present

## 2015-10-28 DIAGNOSIS — F419 Anxiety disorder, unspecified: Secondary | ICD-10-CM | POA: Diagnosis not present

## 2015-10-28 DIAGNOSIS — M79606 Pain in leg, unspecified: Secondary | ICD-10-CM

## 2015-10-28 DIAGNOSIS — M79661 Pain in right lower leg: Secondary | ICD-10-CM | POA: Diagnosis present

## 2015-10-28 DIAGNOSIS — Z880 Allergy status to penicillin: Secondary | ICD-10-CM | POA: Diagnosis not present

## 2015-10-28 DIAGNOSIS — F17210 Nicotine dependence, cigarettes, uncomplicated: Secondary | ICD-10-CM | POA: Diagnosis not present

## 2015-10-28 DIAGNOSIS — Z79899 Other long term (current) drug therapy: Secondary | ICD-10-CM | POA: Diagnosis not present

## 2015-10-28 MED ORDER — TIZANIDINE HCL 4 MG PO TABS: 4.0000 mg | ORAL_TABLET | Freq: Four times a day (QID) | ORAL | Status: DC | PRN

## 2015-10-28 MED ORDER — CYCLOBENZAPRINE HCL 10 MG PO TABS: 10.0000 mg | ORAL_TABLET | Freq: Two times a day (BID) | ORAL | Status: DC | PRN
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
## 2015-10-28 NOTE — ED Notes (Signed)
At time of DC Pt stated" You can just take that Flexeril back because it does nothing for me. You could have looked at my records and seen I don't use that. Don't you have something else for pain. My foot doctor gives me Oxycodone 10 for my pain."

## 2015-10-28 NOTE — ED Notes (Signed)
Pt stable, ambulatory, states understanding of discharge instructions 

## 2015-11-05 ENCOUNTER — Encounter: Payer: Self-pay | Admitting: Podiatry

## 2015-11-05 ENCOUNTER — Ambulatory Visit (INDEPENDENT_AMBULATORY_CARE_PROVIDER_SITE_OTHER): Payer: Medicare Other | Admitting: Podiatry

## 2015-11-05 VITALS — BP 129/85 | HR 78 | Resp 18

## 2015-11-05 DIAGNOSIS — M19071 Primary osteoarthritis, right ankle and foot: Secondary | ICD-10-CM

## 2015-11-05 DIAGNOSIS — Z9889 Other specified postprocedural states: Secondary | ICD-10-CM

## 2015-11-05 MED ORDER — OXYCODONE-ACETAMINOPHEN 10-325 MG PO TABS: 1.0000 | ORAL_TABLET | Freq: Three times a day (TID) | ORAL | Status: DC | PRN

## 2015-11-07 NOTE — Progress Notes (Signed)
Patient ID: Katrina Kelley, female   DOB: [birth year], 60 y.o.   MRN: [id number]  Subjective: Katrina Kelley is a 60 y.o. is seen today in office s/p right foot hardware removal. She does that she continues to have pain to her foot. She also states that she's having pain to her leg. She does have an IM rod in her right leg from a previous injury as well she has an appointment to see orthopedic surgery upcoming. No recent injury or trauma.  Denies any systemic complaints such as fevers, chills, nausea, vomiting. No calf pain, chest pain, shortness of breath.   She previously underwent a cotton osteotomy with Dr. 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 She subsequently had a revision with a calcaneal bone graft and resection of the nonunion with hardware fixation. She continued to have pain overlying the area and a CT scan was performed which should reveal that there is still no Union however the hardware is loose. She did have pain gently upon the hardware is the hardware is removed at that point.  Objective: General: No acute distress, AAOx3  DP/PT pulses palpable 2/4, CRT < 3 sec to all digits.  Protective sensation intact. Motor function intact.  Right foot: Incision is well coapted without any evidence of dehiscence and a scar has formed. There is no surrounding erythema, ascending cellulitis, fluctuance, crepitus, malodor, drainage/purulence. There is trace edema around the surgical site. There is continued pain along the surgical site which worsens as she walks more.  No other areas of tenderness to bilateral lower extremities.  No other open lesions or pre-ulcerative lesions.  No pain with calf compression, swelling, warmth, erythema.   Assessment and Plan:  Status post Right foot HWR,  With continued pain along the midfoot.  -Treatment options discussed including all alternatives, risks, and complications -Ice/elevation -Pain medication as needed. Refilled percocet. Long term if this  goes on she will need to see pain management.  -Continue with surgical shoe. -Awaiting custom brace. -Monitor for any clinical signs or symptoms of infection and DVT/PE and directed to call the office immediately should any occur or go to the ER. -Follow-up with me as scheduled or sooner if any problems arise. In the meantime, encouraged to call the office with any questions, concerns, change in symptoms.   Celesta Gentile, DPM

## 2015-11-08 ENCOUNTER — Ambulatory Visit: Payer: Medicare Other | Admitting: Podiatry

## 2015-11-22 ENCOUNTER — Ambulatory Visit: Payer: Medicare Other | Admitting: *Deleted

## 2015-11-22 DIAGNOSIS — M19071 Primary osteoarthritis, right ankle and foot: Secondary | ICD-10-CM | POA: Diagnosis not present

## 2015-11-22 DIAGNOSIS — R262 Difficulty in walking, not elsewhere classified: Secondary | ICD-10-CM | POA: Diagnosis not present

## 2015-11-22 NOTE — Progress Notes (Signed)
Patient ID: Katrina Kelley, female   DOB: 1955-10-21, 60 y.o.   MRN: [id number] Patient presents for fitting of [place] with [facility] Certified Pedorthist. Written and verbal break in instructions given. Patient will follow up in 6 weeks with Dr.

## 2015-12-03 ENCOUNTER — Ambulatory Visit (INDEPENDENT_AMBULATORY_CARE_PROVIDER_SITE_OTHER): Payer: Medicare Other | Admitting: Podiatry

## 2015-12-03 ENCOUNTER — Encounter: Payer: Self-pay | Admitting: Podiatry

## 2015-12-03 DIAGNOSIS — Z9889 Other specified postprocedural states: Secondary | ICD-10-CM | POA: Diagnosis not present

## 2015-12-03 DIAGNOSIS — S92901K Unspecified fracture of right foot, subsequent encounter for fracture with nonunion: Secondary | ICD-10-CM | POA: Diagnosis not present

## 2015-12-03 DIAGNOSIS — M19071 Primary osteoarthritis, right ankle and foot: Secondary | ICD-10-CM | POA: Diagnosis not present

## 2015-12-03 MED ORDER — OXYCODONE-ACETAMINOPHEN 10-325 MG PO TABS: 1.0000 | ORAL_TABLET | Freq: Three times a day (TID) | ORAL | Status: DC | PRN

## 2015-12-08 NOTE — Progress Notes (Signed)
Patient ID: Katrina Kelley, female   DOB: Dec 11, 1955, 60 y.o.   MRN: [id number]  Subjective: Katrina Kelley is a 60 y.o. is seen today in office s/p right foot hardware removal. She does that she continues to have pain to her foot. She is also having pain to her leg and she has an IM rod in her leg from her previous injury. She is also follow-up with orthopedics since last appointment. Also since last appointment she was fitted for an [place] however she says is really not helping. She is return to the cam boot she is requesting a surgical shoe today. No recent injury or trauma.  Denies any systemic complaints such as fevers, chills, nausea, vomiting. No calf pain, chest pain, shortness of breath.   She previously underwent a cotton osteotomy with Dr. 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 She subsequently had a revision with a calcaneal bone graft and resection of the nonunion with hardware fixation. She continued to have pain overlying the area and a CT scan was performed which should reveal that there is still no union however the hardware is loose. She did have pain gently upon the hardware is the hardware is removed at that point.  Objective: General: No acute distress, AAOx3  DP/PT pulses palpable 2/4, CRT < 3 sec to all digits.  Protective sensation intact. Motor function intact.  Right foot: Incision is well coapted without any evidence of dehiscence and a scar has formed. There is no surrounding erythema, ascending cellulitis, fluctuance, crepitus, malodor, drainage/purulence. There is trace edema around the surgical site but appears to be somewhat improved. There is continued pain along the surgical site which worsens as she walks more.  No other areas of tenderness to bilateral lower extremities.  No other open lesions or pre-ulcerative lesions.  No pain with calf compression, swelling, warmth, erythema.   Assessment and Plan:  Status post Right foot HWR, with continued pain  along the midfoot.  -Treatment options discussed including all alternatives, risks, and complications -Ice/elevation -Pain medication as needed. Refilled percocet. Long term if this goes on she will need to see pain management. She states that she is not receiving pain medicine for many other physician at this time. -Continue with surgical shoe. He needs surgical shoe was dispensed today. -The brace is not fitting correctly not providing left foot support. I will have her see Benjie Karvonen for possible modifications.  -Continue follow-up with orthopedics for the leg as well.  -Monitor for any clinical signs or symptoms of infection and DVT/PE and directed to call the office immediately should any occur or go to the ER. -Follow-up with me as scheduled or sooner if any problems arise. In the meantime, encouraged to call the office with any questions, concerns, change in symptoms.  *x-ray right foot next appointment   Celesta Gentile, DPM

## 2015-12-23 ENCOUNTER — Other Ambulatory Visit: Payer: Medicare Other

## 2015-12-31 ENCOUNTER — Ambulatory Visit (INDEPENDENT_AMBULATORY_CARE_PROVIDER_SITE_OTHER): Payer: Medicare Other | Admitting: Podiatry

## 2015-12-31 ENCOUNTER — Encounter: Payer: Self-pay | Admitting: Podiatry

## 2015-12-31 ENCOUNTER — Ambulatory Visit (INDEPENDENT_AMBULATORY_CARE_PROVIDER_SITE_OTHER): Payer: Medicare Other

## 2015-12-31 VITALS — BP 116/80 | HR 92 | Resp 18

## 2015-12-31 DIAGNOSIS — Z9889 Other specified postprocedural states: Secondary | ICD-10-CM

## 2015-12-31 DIAGNOSIS — M19071 Primary osteoarthritis, right ankle and foot: Secondary | ICD-10-CM | POA: Diagnosis not present

## 2015-12-31 MED ORDER — OXYCODONE-ACETAMINOPHEN 10-325 MG PO TABS: 1.0000 | ORAL_TABLET | Freq: Three times a day (TID) | ORAL | Status: DC | PRN

## 2015-12-31 NOTE — Progress Notes (Signed)
Patient ID: Katrina Kelley, female   DOB: 06-Mar-1956, 60 y.o.   MRN: [id number]  Subjective: Katrina Kelley is a 60 y.o. is seen today in office s/p right foot hardware removal. Successively she states that he continues to have pain to the right foot into her leg. She is apparently seen orthopedics for her right leg. She does continue a surgical shoe which helped her right foot. Also since last appointment her primary is oriented scooter for her which she has not yet received. She also has a caregiver which comes 3 days a week which also helps. No recent injury or trauma.  Denies any systemic complaints such as fevers, chills, nausea, vomiting. No calf pain, chest pain, shortness of breath.   She previously underwent a cotton osteotomy with Dr. 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 She subsequently had a revision with a calcaneal bone graft and resection of the nonunion with hardware fixation. She continued to have pain overlying the area and a CT scan was performed which should reveal that there is still no union however the hardware is loose. She did have pain gently upon the hardware is the hardware is removed at that point.  Objective: General: No acute distress, AAOx3  DP/PT pulses palpable 2/4, CRT < 3 sec to all digits.  Protective sensation intact. Motor function intact.  Right foot: Incision is well coapted without any evidence of dehiscence and a scar has formed. There is no surrounding erythema, ascending cellulitis, fluctuance, crepitus, malodor, drainage/purulence. There is mild edema around the surgical site L a dorsal medial aspect of the foot along the cuneiform of the surgical site. There is continued pain along the surgical site which worsens as she walks more. Exam overall unchanged. No other areas of tenderness to bilateral lower extremities.  No other open lesions or pre-ulcerative lesions.  No pain with calf compression, swelling, warmth, erythema.   Assessment and Plan:   Status post Right foot HWR, with continued pain along the midfoot.  -Treatment options discussed including all alternatives, risks, and complications -X-rays were obtained and reviewed. Changes present within the medial cuneiform from surgery with evidence of nonhealing. -She had to cancel her last appointment for brace modification this is rescheduled for next month. Continue a surgical shoe for now. -Refill Percocet. She is not getting this for any other physician she states. -Ice/elevation -Continue follow-up with orthopedics for the leg as well.  -Monitor for any clinical signs or symptoms of infection and DVT/PE and directed to call the office immediately should any occur or go to the ER. -Follow-up with me as scheduled or sooner if any problems arise. In the meantime, encouraged to call the office with any questions, concerns, change in symptoms.   Celesta Gentile, DPM

## 2016-01-17 ENCOUNTER — Other Ambulatory Visit: Payer: Medicare Other

## 2016-02-04 ENCOUNTER — Ambulatory Visit: Payer: Medicare Other | Admitting: Podiatry

## 2016-02-07 ENCOUNTER — Ambulatory Visit (INDEPENDENT_AMBULATORY_CARE_PROVIDER_SITE_OTHER): Payer: Medicare Other | Admitting: Podiatry

## 2016-02-07 ENCOUNTER — Encounter: Payer: Self-pay | Admitting: Podiatry

## 2016-02-07 DIAGNOSIS — M19071 Primary osteoarthritis, right ankle and foot: Secondary | ICD-10-CM

## 2016-02-07 DIAGNOSIS — M79671 Pain in right foot: Secondary | ICD-10-CM | POA: Diagnosis not present

## 2016-02-07 DIAGNOSIS — M19079 Primary osteoarthritis, unspecified ankle and foot: Secondary | ICD-10-CM | POA: Insufficient documentation

## 2016-02-07 DIAGNOSIS — G8929 Other chronic pain: Secondary | ICD-10-CM | POA: Diagnosis not present

## 2016-02-07 DIAGNOSIS — M79673 Pain in unspecified foot: Secondary | ICD-10-CM

## 2016-02-07 MED ORDER — OXYCODONE-ACETAMINOPHEN 10-325 MG PO TABS: 1.0000 | ORAL_TABLET | Freq: Three times a day (TID) | ORAL | Status: DC | PRN
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
## 2016-03-06 ENCOUNTER — Ambulatory Visit (INDEPENDENT_AMBULATORY_CARE_PROVIDER_SITE_OTHER): Payer: Medicare Other | Admitting: Podiatry

## 2016-03-06 ENCOUNTER — Ambulatory Visit (INDEPENDENT_AMBULATORY_CARE_PROVIDER_SITE_OTHER): Payer: Medicare Other

## 2016-03-06 ENCOUNTER — Encounter: Payer: Self-pay | Admitting: Podiatry

## 2016-03-06 DIAGNOSIS — M19071 Primary osteoarthritis, right ankle and foot: Secondary | ICD-10-CM | POA: Diagnosis not present

## 2016-03-06 DIAGNOSIS — G8929 Other chronic pain: Secondary | ICD-10-CM

## 2016-03-06 DIAGNOSIS — M79671 Pain in right foot: Secondary | ICD-10-CM | POA: Diagnosis not present

## 2016-03-06 DIAGNOSIS — Z9889 Other specified postprocedural states: Secondary | ICD-10-CM | POA: Diagnosis not present

## 2016-03-06 MED ORDER — OXYCODONE-ACETAMINOPHEN 10-325 MG PO TABS: 1.0000 | ORAL_TABLET | Freq: Three times a day (TID) | ORAL | Status: DC | PRN

## 2016-03-06 NOTE — Progress Notes (Signed)
Patient ID: Katrina Kelley, female   DOB: 1956-07-23, 60 y.o.   MRN: [id number]  Subjective: Katrina Kelley is a 60 y.o. is seen today in office for follow up evaluation of right foot pain. She states that she is doing well although when the weather changes she does notice some pain to her foot for which she points the medial aspect of the foot on the surgical sites. She has continue with a surgical shoe which helps. No recent injury. She does take Percocet which helps. She is also order lidocaine patches. No redness or warmth.   She previously underwent a cotton osteotomy with Dr. 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 She subsequently had a revision with a calcaneal bone graft and resection of the nonunion with hardware fixation. She continued to have pain overlying the area and a CT scan was performed which should reveal that there is still no union however the hardware is loose. She did have pain gently upon the hardware is the hardware is removed at that point.  Objective: General: No acute distress, AAOx3  DP/PT pulses palpable 2/4, CRT < 3 sec to all digits.  Protective sensation intact. Motor function intact.  Right foot: Incision is well coapted without any evidence of dehiscence and a scar has formed. There is no surrounding erythema, ascending cellulitis, fluctuance, crepitus, malodor, drainage/purulence. There is mild edema around the surgical site there is mild pain along the medial cuneiform and overall appears to be unchanged compared to prior evaluation. There is no other areas of tenderness bilaterally. No other open lesions or pre-ulcerative lesions.  No pain with calf compression, swelling, warmth, erythema.   Assessment and Plan:  Status post Right foot HWR, with continued pain along the midfoot.  -Treatment options discussed including all alternatives, risks, and complications -X-rays obtained and reviewed today. Arthritic changes the cuneiform with some evidence of  nonhealing. No evidence of acute fracture otherwise. -Refill Percocet. -She has lidocaine patches ordered. -Dispensed a graphite insert to wear inside of her regular shoe. Hopefully she can away from wearing the surgical shoe all the time. She has tried bracing without any relief. -Follow-up 4 weeks or sooner if needed.  Celesta Gentile, DPM

## 2016-03-16 ENCOUNTER — Encounter (HOSPITAL_COMMUNITY): Payer: Self-pay | Admitting: Emergency Medicine

## 2016-03-16 ENCOUNTER — Emergency Department (HOSPITAL_COMMUNITY)
Admission: EM | Admit: 2016-03-16 | Discharge: 2016-03-17 | Disposition: A | Discharge status: Other Acute Inpatient Hospital | Payer: Medicare Other | Attending: Emergency Medicine | Admitting: Emergency Medicine

## 2016-03-16 DIAGNOSIS — Z79899 Other long term (current) drug therapy: Secondary | ICD-10-CM | POA: Insufficient documentation

## 2016-03-16 DIAGNOSIS — F17210 Nicotine dependence, cigarettes, uncomplicated: Secondary | ICD-10-CM | POA: Insufficient documentation

## 2016-03-16 DIAGNOSIS — F1420 Cocaine dependence, uncomplicated: Secondary | ICD-10-CM

## 2016-03-16 DIAGNOSIS — F1410 Cocaine abuse, uncomplicated: Secondary | ICD-10-CM | POA: Diagnosis not present

## 2016-03-16 DIAGNOSIS — Z91040 Latex allergy status: Secondary | ICD-10-CM | POA: Insufficient documentation

## 2016-03-16 DIAGNOSIS — F319 Bipolar disorder, unspecified: Secondary | ICD-10-CM | POA: Diagnosis not present

## 2016-03-16 DIAGNOSIS — F1010 Alcohol abuse, uncomplicated: Secondary | ICD-10-CM | POA: Diagnosis not present

## 2016-03-16 DIAGNOSIS — F1020 Alcohol dependence, uncomplicated: Secondary | ICD-10-CM

## 2016-03-16 DIAGNOSIS — R45851 Suicidal ideations: Secondary | ICD-10-CM | POA: Diagnosis not present

## 2016-03-16 LAB — RAPID URINE DRUG SCREEN, HOSP PERFORMED
AMPHETAMINES: NOT DETECTED
BENZODIAZEPINES: NOT DETECTED
Barbiturates: NOT DETECTED
COCAINE: POSITIVE — AB
OPIATES: NOT DETECTED
Tetrahydrocannabinol: NOT DETECTED

## 2016-03-16 LAB — CBC WITH DIFFERENTIAL/PLATELET
BASOS ABS: 0 10*3/uL (ref 0.0–0.1)
Basophils Relative: 0 %
EOS PCT: 2 %
Eosinophils Absolute: 0.1 10*3/uL (ref 0.0–0.7)
HEMATOCRIT: 40.7 % (ref 36.0–46.0)
HEMOGLOBIN: 13.8 g/dL (ref 12.0–15.0)
LYMPHS ABS: 2.3 10*3/uL (ref 0.7–4.0)
LYMPHS PCT: 29 %
MCH: 30.4 pg (ref 26.0–34.0)
MCHC: 33.9 g/dL (ref 30.0–36.0)
MCV: 89.6 fL (ref 78.0–100.0)
Monocytes Absolute: 0.6 10*3/uL (ref 0.1–1.0)
Monocytes Relative: 7 %
NEUTROS ABS: 4.8 10*3/uL (ref 1.7–7.7)
NEUTROS PCT: 62 %
Platelets: 186 10*3/uL (ref 150–400)
RBC: 4.54 MIL/uL (ref 3.87–5.11)
RDW: 13.4 % (ref 11.5–15.5)
WBC: 7.8 10*3/uL (ref 4.0–10.5)

## 2016-03-16 LAB — COMPREHENSIVE METABOLIC PANEL
ALT: 32 U/L (ref 14–54)
AST: 47 U/L — ABNORMAL HIGH (ref 15–41)
Albumin: 3.8 g/dL (ref 3.5–5.0)
Alkaline Phosphatase: 68 U/L (ref 38–126)
Anion gap: 8 (ref 5–15)
BUN: 13 mg/dL (ref 6–20)
CHLORIDE: 106 mmol/L (ref 101–111)
CO2: 23 mmol/L (ref 22–32)
CREATININE: 0.88 mg/dL (ref 0.44–1.00)
Calcium: 9.2 mg/dL (ref 8.9–10.3)
GFR calc non Af Amer: >60 mL/min (ref >60)
Glucose, Bld: 119 mg/dL — ABNORMAL HIGH (ref 65–99)
POTASSIUM: 3.6 mmol/L (ref 3.5–5.1)
SODIUM: 137 mmol/L (ref 135–145)
Total Bilirubin: 0.8 mg/dL (ref 0.3–1.2)
Total Protein: 8.6 g/dL — ABNORMAL HIGH (ref 6.5–8.1)

## 2016-03-16 LAB — ETHANOL: Alcohol, Ethyl (B): <5 mg/dL (ref <5)

## 2016-03-16 LAB — SALICYLATE LEVEL: SALICYLATE LVL: 5.9 mg/dL (ref 2.8–30.0)

## 2016-03-16 LAB — ACETAMINOPHEN LEVEL: ACETAMINOPHEN (TYLENOL), SERUM: 10 ug/mL (ref 10–30)

## 2016-03-16 LAB — I-STAT TROPONIN, ED: Troponin i, poc: 0 ng/mL (ref 0.00–0.08)

## 2016-03-16 MED ORDER — LORAZEPAM 1 MG PO TABS
0.0000 mg | ORAL_TABLET | Freq: Four times a day (QID) | ORAL | Status: DC
Start: 2016-03-16 — End: 2016-03-17
  Administered 2016-03-16: 1 mg via ORAL
  Filled 2016-03-16 (×2): qty 1

## 2016-03-16 MED ORDER — LORATADINE 10 MG PO TABS
10.0000 mg | ORAL_TABLET | Freq: Every day | ORAL | Status: DC
Start: 2016-03-16 — End: 2016-03-17
  Administered 2016-03-16 – 2016-03-17 (×2): 10 mg via ORAL
  Filled 2016-03-16 (×2): qty 1

## 2016-03-16 MED ORDER — LORAZEPAM 1 MG PO TABS
0.0000 mg | ORAL_TABLET | Freq: Two times a day (BID) | ORAL | Status: DC
  Administered 2016-03-17: 1 mg via ORAL

## 2016-03-16 MED ORDER — SERTRALINE HCL 50 MG PO TABS
25.0000 mg | ORAL_TABLET | Freq: Every day | ORAL | Status: DC
  Administered 2016-03-17: 25 mg via ORAL
  Filled 2016-03-16: qty 1

## 2016-03-16 MED ORDER — NICOTINE 21 MG/24HR TD PT24
21.0000 mg | MEDICATED_PATCH | Freq: Every day | TRANSDERMAL | Status: DC
  Administered 2016-03-16 – 2016-03-17 (×2): 21 mg via TRANSDERMAL
  Filled 2016-03-16 (×2): qty 1

## 2016-03-16 MED ORDER — QUETIAPINE FUMARATE 25 MG PO TABS
100.0000 mg | ORAL_TABLET | Freq: Every day | ORAL | Status: DC
  Administered 2016-03-16 – 2016-03-17 (×2): 100 mg via ORAL
  Filled 2016-03-16 (×3): qty 4

## 2016-03-16 MED ORDER — TIZANIDINE HCL 4 MG PO TABS: 4.0000 mg | ORAL_TABLET | Freq: Four times a day (QID) | ORAL | Status: DC | PRN

## 2016-03-16 MED ORDER — FLUTICASONE PROPIONATE 50 MCG/ACT NA SUSP: 1.0000 | NASAL | Status: DC | PRN

## 2016-03-16 MED ORDER — ZOLPIDEM TARTRATE 5 MG PO TABS: 5.0000 mg | ORAL_TABLET | Freq: Every evening | ORAL | Status: DC | PRN

## 2016-03-16 MED ORDER — HYDROXYZINE HCL 25 MG PO TABS: 25.0000 mg | ORAL_TABLET | Freq: Three times a day (TID) | ORAL | Status: DC | PRN

## 2016-03-16 MED ORDER — IBUPROFEN 200 MG PO TABS
600.0000 mg | ORAL_TABLET | Freq: Three times a day (TID) | ORAL | Status: DC | PRN
  Administered 2016-03-16 – 2016-03-17 (×2): 600 mg via ORAL
  Filled 2016-03-16 (×2): qty 1

## 2016-03-16 MED ORDER — PSEUDOEPHEDRINE HCL 60 MG PO TABS: 60.0000 mg | ORAL_TABLET | Freq: Four times a day (QID) | ORAL | Status: DC | PRN

## 2016-03-16 MED ORDER — CALCIUM CARBONATE ANTACID 500 MG PO CHEW
2.0000 | CHEWABLE_TABLET | Freq: Every day | ORAL | Status: DC
  Administered 2016-03-16 – 2016-03-17 (×2): 400 mg via ORAL
  Filled 2016-03-16 (×3): qty 2

## 2016-03-16 NOTE — ED Provider Notes (Signed)
CSN: [id number]     Arrival date & time 03/16/16  [id number] History   First MD Initiated Contact with Patient 03/16/16 0700     Chief Complaint  Patient presents with  . Suicidal  . Drug Problem     (Consider location/radiation/quality/duration/timing/severity/associated sxs/prior Treatment) HPI  60 year old female with a history of bipolar disorder, depression and anxiety presents asking for help with detox from alcohol and cocaine. She has been abusing alcohol for about 30 years. Has been abusing cocaine for the last 5. Patient states that over the last 1 month she's been on a binge of both. Last used both last night. Drinks large sums of alcohol per day but states that it's "however much I get my hands on". Patient states she's gone through outpatient detox before, most recently 3 months ago, and this "never works". She's been feeling suicidal recently. She states last night she had a handful of pills in her hand and was ready to kill herself. Friend saw her and brought her into the ER instead. Patient states she recently was talking to her counselor who suggested she come up to [facility] for inpatient detox. She's been having mind racing and hearing auditory hallucinations recently as well. She thinks this is because she has not had her bipolar medicines in the last 3 months. Patient currently denies any pain but states that she most recently had chest tightness 2 days ago. She gets chest tightness relatively frequently whenever she goes on cocaine binges.  Past Medical History  Diagnosis Date  . Bipolar 1 disorder ([place])   . Depression   . Anxiety    Past Surgical History  Procedure Laterality Date  . Elevation of depressed skull fracture    . Abdominal hysterectomy    . Foot arthrodesis      rt and lt  . Fracture surgery  2005    fx both lower legs   . Tooth extraction  11/27/2011    Procedure: DENTAL RESTORATION/EXTRACTIONS;  Surgeon: [name], DDS;  Location: [place];  Service: Oral Surgery;  Laterality: N/A;  just extractions  . Laparoscopic appendectomy N/A 03/12/2014    Procedure: APPENDECTOMY LAPAROSCOPIC;  Surgeon: [name], MD;  Location: [place];  Service: General;  Laterality: N/A;   Family History  Problem Relation Age of Onset  . Hyperlipidemia Son    Social History  Substance Use Topics  . Smoking status: Current Every Day Smoker -- 0.25 packs/day for 38 years    Types: Cigarettes  . Smokeless tobacco: None  . Alcohol Use: No   OB History    No data available     Review of Systems  Respiratory: Positive for chest tightness (last time 2 days ago). Negative for shortness of breath.   Psychiatric/Behavioral: Positive for suicidal ideas, hallucinations and dysphoric mood. Negative for self-injury.      Allergies  Demerol; Latex; Penicillins; Strawberry extract; and Vicodin  Home Medications   Prior to Admission medications   Medication Sig Start Date End Date Taking? Authorizing Provider  calcium carbonate (TUMS - DOSED IN MG ELEMENTAL CALCIUM) 500 MG chewable tablet Chew 2 tablets by mouth daily.    Historical Provider, MD  cetirizine (ZYRTEC) 10 MG tablet Take 10 mg by mouth daily.    Historical Provider, MD  clobetasol cream (TEMOVATE) [id number] % Apply 1 application topically 2 (two) times daily as needed (rash).  06/24/15   Historical Provider, MD  ergocalciferol (VITAMIN D2) 50000 UNITS  capsule Take 50,000 Units by mouth once a week.    Historical Provider, MD  fluticasone (FLONASE) 50 MCG/ACT nasal spray Place 1 spray into both nostrils as needed. For congestion 09/25/15   Historical Provider, MD  hydrOXYzine (ATARAX/VISTARIL) 25 MG tablet Take 25 mg by mouth every 8 (eight) hours as needed for itching.  07/01/15   Historical Provider, MD  naproxen (NAPROSYN) 500 MG tablet Take 1 tablet (500 mg total) by mouth 2 (two) times daily. 10/02/15   [name], PA-C  oxyCODONE-acetaminophen (PERCOCET) 10-325 MG tablet Take 1  tablet by mouth every 8 (eight) hours as needed for pain. MAXIMUM TOTAL ACETAMINOPHEN DOSE IS 4000 MG PER DAY 03/06/16   [name], DPM  pseudoephedrine (SUDAFED) 60 MG tablet Take 1 tablet (60 mg total) by mouth every 6 (six) hours as needed for congestion. 10/02/15   [name], PA-C  QUEtiapine (SEROQUEL) 100 MG tablet Take 100 mg by mouth daily.  07/02/15   Historical Provider, MD  sertraline (ZOLOFT) 25 MG tablet Take 25 mg by mouth daily.  07/02/15   Historical Provider, MD  tiZANidine (ZANAFLEX) 4 MG tablet Take 1 tablet (4 mg total) by mouth every 6 (six) hours as needed for muscle spasms. 10/28/15   [name], PA-C   BP 132/104 mmHg  Pulse 92  Temp(Src) 98.2 F (36.8 C) (Oral)  Resp 20  Ht 5\' 8"  (1.727 m)  Wt 195 lb (88.451 kg)  BMI 29.66 kg/m2  SpO2 97%  LMP 03/19/1985 Physical Exam  Constitutional: She is oriented to person, place, and time. She appears well-developed and well-nourished.  HENT:  Head: Normocephalic and atraumatic.  Right Ear: External ear normal.  Left Ear: External ear normal.  Nose: Nose normal.  Eyes: Right eye exhibits no discharge. Left eye exhibits no discharge.  Cardiovascular: Normal rate, regular rhythm and normal heart sounds.   Pulses:      Radial pulses are 2+ on the right side, and 2+ on the left side.  Pulmonary/Chest: Effort normal and breath sounds normal.  Abdominal: Soft. There is no tenderness.  Neurological: She is alert and oriented to person, place, and time.  Skin: Skin is warm and dry.  Psychiatric: She expresses suicidal ideation. She expresses suicidal plans.  Nursing note and vitals reviewed.   ED Course  Procedures (including critical care time) Labs Review Labs Reviewed  COMPREHENSIVE METABOLIC PANEL - Abnormal; Notable for the following:    Glucose, Bld 119 (*)    Total Protein 8.6 (*)    AST 47 (*)    All other components within normal limits  URINE RAPID DRUG SCREEN, HOSP PERFORMED - Abnormal; Notable for  the following:    Cocaine POSITIVE (*)    All other components within normal limits  ETHANOL  CBC WITH DIFFERENTIAL/PLATELET  SALICYLATE LEVEL  ACETAMINOPHEN LEVEL  I-STAT TROPOININ, ED    Imaging Review No results found. I have personally reviewed and evaluated these images and lab results as part of my medical decision-making.   EKG Interpretation   Date/Time:  Thursday March 16 2016 08:32:07 EDT Ventricular Rate:  75 PR Interval:  172 QRS Duration: 82 QT Interval:  432 QTC Calculation: 482 R Axis:   -25 Text Interpretation:  Normal sinus rhythm Septal infarct , age  undetermined Abnormal ECG no significant change since Mar 2014 Confirmed  by Regenia Skeeter MD, [place] [phone]) on 03/16/2016 8:55:10 AM      MDM   Final diagnoses:  Alcohol abuse  Cocaine abuse  Patient appears medically stable. TTS has been consulted and recommends overnight observation and evaluation by psychiatry in the morning. Patient does note that she had chest tightness a couple days ago but none now. Troponin negative. ECG with no significant findings. Patient will be held and under CIWA protocol until psychiatry makes disposition. Care to oncoming ED provider.    Sherwood Gambler, MD 03/16/16 [phone]

## 2016-03-16 NOTE — BH Assessment (Signed)
Per Jeremy Johann, NP - patient will remain in the ED overnight and a final disposition will be made tomorrow when she is reassessed by a NP.

## 2016-03-16 NOTE — ED Notes (Signed)
Patient is aware we need a UA, pt. Stated she will go to toilet after she eats breakfast, the Nurse was informed.
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
## 2016-03-16 NOTE — ED Notes (Signed)
Patient reports that she is having suicidal thoughts, as well as wanting detox for alcohol/cocaine. Reports she had been through outpatient detox program, but ineffective. Reports that she planned to take an overdose of pills to commit suicide.

## 2016-03-16 NOTE — ED Notes (Signed)
Pt now awake and complains of slight sore throat. This RN to give pt Ice water and ibuprofen

## 2016-03-17 ENCOUNTER — Encounter (HOSPITAL_COMMUNITY): Payer: Self-pay

## 2016-03-17 ENCOUNTER — Observation Stay (HOSPITAL_COMMUNITY)
Admission: AD | Admit: 2016-03-17 | Discharge: 2016-03-19 | Disposition: A | Discharge status: Home / Self Care | Payer: Medicare Other | Source: Intra-hospital | Attending: Psychiatry | Admitting: Psychiatry

## 2016-03-17 DIAGNOSIS — M79673 Pain in unspecified foot: Secondary | ICD-10-CM | POA: Insufficient documentation

## 2016-03-17 DIAGNOSIS — F17210 Nicotine dependence, cigarettes, uncomplicated: Secondary | ICD-10-CM | POA: Insufficient documentation

## 2016-03-17 DIAGNOSIS — F419 Anxiety disorder, unspecified: Secondary | ICD-10-CM | POA: Insufficient documentation

## 2016-03-17 DIAGNOSIS — R45851 Suicidal ideations: Secondary | ICD-10-CM | POA: Diagnosis not present

## 2016-03-17 DIAGNOSIS — G8929 Other chronic pain: Secondary | ICD-10-CM | POA: Diagnosis not present

## 2016-03-17 DIAGNOSIS — F319 Bipolar disorder, unspecified: Secondary | ICD-10-CM | POA: Insufficient documentation

## 2016-03-17 DIAGNOSIS — F1994 Other psychoactive substance use, unspecified with psychoactive substance-induced mood disorder: Secondary | ICD-10-CM | POA: Diagnosis present

## 2016-03-17 DIAGNOSIS — Z590 Homelessness: Secondary | ICD-10-CM | POA: Diagnosis not present

## 2016-03-17 DIAGNOSIS — F1420 Cocaine dependence, uncomplicated: Secondary | ICD-10-CM | POA: Diagnosis not present

## 2016-03-17 DIAGNOSIS — F1024 Alcohol dependence with alcohol-induced mood disorder: Secondary | ICD-10-CM | POA: Diagnosis not present

## 2016-03-17 DIAGNOSIS — R12 Heartburn: Secondary | ICD-10-CM | POA: Diagnosis not present

## 2016-03-17 DIAGNOSIS — F1020 Alcohol dependence, uncomplicated: Secondary | ICD-10-CM

## 2016-03-17 DIAGNOSIS — F1424 Cocaine dependence with cocaine-induced mood disorder: Principal | ICD-10-CM | POA: Insufficient documentation

## 2016-03-17 DIAGNOSIS — M19079 Primary osteoarthritis, unspecified ankle and foot: Secondary | ICD-10-CM | POA: Diagnosis not present

## 2016-03-17 DIAGNOSIS — F1010 Alcohol abuse, uncomplicated: Secondary | ICD-10-CM | POA: Diagnosis not present

## 2016-03-17 MED ORDER — ALUM & MAG HYDROXIDE-SIMETH 200-200-20 MG/5ML PO SUSP: 30.0000 mL | ORAL | Status: DC | PRN

## 2016-03-17 MED ORDER — MAGNESIUM HYDROXIDE 400 MG/5ML PO SUSP: 30.0000 mL | Freq: Every day | ORAL | Status: DC | PRN

## 2016-03-17 MED ORDER — QUETIAPINE FUMARATE 100 MG PO TABS
100.0000 mg | ORAL_TABLET | Freq: Every day | ORAL | Status: DC
  Administered 2016-03-17 – 2016-03-18 (×2): 100 mg via ORAL
  Filled 2016-03-17 (×2): qty 1

## 2016-03-17 MED ORDER — TRAZODONE HCL 50 MG PO TABS: 50.0000 mg | ORAL_TABLET | Freq: Every evening | ORAL | Status: DC | PRN

## 2016-03-17 MED ORDER — LORAZEPAM 1 MG PO TABS
1.0000 mg | ORAL_TABLET | Freq: Four times a day (QID) | ORAL | Status: DC | PRN
  Administered 2016-03-17 – 2016-03-18 (×2): 1 mg via ORAL
  Filled 2016-03-17 (×2): qty 1

## 2016-03-17 MED ORDER — SERTRALINE HCL 25 MG PO TABS
25.0000 mg | ORAL_TABLET | Freq: Every day | ORAL | Status: DC
  Administered 2016-03-18: 25 mg via ORAL
  Filled 2016-03-17: qty 1

## 2016-03-17 NOTE — ED Notes (Signed)
[facility] contacted to tx patient to BH 

## 2016-03-17 NOTE — Progress Notes (Signed)
[place] INPATIENT:  Family/Significant Other Suicide Prevention Education  Suicide Prevention Education:  Patient Refusal for Family/Significant Other Suicide Prevention Education: The patient Katrina Kelley has refused to provide written consent for family/significant other to be provided Family/Significant Other Suicide Prevention Education during admission and/or prior to discharge.  Physician notified.  Maureen Chatters [facility] 03/17/2016, 7:16 PM

## 2016-03-17 NOTE — ED Notes (Signed)
TTS being done now

## 2016-03-17 NOTE — Consult Note (Signed)
Telepsych Consultation   Reason for Consult: Substance abuse  Referring Physician:  EDP Patient Identification: MEKAILA TARNOW MRN:  [id number] Principal Diagnosis: Alcohol use disorder, moderate, dependence ([place]) Diagnosis:   Patient Active Problem List   Diagnosis Date Noted  . Alcohol use disorder, moderate, dependence ([place]) [F10.20] 03/17/2016  . Cocaine use disorder, severe, dependence ([place]) [F14.20] 03/17/2016  . Chronic foot pain [M79.673, G89.29] 02/07/2016  . Osteoarthrosis of ankle and foot [M19.079] 02/07/2016  . Appendicitis [K37] 03/12/2014  . Major depression, recurrent, chronic ([place]) [F33.9] 08/20/2012    Total Time spent with patient: 30 minutes  Subjective:   CAMARIE MCTIGUE is a 60 y.o. female patient admitted with suicidal thoughts and request for detox from alcohol/cocaine.   HPI:   Harshitha Fretz is a 60 year old female who presented to [facility] reporting suicidal thoughts and for detox. Patient denies prior residential program treatment only outpatient. She reported plan to overdose on pills to commit suicide due to "being tired of the way I am living." [facility] reports her problems with alcohol and cocaine have been ongoing since she was 60 years of age. She reports drinking around a twelve pack per day and cocaine on a daily basis. Patient reports that she engages in "risky behaviors to survive out there." Patient is currently homeless at this time. She has been to [facility] in the past but reports being off Seroquel for several months. The patient reports that she may "[name] if she goes back to that way of life." Patient reports having withdrawal symptoms of nausea, tremor, diarrhea, and sweats. Her CIWA was an eight this morning. Patient is continuing to endorse active suicidal ideation and a feeling of hopelessness.   Past Psychiatric History: MDD, Alcohol Use Disorder, Cocaine abuse   Risk to Self: Suicidal Ideation: Yes-Currently Present Suicidal  Intent: Yes-Currently Present Is patient at risk for suicide?: Yes Suicidal Plan?: Yes-Currently Present Specify Current Suicidal Plan: Taking a handful of pills.  Access to Means: Yes Specify Access to Suicidal Means: Pain pills or drugs What has been your use of drugs/alcohol within the last 12 months?: None Reported How many times?: 1 Other Self Harm Risks: None Reported Triggers for Past Attempts: Unpredictable Intentional Self Injurious Behavior: None Risk to Others: Homicidal Ideation: No Thoughts of Harm to Others: No Current Homicidal Intent: No Current Homicidal Plan: No Access to Homicidal Means: No Identified Victim: None Reported History of harm to others?: No Assessment of Violence: None Noted Violent Behavior Description: None Reported Does patient have access to weapons?: No Criminal Charges Pending?: No Does patient have a court date: No Prior Inpatient Therapy: Prior Inpatient Therapy: Yes Prior Therapy Dates: 2015 Prior Therapy Facilty/Provider(s): [facility] Reason for Treatment: SA/SI Prior Outpatient Therapy: Prior Outpatient Therapy: Yes Prior Therapy Dates: Ongoing  Prior Therapy Facilty/Provider(s): [facility]  Reason for Treatment: Medication  Does patient have an ACCT team?: No Does patient have Intensive In-House Services?  : No Does patient have [facility] services? : No Does patient have P4CC services?: No  Past Medical History:  Past Medical History  Diagnosis Date  . Bipolar 1 disorder ([place])   . Depression   . Anxiety     Past Surgical History  Procedure Laterality Date  . Elevation of depressed skull fracture    . Abdominal hysterectomy    . Foot arthrodesis      rt and lt  . Fracture surgery  2005    fx both lower legs   . Tooth extraction  11/27/2011    Procedure: DENTAL RESTORATION/EXTRACTIONS;  Surgeon: [name], DDS;  Location: [facility];  Service: Oral Surgery;  Laterality: N/A;  just extractions  . Laparoscopic  appendectomy N/A 03/12/2014    Procedure: APPENDECTOMY LAPAROSCOPIC;  Surgeon: [name], MD;  Location: [facility];  Service: General;  Laterality: N/A;   Family History:  Family History  Problem Relation Age of Onset  . Hyperlipidemia Son    Family Psychiatric  History: Denies Social History:  History  Alcohol Use No     History  Drug Use No    Social History   Social History  . Marital Status: Legally Separated    Spouse Name: N/A  . Number of Children: N/A  . Years of Education: N/A   Social History Main Topics  . Smoking status: Current Every Day Smoker -- 0.25 packs/day for 38 years    Types: Cigarettes  . Smokeless tobacco: None  . Alcohol Use: No  . Drug Use: No  . Sexual Activity: No     Comment: Pt. refused smoking cessation information   Other Topics Concern  . None   Social History Narrative   Additional Social History:    Allergies:   Allergies  Allergen Reactions  . Demerol Swelling    Extremity swelling after iv administration  . Latex Hives and Itching  . Penicillins Nausea And Vomiting, Swelling and Hives  . Strawberry Extract Swelling  . Vicodin [Hydrocodone-Acetaminophen] Other (See Comments)    Pt state she fainted. Per pt tolerates oxycodone Pt state she fainted. Per pt tolerates oxycodone    Labs:  Results for orders placed or performed during the hospital encounter of 03/16/16 (from the past 48 hour(s))  Comprehensive metabolic panel     Status: Abnormal   Collection Time: 03/16/16  7:19 AM  Result Value Ref Range   Sodium 137 135 - 145 mmol/L   Potassium 3.6 3.5 - 5.1 mmol/L   Chloride 106 101 - 111 mmol/L   CO2 23 22 - 32 mmol/L   Glucose, Bld 119 (H) 65 - 99 mg/dL   BUN 13 6 - 20 mg/dL   Creatinine, Ser 0.88 0.44 - 1.00 mg/dL   Calcium 9.2 8.9 - 10.3 mg/dL   Total Protein 8.6 (H) 6.5 - 8.1 g/dL   Albumin 3.8 3.5 - 5.0 g/dL   AST 47 (H) 15 - 41 U/L   ALT 32 14 - 54 U/L   Alkaline Phosphatase 68 38 - 126 U/L   Total  Bilirubin 0.8 0.3 - 1.2 mg/dL   GFR calc non Af Amer >60 >60 mL/min   GFR calc Af Amer >60 >60 mL/min    Comment: (NOTE) The eGFR has been calculated using the CKD EPI equation. This calculation has not been validated in all clinical situations. eGFR's persistently <60 mL/min signify possible Chronic Kidney Disease.    Anion gap 8 5 - 15  Ethanol     Status: None   Collection Time: 03/16/16  7:19 AM  Result Value Ref Range   Alcohol, Ethyl (B) <5 <5 mg/dL    Comment:        LOWEST DETECTABLE LIMIT FOR SERUM ALCOHOL IS 5 mg/dL FOR MEDICAL PURPOSES ONLY   CBC with Differential     Status: None   Collection Time: 03/16/16  7:19 AM  Result Value Ref Range   WBC 7.8 4.0 - 10.5 K/uL   RBC 4.54 3.87 - 5.11 MIL/uL   Hemoglobin 13.8 12.0 -  15.0 g/dL   HCT 40.7 36.0 - 46.0 %   MCV 89.6 78.0 - 100.0 fL   MCH 30.4 26.0 - 34.0 pg   MCHC 33.9 30.0 - 36.0 g/dL   RDW 13.4 11.5 - 15.5 %   Platelets 186 150 - 400 K/uL   Neutrophils Relative % 62 %   Neutro Abs 4.8 1.7 - 7.7 K/uL   Lymphocytes Relative 29 %   Lymphs Abs 2.3 0.7 - 4.0 K/uL   Monocytes Relative 7 %   Monocytes Absolute 0.6 0.1 - 1.0 K/uL   Eosinophils Relative 2 %   Eosinophils Absolute 0.1 0.0 - 0.7 K/uL   Basophils Relative 0 %   Basophils Absolute 0.0 0.0 - 0.1 K/uL  Salicylate level     Status: None   Collection Time: 03/16/16  7:19 AM  Result Value Ref Range   Salicylate Lvl 5.9 2.8 - 30.0 mg/dL  Acetaminophen level     Status: None   Collection Time: 03/16/16  7:19 AM  Result Value Ref Range   Acetaminophen (Tylenol), Serum 10 10 - 30 ug/mL    Comment:        THERAPEUTIC CONCENTRATIONS VARY SIGNIFICANTLY. A RANGE OF 10-30 ug/mL MAY BE AN EFFECTIVE CONCENTRATION FOR MANY PATIENTS. HOWEVER, SOME ARE BEST TREATED AT CONCENTRATIONS OUTSIDE THIS RANGE. ACETAMINOPHEN CONCENTRATIONS >150 ug/mL AT 4 HOURS AFTER INGESTION AND >50 ug/mL AT 12 HOURS AFTER INGESTION ARE OFTEN ASSOCIATED WITH TOXIC REACTIONS.    I-stat troponin, ED     Status: None   Collection Time: 03/16/16  7:22 AM  Result Value Ref Range   Troponin i, poc 0.00 0.00 - 0.08 ng/mL   Comment 3            Comment: Due to the release kinetics of cTnI, a negative result within the first hours of the onset of symptoms does not rule out myocardial infarction with certainty. If myocardial infarction is still suspected, repeat the test at appropriate intervals.   Urine rapid drug screen (hosp performed)     Status: Abnormal   Collection Time: 03/16/16 10:20 AM  Result Value Ref Range   Opiates NONE DETECTED NONE DETECTED   Cocaine POSITIVE (A) NONE DETECTED   Benzodiazepines NONE DETECTED NONE DETECTED   Amphetamines NONE DETECTED NONE DETECTED   Tetrahydrocannabinol NONE DETECTED NONE DETECTED   Barbiturates NONE DETECTED NONE DETECTED    Comment:        DRUG SCREEN FOR MEDICAL PURPOSES ONLY.  IF CONFIRMATION IS NEEDED FOR ANY PURPOSE, NOTIFY LAB WITHIN 5 DAYS.        LOWEST DETECTABLE LIMITS FOR URINE DRUG SCREEN Drug Class       Cutoff (ng/mL) Amphetamine      1000 Barbiturate      200 Benzodiazepine   122 Tricyclics       482 Opiates          300 Cocaine          300 THC              50     Current Facility-Administered Medications  Medication Dose Route Frequency Provider Last Rate Last Dose  . calcium carbonate (TUMS - dosed in mg elemental calcium) chewable tablet 400 mg of elemental calcium  2 tablet Oral Daily Sherwood Gambler, MD   400 mg of elemental calcium at 03/17/16 0956  . fluticasone (FLONASE) 50 MCG/ACT nasal spray 1 spray  1 spray Each Nare PRN Sherwood Gambler, MD      .  hydrOXYzine (ATARAX/VISTARIL) tablet 25 mg  25 mg Oral Q8H PRN Sherwood Gambler, MD      . ibuprofen (ADVIL,MOTRIN) tablet 600 mg  600 mg Oral Q8H PRN Sherwood Gambler, MD   600 mg at 03/17/16 0626  . loratadine (CLARITIN) tablet 10 mg  10 mg Oral Daily Sherwood Gambler, MD   10 mg at 03/17/16 0956  . LORazepam (ATIVAN) tablet 0-4 mg  0-4  mg Oral Q6H Sherwood Gambler, MD   1 mg at 03/16/16 0749   Followed by  . [START ON 03/18/2016] LORazepam (ATIVAN) tablet 0-4 mg  0-4 mg Oral Q12H Sherwood Gambler, MD   1 mg at 03/17/16 0626  . nicotine (NICODERM CQ - dosed in mg/24 hours) patch 21 mg  21 mg Transdermal Daily Sherwood Gambler, MD   21 mg at 03/17/16 0958  . pseudoephedrine (SUDAFED) tablet 60 mg  60 mg Oral Q6H PRN Sherwood Gambler, MD      . QUEtiapine (SEROQUEL) tablet 100 mg  100 mg Oral Daily Sherwood Gambler, MD   100 mg at 03/17/16 0956  . sertraline (ZOLOFT) tablet 25 mg  25 mg Oral Daily Sherwood Gambler, MD   25 mg at 03/17/16 1003  . tiZANidine (ZANAFLEX) tablet 4 mg  4 mg Oral Q6H PRN Sherwood Gambler, MD      . zolpidem (AMBIEN) tablet 5 mg  5 mg Oral QHS PRN Sherwood Gambler, MD       Current Outpatient Prescriptions  Medication Sig Dispense Refill  . cetirizine (ZYRTEC) 10 MG tablet Take 10 mg by mouth daily as needed for allergies.     Marland Kitchen ergocalciferol (VITAMIN D2) 50000 UNITS capsule Take 50,000 Units by mouth once a week.    . fluticasone (FLONASE) 50 MCG/ACT nasal spray Place 1 spray into both nostrils as needed. For congestion    . hydrOXYzine (ATARAX/VISTARIL) 25 MG tablet Take 25 mg by mouth every 8 (eight) hours as needed for itching.     . naproxen (NAPROSYN) 500 MG tablet Take 1 tablet (500 mg total) by mouth 2 (two) times daily. 20 tablet 0  . calcium carbonate (TUMS - DOSED IN MG ELEMENTAL CALCIUM) 500 MG chewable tablet Chew 2 tablets by mouth daily. Reported on 03/16/2016    . clobetasol cream (TEMOVATE) 7.51 % Apply 1 application topically 2 (two) times daily as needed (rash). Reported on 03/16/2016    . oxyCODONE-acetaminophen (PERCOCET) 10-325 MG tablet Take 1 tablet by mouth every 8 (eight) hours as needed for pain. MAXIMUM TOTAL ACETAMINOPHEN DOSE IS 4000 MG PER DAY 30 tablet 0  . pseudoephedrine (SUDAFED) 60 MG tablet Take 1 tablet (60 mg total) by mouth every 6 (six) hours as needed for congestion. 30 tablet 0  .  QUEtiapine (SEROQUEL) 100 MG tablet Take 100 mg by mouth daily.     . sertraline (ZOLOFT) 25 MG tablet Take 25 mg by mouth daily.     Marland Kitchen tiZANidine (ZANAFLEX) 4 MG tablet Take 1 tablet (4 mg total) by mouth every 6 (six) hours as needed for muscle spasms. 30 tablet 0    Musculoskeletal:  Unable to assess via camera   Psychiatric Specialty Exam: Physical Exam  Review of Systems  Constitutional: Positive for malaise/fatigue and diaphoresis.  Gastrointestinal: Positive for diarrhea.  Neurological: Positive for tremors.  Psychiatric/Behavioral: Positive for depression, suicidal ideas and substance abuse. Negative for hallucinations and memory loss. The patient is nervous/anxious and has insomnia.     Blood pressure 103/74, pulse 81, temperature 98.5 F (  36.9 C), temperature source Oral, resp. rate 18, height 5' 8"  (1.727 m), weight 88.451 kg (195 lb), last menstrual period 03/19/1985, SpO2 93 %.Body mass index is 29.66 kg/(m^2).  General Appearance: Casual  Eye Contact:  Fair  Speech:  Clear and Coherent  Volume:  Normal  Mood:  Dysphoric  Affect:  Irritable   Thought Process:  Coherent  Orientation:  Full (Time, Place, and Person)  Thought Content:  Symptoms, worries, concerns   Suicidal Thoughts:  Yes.  with intent/plan  Homicidal Thoughts:  No  Memory:  Immediate;   Good Recent;   Fair Remote;   Fair  Judgement:  Poor  Insight:  Shallow  Psychomotor Activity:  Normal  Concentration:  Concentration: Good and Attention Span: Good  Recall:  [place] of Knowledge:  Good  Language:  Good  Akathisia:  No  Handed:  Right  AIMS (if indicated):     Assets:  Communication Skills Desire for Improvement Leisure Time Resilience  ADL's:  Intact  Cognition:  WNL  Sleep:        Treatment Plan Summary: Daily contact with patient to assess and evaluate symptoms and progress in treatment and Medication management  Disposition: Recommend [facility] admission when  medically cleared for substance abuse treatment.  Supportive therapy provided about ongoing stressors.  Elmarie Shiley, NP 03/17/2016 1:22 PM

## 2016-03-17 NOTE — ED Notes (Signed)
Pain scale added in order to transfer patient.

## 2016-03-17 NOTE — ED Notes (Signed)
[facility] obs called saying patient can come after 5 to bed 1-admitted by dr. Dwyane Dee and report to be called at [phone].

## 2016-03-17 NOTE — Plan of Care (Signed)
[facility] Observation Crisis Plan  Reason for Crisis Plan:  Crisis Stabilization   Plan of Care:  Referral for Substance Abuse  Family Support:      Current Living Environment:  Living Arrangements: Non-relatives/Friends, Other (Comment) (reports stays with friends sometimes)  Insurance:   [place] Account    Name Acct ID Class Status Primary Coverage   Katrina Kelley, Katrina Kelley [id number] [place]        Guarantor Account (for Kelley Account [id number])    Name Relation to Pt Service Area Active? Acct Type   Katrina Kelley Self [facility]   Address Phone       [place], [place] [address] [phone])          Coverage Information (for [place] Account [id number])    1. Katrina Kelley MEDICARE/UHC MEDICARE    F/O Payor/Plan Precert #   Katrina Kelley #   Katrina Kelley, Katrina Kelley [id number]   [place]   [address] [place] [address] [phone]       2. [place]    F/O Payor/Plan Precert #   [place]    Subscriber Subscriber #   Katrina Kelley [id number] [facility]   Address Phone   [address] [place], [place] [address] [phone]          Legal Guardian:     Primary Care Provider:  Philis Fendt, MD  Current Outpatient Providers:  needs  Psychiatrist:     Counselor/Therapist:     Compliant with Medications:  No  Additional Information:   Katrina Kelley 6/2/20176:59 PM

## 2016-03-17 NOTE — ED Notes (Signed)
Patient given Lorazepam 1 mg for CIWA assessment.

## 2016-03-17 NOTE — Progress Notes (Signed)
Patient ID: Katrina Kelley, female   DOB: Jul 19, 1956, 60 y.o.   MRN: [id number]  Patient is a 60 year old female from [facility]. She reports a friend encouraged her to go to the ED due to her increased depression and suicidal thoughts. She also wants some help detoxing from alcohol and cocaine. She reports drinking heavy everyday "as much as I can get: from 18pk of beer to various amounts of liquor. She reports she has a Social worker that she talks to on the phone occasionally that has been supportive. She reports she was thinking about taking pills in an overdose attempt. She reports being off her medications for months and having a lot of racing thoughts and hearing voices. States she has a hx of bipolar depression. Slow when she walks due to past surgery on her rt foot and reports stepping on a nail recently. She reports they assessed at ED but nothing ordered. UDS positive for cocaine but patient states she doesn't want to talk about her hx with cocaine because she is afraid it might affect her getting section 8 housing. Cooperative on admission.

## 2016-03-17 NOTE — ED Notes (Signed)
[facility] called 

## 2016-03-18 DIAGNOSIS — F1424 Cocaine dependence with cocaine-induced mood disorder: Secondary | ICD-10-CM | POA: Diagnosis not present

## 2016-03-18 DIAGNOSIS — F1994 Other psychoactive substance use, unspecified with psychoactive substance-induced mood disorder: Secondary | ICD-10-CM

## 2016-03-18 MED ORDER — SERTRALINE HCL 50 MG PO TABS
50.0000 mg | ORAL_TABLET | Freq: Every day | ORAL | Status: DC
  Administered 2016-03-19: 50 mg via ORAL
  Filled 2016-03-18: qty 1

## 2016-03-18 MED ORDER — HYDROXYZINE HCL 25 MG PO TABS: 25.0000 mg | ORAL_TABLET | Freq: Four times a day (QID) | ORAL | Status: DC | PRN

## 2016-03-18 MED ORDER — IBUPROFEN 600 MG PO TABS
600.0000 mg | ORAL_TABLET | Freq: Four times a day (QID) | ORAL | Status: DC | PRN
  Administered 2016-03-19: 600 mg via ORAL
  Filled 2016-03-18: qty 1

## 2016-03-18 NOTE — Progress Notes (Signed)
Pt alert and cooperative. Presently denies SI/HI/A/V/H. "I am still having bad sweats, dreams and racing thoughts."  Emotional support and encouragement given. Will continue to monitor closely and evaluate for stabilization.

## 2016-03-18 NOTE — [facility] Counselor (Signed)
Pt will be observed overnight per Elmarie Shiley, NP. OBS Counselor will send out referrals to the appropriate facilities to accommodate the pt's MH/SA needs. Pt information sent to [facility].   Redmond Pulling, MA

## 2016-03-18 NOTE — Progress Notes (Signed)
D: Patient sleeping on/off. Continues to endorse depression stated "still not feeling so good. Still depressed". Denies pain, SI, AH/VH at this time. Patient made no complaint of s/s of withdrawals. Patient remains appropriate and cooperative.  A: Staff encouraged patient to continue with the treatment plan and verbalize needs to staff. Constant monitoring for safety and stability.  R: Patient remains safe.

## 2016-03-18 NOTE — H&P (Signed)
[facility] Provider Admission PAA/H&P  Patient Identification: Katrina Kelley MRN:  [id number] Date of Evaluation:  03/18/2016 Chief Complaint:  Patient states "I have been abusing alcohol and cocaine since I was 38."  Principal Diagnosis: Substance induced mood disorder ([place]) Diagnosis:   Patient Active Problem List   Diagnosis Date Noted  . Alcohol use disorder, moderate, dependence ([place]) [F10.20] 03/17/2016  . Cocaine use disorder, severe, dependence ([place]) [F14.20] 03/17/2016  . Substance induced mood disorder ([place]) [F19.94] 03/17/2016  . Chronic foot pain [M79.673, G89.29] 02/07/2016  . Osteoarthrosis of ankle and foot [M19.079] 02/07/2016  . Appendicitis [K37] 03/12/2014  . Major depression, recurrent, chronic ([place]) [F33.9] 08/20/2012   History of Present Illness:   Katrina Kelley is a 60 year old female who presented to [facility] reporting suicidal thoughts and for detox. Patient denies prior residential program treatment only outpatient. She reported plan to overdose on pills to commit suicide due to "being tired of the way I am living." Katrina Kelley reports her problems with alcohol and cocaine have been ongoing since she was 60 years of age. She reports drinking around a twelve pack per day and cocaine on a daily basis. Patient reports that she engages in "risky behaviors to survive out there." Patient is currently homeless at this time. She has been to [facility] in the past but reports being off Seroquel for several months. The patient reports that she may "[name] if she goes back to that way of life." Patient reports having withdrawal symptoms of nausea, tremor, diarrhea, and sweats. Her CIWA was a four this morning. Patient was  Endorsing active suicidal ideation with a plan to overdose on admission and a feeling of hopelessness. She expresses an interest in being accepted to a residential treatment program. Patient reported hearing voices this morning of people talking but  that it got better once "she got some sleep and my seroquel last night." The patient did not appear to be responding to internal stimuli during the assessment but reports a past history of psychotic symptoms. On admission her urine drug screen was positive for cocaine with a negative alcohol level.   Associated Signs/Symptoms: Depression Symptoms:  depressed mood, anhedonia, insomnia, fatigue, difficulty concentrating, hopelessness, recurrent thoughts of death, suicidal thoughts with specific plan, anxiety, loss of energy/fatigue, disturbed sleep, (Hypo) Manic Symptoms:  Denies Anxiety Symptoms:  Excessive Worry, Psychotic Symptoms:  Hallucinations: Auditory PTSD Symptoms: Negative Total Time spent with patient: 45 minutes  Past Psychiatric History: Major Depressive Disorder, Alcohol Dependence, Cocaine abuse   Is the patient at risk to self? Yes.    Has the patient been a risk to self in the past 6 months? Yes.    Has the patient been a risk to self within the distant past? Yes.    Is the patient a risk to others? No.  Has the patient been a risk to others in the past 6 months? No.  Has the patient been a risk to others within the distant past? No.   Prior Inpatient Therapy:   Prior Outpatient Therapy:    Alcohol Screening: 1. How often do you have a drink containing alcohol?: 4 or more times a week 2. How many drinks containing alcohol do you have on a typical day when you are drinking?: 10 or more 3. How often do you have six or more drinks on one occasion?: Daily or almost daily Preliminary Score: 8 4. How often during the last year have you found that you were not  able to stop drinking once you had started?: Daily or almost daily 5. How often during the last year have you failed to do what was normally expected from you becasue of drinking?: Weekly 6. How often during the last year have you needed a first drink in the morning to get yourself going after a heavy drinking  session?: Weekly 7. How often during the last year have you had a feeling of guilt of remorse after drinking?: Weekly 8. How often during the last year have you been unable to remember what happened the night before because you had been drinking?: Weekly 9. Have you or someone else been injured as a result of your drinking?: No 10. Has a relative or friend or a doctor or another health worker been concerned about your drinking or suggested you cut down?: Yes, during the last year Alcohol Use Disorder Identification Test Final Score (AUDIT): 32 Brief Intervention: Yes Substance Abuse History in the last 12 months:  Yes.   Consequences of Substance Abuse: Withdrawal Symptoms:   Diaphoresis Headaches Nausea Tremors Reports hearing voices can be exacerbated by cocaine abuse  Previous Psychotropic Medications: Yes  Psychological Evaluations: No  Past Medical History:  Past Medical History  Diagnosis Date  . Bipolar 1 disorder ([place])   . Depression   . Anxiety     Past Surgical History  Procedure Laterality Date  . Elevation of depressed skull fracture    . Abdominal hysterectomy    . Foot arthrodesis      rt and lt  . Fracture surgery  2005    fx both lower legs   . Tooth extraction  11/27/2011    Procedure: DENTAL RESTORATION/EXTRACTIONS;  Surgeon: [name], DDS;  Location: [facility];  Service: Oral Surgery;  Laterality: N/A;  just extractions  . Laparoscopic appendectomy N/A 03/12/2014    Procedure: APPENDECTOMY LAPAROSCOPIC;  Surgeon: [name], MD;  Location: [facility];  Service: General;  Laterality: N/A;   Family History:  Family History  Problem Relation Age of Onset  . Hyperlipidemia Son    Family Psychiatric History:  Tobacco Screening: Yes patient has a 9.5 pack-year smoking history.  Social History:  History  Alcohol Use  . Yes    Comment: 18 beers and however much alcohol she can get     History  Drug Use  . Yes  . Special: Cocaine     Additional Social History:      History of alcohol / drug use?: Yes Negative Consequences of Use: Personal relationships, Financial                    Allergies:   Allergies  Allergen Reactions  . Demerol Swelling    Extremity swelling after iv administration  . Latex Hives and Itching  . Penicillins Nausea And Vomiting, Swelling and Hives  . Strawberry Extract Swelling  . Vicodin [Hydrocodone-Acetaminophen] Other (See Comments)    Pt state she fainted. Per pt tolerates oxycodone Pt state she fainted. Per pt tolerates oxycodone   Lab Results: No results found for this or any previous visit (from the past 48 hour(s)).  Blood Alcohol level:  Lab Results  Component Value Date   [facility] <5 03/16/2016   ETH <11 [id number]    Metabolic Disorder Labs:  Lab Results  Component Value Date   HGBA1C  12/25/2008    5.4 (NOTE)   The ADA recommends the following therapeutic goal for glycemic   control related  to Hgb A1C measurement:   Goal of Therapy:   < 7.0% Hgb A1C   Reference: American Diabetes Association: Clinical Practice   Recommendations 2008, Diabetes Care,  2008, 31:(Suppl 1).   MPG 108 12/25/2008   MPG 114 11/01/2008   No results found for: PROLACTIN Lab Results  Component Value Date   CHOL  12/25/2008    52        ATP III CLASSIFICATION:  <200     mg/dL   Desirable  200-239  mg/dL   Borderline High  >=240    mg/dL   High          TRIG 84 12/25/2008   HDL <10 REPEATED TO VERIFY* 12/25/2008   CHOLHDL NOT CALCULATED 12/25/2008   VLDL 17 12/25/2008   LDLCALC NOT CALCULATED 12/25/2008   LDLCALC  11/01/2008    28        Total Cholesterol/HDL:CHD Risk Coronary Heart Disease Risk Table                     Men   Women  1/2 Average Risk   3.4   3.3  Average Risk       5.0   4.4  2 X Average Risk   9.6   7.1  3 X Average Risk  23.4   11.0        Use the calculated Patient Ratio above and the CHD Risk Table to determine the patient's CHD Risk.        ATP III  CLASSIFICATION (LDL):  <100     mg/dL   Optimal  100-129  mg/dL   Near or Above                    Optimal  130-159  mg/dL   Borderline  160-189  mg/dL   High  >190     mg/dL   Very High    Current Medications: Current Facility-Administered Medications  Medication Dose Route Frequency Provider Last Rate Last Dose  . alum & mag hydroxide-simeth (MAALOX/MYLANTA) 200-200-20 MG/5ML suspension 30 mL  30 mL Oral Q4H PRN [name], NP      . hydrOXYzine (ATARAX/VISTARIL) tablet 25 mg  25 mg Oral Q6H PRN [name], NP      . ibuprofen (ADVIL,MOTRIN) tablet 600 mg  600 mg Oral Q6H PRN [name], NP      . LORazepam (ATIVAN) tablet 1 mg  1 mg Oral Q6H PRN [name], NP   1 mg at 03/18/16 0801  . magnesium hydroxide (MILK OF MAGNESIA) suspension 30 mL  30 mL Oral Daily PRN [name], NP      . QUEtiapine (SEROQUEL) tablet 100 mg  100 mg Oral QHS [name], NP   100 mg at 03/17/16 2102  . [START ON 03/19/2016] sertraline (ZOLOFT) tablet 50 mg  50 mg Oral Daily [name], NP      . traZODone (DESYREL) tablet 50 mg  50 mg Oral QHS PRN [name], NP       PTA Medications: Prescriptions prior to admission  Medication Sig Dispense Refill Last Dose  . calcium carbonate (TUMS - DOSED IN MG ELEMENTAL CALCIUM) 500 MG chewable tablet Chew 2 tablets by mouth as needed for indigestion or heartburn. Reported on 03/16/2016   Past Month at Unknown time  . cetirizine (ZYRTEC) 10 MG tablet Take 10 mg by mouth daily as needed for allergies.  Past Month at Unknown time  . fluticasone (FLONASE) 50 MCG/ACT nasal spray Place 1 spray into both nostrils as needed. For congestion   Past Month at Unknown time  . hydrOXYzine (ATARAX/VISTARIL) 25 MG tablet Take 25 mg by mouth every 8 (eight) hours as needed for itching.    Past Month at Unknown time  . naproxen (NAPROSYN) 500 MG tablet Take 1 tablet (500 mg total) by mouth 2 (two) times daily. (Patient taking differently: Take 500 mg by mouth 2  (two) times daily as needed for mild pain or moderate pain. ) 20 tablet 0 Past Month at Unknown time  . pseudoephedrine (SUDAFED) 60 MG tablet Take 1 tablet (60 mg total) by mouth every 6 (six) hours as needed for congestion. 30 tablet 0 Past Month at Unknown time  . QUEtiapine (SEROQUEL) 100 MG tablet Take 100 mg by mouth daily.    Past Month at Unknown time  . sertraline (ZOLOFT) 25 MG tablet Take 25 mg by mouth daily.    Past Month at Unknown time  . tiZANidine (ZANAFLEX) 4 MG tablet Take 1 tablet (4 mg total) by mouth every 6 (six) hours as needed for muscle spasms. 30 tablet 0 Past Month at Unknown time  . ergocalciferol (VITAMIN D2) 50000 UNITS capsule Take 50,000 Units by mouth once a week. monday   03/13/2016    Musculoskeletal: Strength & Muscle Tone: within normal limits Gait & Station: normal Patient leans: N/A  Psychiatric Specialty Exam: Physical Exam  Review of Systems  Constitutional: Positive for diaphoresis.  HENT: Negative.   Eyes: Negative.   Respiratory: Negative.   Cardiovascular: Negative.   Gastrointestinal: Negative.   Genitourinary: Negative.   Musculoskeletal: Positive for joint pain.  Skin: Negative.   Neurological: Negative.   Endo/Heme/Allergies: Negative.   Psychiatric/Behavioral: Positive for depression, suicidal ideas, hallucinations and substance abuse. Negative for memory loss. The patient is nervous/anxious and has insomnia.     Blood pressure 111/83, pulse 96, temperature 98.6 F (37 C), temperature source Oral, resp. rate 16, height 5\' 8"  (1.727 m), weight 89.812 kg (198 lb), last menstrual period 03/19/1985, SpO2 96 %.Body mass index is 30.11 kg/(m^2).  General Appearance: Casual  Eye Contact:  Fair  Speech:  Clear and Coherent and Slow  Volume:  Decreased  Mood:  Dysphoric and Irritable  Affect:  Congruent  Thought Process:  Coherent  Orientation:  Full (Time, Place, and Person)  Thought Content:  Hallucinations: Auditory  Suicidal  Thoughts:  No  Homicidal Thoughts:  No  Memory:  Immediate;   Good Recent;   Fair Remote;   Fair  Judgement:  Impaired  Insight:  Shallow  Psychomotor Activity:  Decreased  Concentration:  Concentration: Fair and Attention Span: Fair  Recall:  [place] of Knowledge:  Good  Language:  Good  Akathisia:  No  Handed:  Right  AIMS (if indicated):     Assets:  Communication Skills Desire for Improvement Leisure Time Physical Health Resilience  ADL's:  Intact  Cognition:  WNL  Sleep:         Treatment Plan Summary: Daily contact with patient to assess and evaluate symptoms and progress in treatment and Medication management  Observation Level/Precautions:  Continuous Observation Laboratory:  CBC Chemistry Profile UDS Psychotherapy:  Individual  Medications:  Increase Zoloft to 50 mg daily for depressive symptoms, Continue Seroquel 100 mg at hs for mood control/psychosis, Ativan 1 mg as needed for symptoms of alcohol withdrawal  Consultations:  As needed Discharge  Concerns:  Maintaining sobriety and housing  Estimated LOS: 24-48 hours Other:  Information has been sent to [facility] for substance abuse treatment   DAVIS, Mickel Baas, NP 6/3/201711:38 AM  I reviewed chart and agreed with the findings and treatment Plan.  Berniece Andreas, MD

## 2016-03-18 NOTE — Progress Notes (Signed)
D: Patient seen lying down with eyes closed during the shift change. Responded as soon as this Probation officer greeted her. Patient complained of tiredness and depression. Denied pain, SI, AH/VH at this time. Patient reported hot flashes. Noted sweats on patient's forehead. CIWA 8. Patient cooperative and appropriate.  A: Staff offered support and encouragement. Due/prn meds given as ordered. Every 15 minutes check for safety maintained. Will continue to  monitor patient for safety and stability. R: Patient remains safe.

## 2016-03-19 DIAGNOSIS — F1994 Other psychoactive substance use, unspecified with psychoactive substance-induced mood disorder: Secondary | ICD-10-CM | POA: Diagnosis not present

## 2016-03-19 DIAGNOSIS — F1424 Cocaine dependence with cocaine-induced mood disorder: Secondary | ICD-10-CM | POA: Diagnosis not present

## 2016-03-19 MED ORDER — SERTRALINE HCL 50 MG PO TABS: 50.0000 mg | ORAL_TABLET | Freq: Every day | ORAL | Status: DC

## 2016-03-19 MED ORDER — TRAZODONE HCL 50 MG PO TABS: 50.0000 mg | ORAL_TABLET | Freq: Every evening | ORAL | Status: DC | PRN

## 2016-03-19 MED ORDER — QUETIAPINE FUMARATE 100 MG PO TABS: 100.0000 mg | ORAL_TABLET | Freq: Every day | ORAL | Status: DC

## 2016-03-19 MED ORDER — OXYCODONE-ACETAMINOPHEN 10-325 MG PO TABS: 1.0000 | ORAL_TABLET | Freq: Three times a day (TID) | ORAL | Status: DC | PRN

## 2016-03-19 NOTE — Progress Notes (Signed)
Pt alert and cooperative. Presently denies SI/HI/A/V/H. "I feel much better." Report bilateral foot pain, see MAR. Emotional support and encouragement given. Will continue to monitor closely and evaluate for stabilization.

## 2016-03-19 NOTE — [facility] Counselor (Signed)
Pt provided with housing and SA information. Pt refused resources. Pt wanted a bus pass. Bus pass provided to the Pt.  Lorenza [place], [facility] Triage Specialist

## 2016-03-19 NOTE — Progress Notes (Signed)
Pt discharged home via friend transportation. Pt refused to wait for AVS (discharge papers to print due to computer issue). Mickel Baas NP notified and [facility]. Pt denies pain or physical discomfort. Denies SI/HI/A/V/H. All belongings returned, discharge instructions reviewed verbally, scripts given. Pt verbalized understanding.

## 2016-03-19 NOTE — Discharge Summary (Signed)
[facility] Discharge Summary Note  Patient:  Katrina Kelley is an 60 y.o., female MRN:  [id number] DOB:  07/14/1956 Patient phone:  [phone] (home)  Patient address:   [place] [address],  Total Time spent with patient: 30 minutes  Date of Admission:  03/17/2016 Date of Discharge: 03/19/2016  Reason for Admission:  Alcohol/Cocaine abuse   Principal Problem: Substance induced mood disorder [facility]) Discharge Diagnoses: Patient Active Problem List   Diagnosis Date Noted  . Alcohol use disorder, moderate, dependence ([place]) [F10.20] 03/17/2016  . Cocaine use disorder, severe, dependence ([place]) [F14.20] 03/17/2016  . Substance induced mood disorder ([place]) [F19.94] 03/17/2016  . Chronic foot pain [M79.673, G89.29] 02/07/2016  . Osteoarthrosis of ankle and foot [M19.079] 02/07/2016  . Appendicitis [K37] 03/12/2014  . Major depression, recurrent, chronic ([place]) [F33.9] 08/20/2012    Past Psychiatric History: Polysubstance abuse, MDD  Past Medical History:  Past Medical History  Diagnosis Date  . Bipolar 1 disorder ([place])   . Depression   . Anxiety     Past Surgical History  Procedure Laterality Date  . Elevation of depressed skull fracture    . Abdominal hysterectomy    . Foot arthrodesis      rt and lt  . Fracture surgery  2005    fx both lower legs   . Tooth extraction  11/27/2011    Procedure: DENTAL RESTORATION/EXTRACTIONS;  Surgeon: [name], DDS;  Location: [facility];  Service: Oral Surgery;  Laterality: N/A;  just extractions  . Laparoscopic appendectomy N/A 03/12/2014    Procedure: APPENDECTOMY LAPAROSCOPIC;  Surgeon: [name], MD;  Location: [facility];  Service: General;  Laterality: N/A;   Family History:  Family History  Problem Relation Age of Onset  . Hyperlipidemia Son    Family Psychiatric  History: See H & P Social History:  History  Alcohol Use  . Yes    Comment: 18 beers and however much alcohol she  can get     History  Drug Use  . Yes  . Special: Cocaine    Social History   Social History  . Marital Status: Legally Separated    Spouse Name: N/A  . Number of Children: N/A  . Years of Education: N/A   Social History Main Topics  . Smoking status: Current Every Day Smoker -- 0.25 packs/day for 38 years    Types: Cigarettes  . Smokeless tobacco: None  . Alcohol Use: Yes     Comment: 18 beers and however much alcohol she can get  . Drug Use: Yes    Special: Cocaine  . Sexual Activity: No     Comment: Pt. refused smoking cessation information   Other Topics Concern  . None   Social History Narrative    Hospital Course:    Katrina Kelley is a 60 year old female who presented to [facility] reporting suicidal thoughts and for detox. Patient denies prior residential program treatment only outpatient. She reported plan to overdose on pills to commit suicide due to "being tired of the way I am living." [name] reports her problems with alcohol and cocaine have been ongoing since she was 60 years of age. She reports drinking around a twelve pack per day and cocaine on a daily basis. Patient reports that she engages in "risky behaviors to survive out there." Patient is currently homeless at this time. She has been to [facility] in the past but reports being off Seroquel for several  months. The patient reports that she may "[name] if she goes back to that way of life." Patient reports having withdrawal symptoms of nausea, tremor, diarrhea, and sweats. Her CIWA was a four this morning. Patient was Endorsing active suicidal ideation with a plan to overdose on admission and a feeling of hopelessness. She expresses an interest in being accepted to a residential treatment program. Patient reported hearing voices this morning of people talking but that it got better once "she got some sleep and my seroquel last night." The patient did not appear to be responding to internal stimuli during the  assessment but reports a past history of psychotic symptoms. On admission her urine drug screen was positive for cocaine with a negative alcohol level.   Patient was monitored in the [facility] and restarted on her psychotropic medications. Her zoloft was increased to 50 mg daily for reports of severe depressive symptoms. Patient requested ativan on a prn basis to help with any symptoms of alcohol withdrawal. She reported hearing voices on on 03/17/2016 but reported this resolved after being started on seroquel. Patient requested outpatient resources to help address her substance abuse issues. She also requested help finding housing at a local shelter. By the day of discharge the patient reported being in much improved condition than on admission. She denied any suicidal/homicidal ideation or psychotic symptoms. Patient's affect was much more reactive than on admission and patient was pleasant during the discharge assessment. The patient was provided with prescriptions for her psychiatric medications. Patient was discharged home via friend transportation. Patient refused to wait for AVS (discharge papers to print due to computer issue).   Physical Findings: AIMS: Facial and Oral Movements Muscles of Facial Expression: None, normal Lips and Perioral Area: None, normal Jaw: None, normal Tongue: None, normal,Extremity Movements Upper (arms, wrists, hands, fingers): None, normal Lower (legs, knees, ankles, toes): None, normal, Trunk Movements Neck, shoulders, hips: None, normal, Overall Severity Severity of abnormal movements (highest score from questions above): None, normal Incapacitation due to abnormal movements: None, normal Patient's awareness of abnormal movements (rate only patient's report): No Awareness, Dental Status Current problems with teeth and/or dentures?: No Does patient usually wear dentures?: No  CIWA:  CIWA-Ar Total: 1 COWS:     Musculoskeletal: Strength & Muscle Tone:  within normal limits Gait & Station: normal Patient leans: N/A  Psychiatric Specialty Exam: Physical Exam  ROS  Blood pressure 118/82, pulse 79, temperature 98.9 F (37.2 C), temperature source Oral, resp. rate 16, height 5\' 8"  (1.727 m), weight 89.812 kg (198 lb), last menstrual period 03/19/1985, SpO2 100 %.Body mass index is 30.11 kg/(m^2).  General Appearance: Casual  Eye Contact:  Good  Speech:  Clear and Coherent  Volume:  Normal  Mood:  Euthymic  Affect:  Appropriate  Thought Process:  Coherent and Goal Directed  Orientation:  Full (Time, Place, and Person)  Thought Content:  WDL  Suicidal Thoughts:  No  Homicidal Thoughts:  No  Memory:  Immediate;   Good Recent;   Good Remote;   Good  Judgement:  Impaired  Insight:  Present  Psychomotor Activity:  Normal  Concentration:  Concentration: Good and Attention Span: Good  Recall:  [place] of Knowledge:  Good  Language:  Good  Akathisia:  No  Handed:  Right  AIMS (if indicated):     Assets:  Communication Skills Desire for Improvement Leisure Time Physical Health Resilience Social Support  ADL's:  Intact  Cognition:  WNL  Sleep:  Have you used any form of tobacco in the last 30 days? (Cigarettes, Smokeless Tobacco, Cigars, and/or Pipes): Yes  Has this patient used any form of tobacco in the last 30 days? (Cigarettes, Smokeless Tobacco, Cigars, and/or Pipes) Yes, Yes, A prescription for an FDA-approved tobacco cessation medication was offered at discharge and the patient refused  Blood Alcohol level:  Lab Results  Component Value Date   [facility] <5 03/16/2016   ETH <11 [id number]    Metabolic Disorder Labs:  Lab Results  Component Value Date   HGBA1C  12/25/2008    5.4 (NOTE)   The ADA recommends the following therapeutic goal for glycemic   control related to Hgb A1C measurement:   Goal of Therapy:   < 7.0% Hgb A1C   Reference: American Diabetes Association: Clinical Practice   Recommendations 2008,  Diabetes Care,  2008, 31:(Suppl 1).   MPG 108 12/25/2008   MPG 114 11/01/2008   No results found for: PROLACTIN Lab Results  Component Value Date   CHOL  12/25/2008    52        ATP III CLASSIFICATION:  <200     mg/dL   Desirable  200-239  mg/dL   Borderline High  >=240    mg/dL   High          TRIG 84 12/25/2008   HDL <10 REPEATED TO VERIFY* 12/25/2008   CHOLHDL NOT CALCULATED 12/25/2008   VLDL 17 12/25/2008   LDLCALC NOT CALCULATED 12/25/2008   LDLCALC  11/01/2008    28        Total Cholesterol/HDL:CHD Risk Coronary Heart Disease Risk Table                     Men   Women  1/2 Average Risk   3.4   3.3  Average Risk       5.0   4.4  2 X Average Risk   9.6   7.1  3 X Average Risk  23.4   11.0        Use the calculated Patient Ratio above and the CHD Risk Table to determine the patient's CHD Risk.        ATP III CLASSIFICATION (LDL):  <100     mg/dL   Optimal  100-129  mg/dL   Near or Above                    Optimal  130-159  mg/dL   Borderline  160-189  mg/dL   High  >190     mg/dL   Very High    Discharge destination:  Home  Is patient on multiple antipsychotic therapies at discharge:  No   Has Patient had three or more failed trials of antipsychotic monotherapy by history:  No  Recommended Plan for Multiple Antipsychotic Therapies: NA      Discharge Instructions    Discharge instructions    Complete by:  As directed   Please follow up with your Primary Provider for any medical concerns.            Medication List    STOP taking these medications        clobetasol cream 0.05 %  Commonly known as:  TEMOVATE      TAKE these medications      Indication   calcium carbonate 500 MG chewable tablet  Commonly known as:  TUMS - dosed in mg elemental calcium  Chew 2 tablets by mouth  as needed for indigestion or heartburn. Reported on 03/16/2016      cetirizine 10 MG tablet  Commonly known as:  ZYRTEC  Take 10 mg by mouth daily as needed for  allergies.      ergocalciferol 50000 units capsule  Commonly known as:  VITAMIN D2  Take 50,000 Units by mouth once a week. monday      fluticasone 50 MCG/ACT nasal spray  Commonly known as:  FLONASE  Place 1 spray into both nostrils as needed. For congestion      hydrOXYzine 25 MG tablet  Commonly known as:  ATARAX/VISTARIL  Take 25 mg by mouth every 8 (eight) hours as needed for itching.      naproxen 500 MG tablet  Commonly known as:  NAPROSYN  Take 1 tablet (500 mg total) by mouth 2 (two) times daily.      oxyCODONE-acetaminophen 10-325 MG tablet  Commonly known as:  PERCOCET  Take 1 tablet by mouth every 8 (eight) hours as needed for pain. MAXIMUM TOTAL ACETAMINOPHEN DOSE IS 4000 MG PER DAY      pseudoephedrine 60 MG tablet  Commonly known as:  SUDAFED  Take 1 tablet (60 mg total) by mouth every 6 (six) hours as needed for congestion.      QUEtiapine 100 MG tablet  Commonly known as:  SEROQUEL  Take 100 mg by mouth daily.      QUEtiapine 100 MG tablet  Commonly known as:  SEROQUEL  Take 1 tablet (100 mg total) by mouth at bedtime.   Indication:  Mood control     sertraline 25 MG tablet  Commonly known as:  ZOLOFT  Take 25 mg by mouth daily.      sertraline 50 MG tablet  Commonly known as:  ZOLOFT  Take 1 tablet (50 mg total) by mouth daily.   Indication:  Major Depressive Disorder     tiZANidine 4 MG tablet  Commonly known as:  ZANAFLEX  Take 1 tablet (4 mg total) by mouth every 6 (six) hours as needed for muscle spasms.      traZODone 50 MG tablet  Commonly known as:  DESYREL  Take 1 tablet (50 mg total) by mouth at bedtime as needed for sleep.   Indication:  Trouble Sleeping         Follow-up recommendations:    Patient provided with resources to follow up after discharge for substance abuse treatment and aftercare.   Comments:   Take all your medications as prescribed by your mental healthcare provider.  Report any adverse effects and or reactions  from your medicines to your outpatient provider promptly.  Patient is instructed and cautioned to not engage in alcohol and or illegal drug use while on prescription medicines.  In the event of worsening symptoms, patient is instructed to call the crisis hotline, 911 and or go to the nearest ED for appropriate evaluation and treatment of symptoms.  Follow-up with your primary care provider for your other medical issues, concerns and or health care needs.   SignedElmarie Shiley, NP 03/19/2016, 2:11 PM  I reviewed chart and agreed with the findings and treatment Plan.  Berniece Andreas, MD

## 2016-04-03 ENCOUNTER — Ambulatory Visit: Payer: Medicare Other | Admitting: Podiatry

## 2016-04-21 IMAGING — CT CT FOOT*R* W/O CM
4 series · 11 of 20 positions shown, 12 images · non-contrast
Comparison: Radiograph 06/05/2015 and prior CT 02/04/2015

CLINICAL DATA: History of remote trauma. Evaluate nonhealing
osteotomy.

EXAM:
CT OF THE RIGHT FOOT WITHOUT CONTRAST
TECHNIQUE: Multidetector CT imaging of the right foot was performed according
to the standard protocol. Multiplanar CT image reconstructions were
also generated.

[Series 5: lower ext soft · axial · 0.55mm/px · z∈[-70,-14]mm · 2 of 67 slices shown]
[im 23/67  soft-tissue]
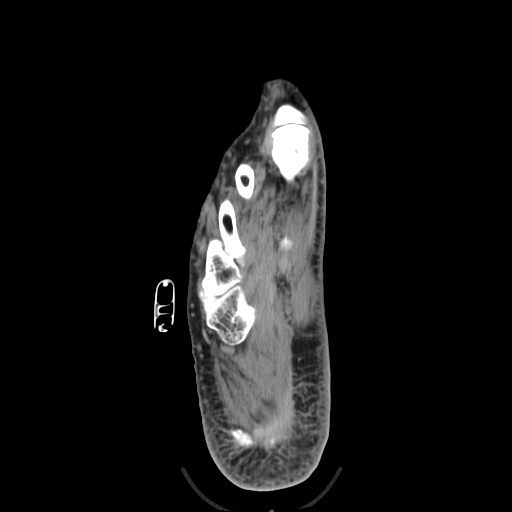
[im 45/67  soft-tissue]
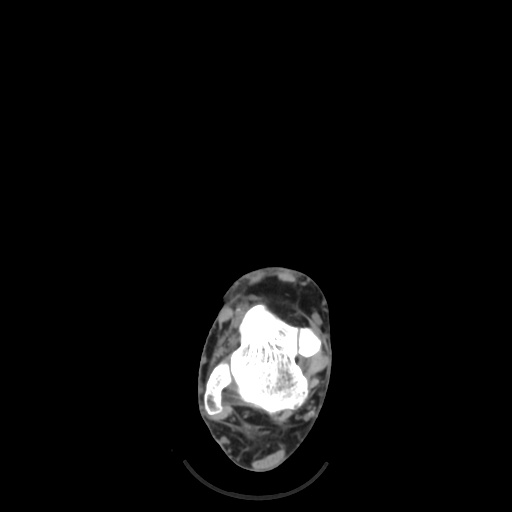

[Series 300: axial soft · coronal · 0.44mm/px · 3 of 139 slices shown]
[im 36/139  bone]
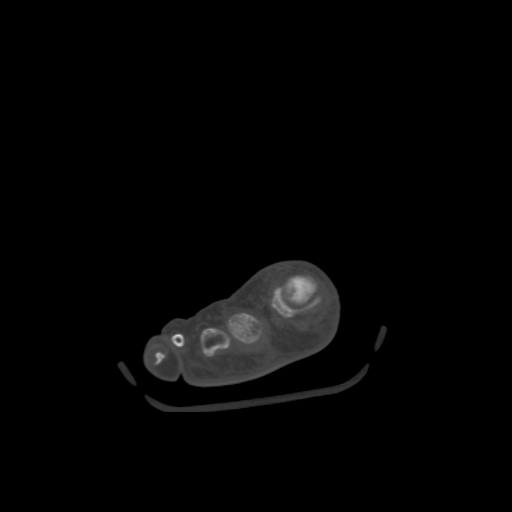
[im 61/139  bone]
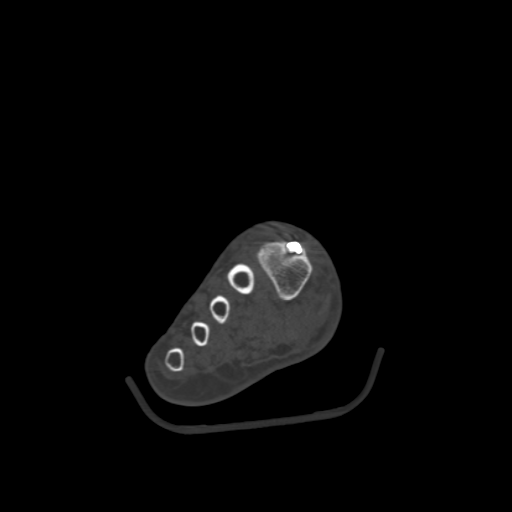
[im 86/139  bone]
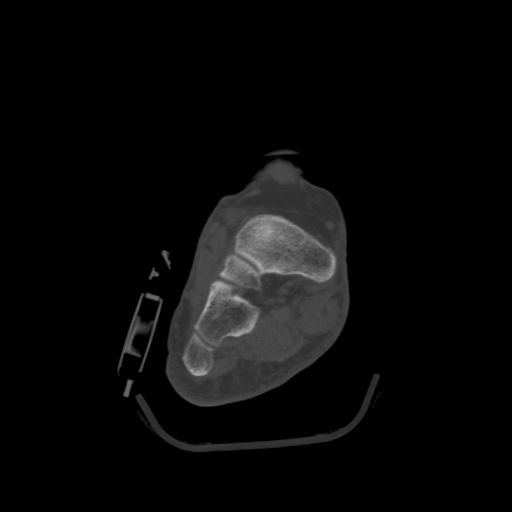

[Series 301: cor soft · axial · 0.55mm/px · z∈[-137,-66]mm · 3 of 78 slices shown, 4 images]
[im 20/78  soft-tissue]
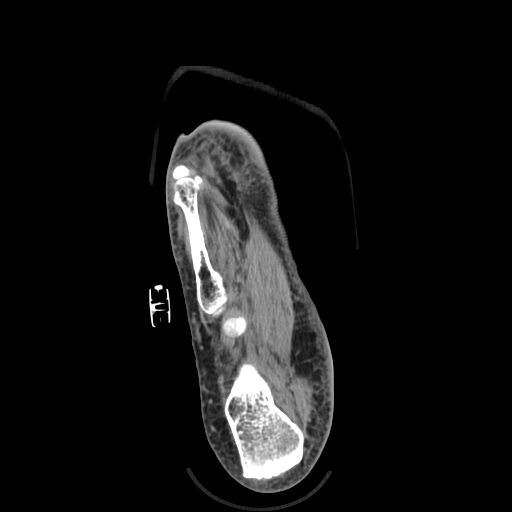
[im 20/78  bone]
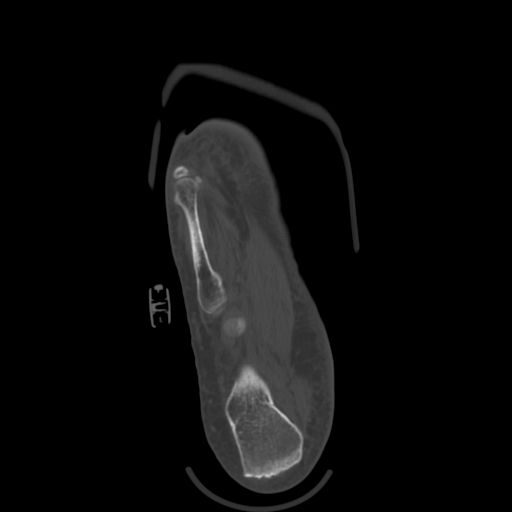
[im 39/78  bone]
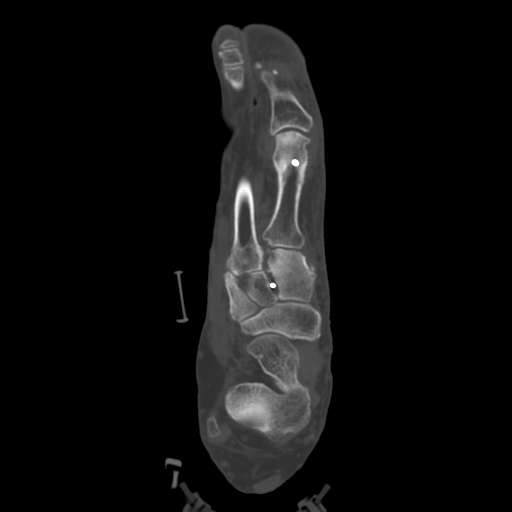
[im 58/78  bone]
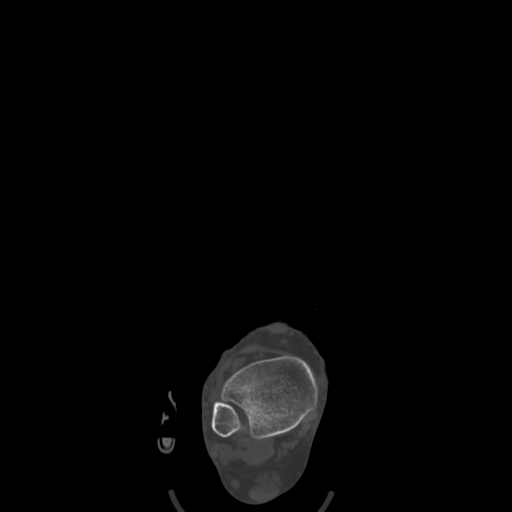

[Series 302: sag soft · sagittal · 0.55mm/px · 3 of 77 slices shown]
[im 20/77  soft-tissue]
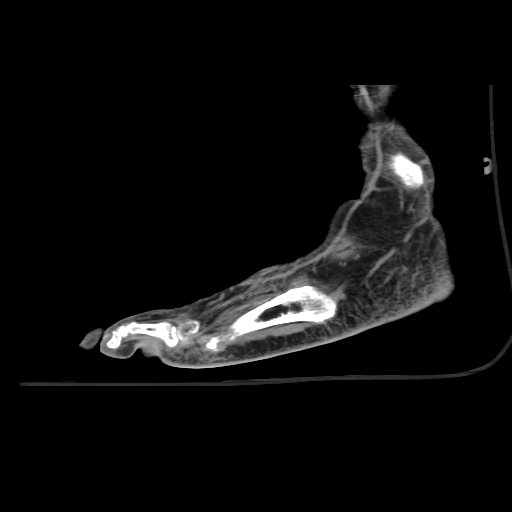
[im 39/77  soft-tissue]
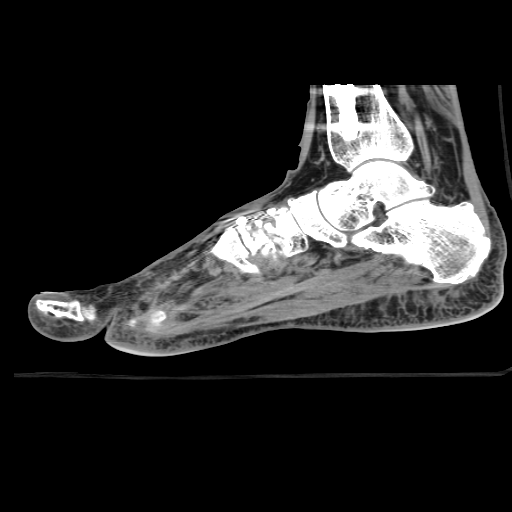
[im 58/77  soft-tissue]
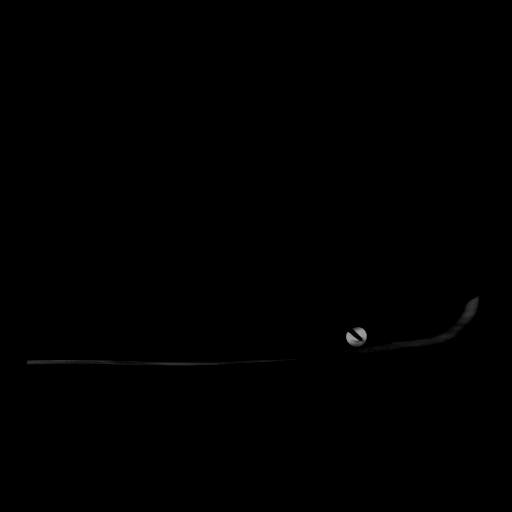

[11 of 20 positions shown; findings below may reference images not displayed]

FINDINGS: Stable CT appearance of the postoperative changes involving the
foot. There is a dorsal plate and screws across the medial cuneiform
first metatarsal joint. The medial cuneiform is fragmented dorsally.
Some of this is likely a bone grafting material which is not
incorporated. The plate and screws are loose. No fusion across the
joint space. Mild to moderate degenerative changes involving the
second and third tarsal metatarsal joints.

Stable osteotomy defect involving the posterior aspect of the upper
calcaneus.

Stable screw in the first metatarsal head. Small subchondral lucency
involving the metatarsal head could be a small focus of AVN. This is
stable. Stable intra medullary rod in the distal tibia with distal
interlocking screws.
IMPRESSION: 1. No change in appearance of the medial cuneiform with
unincorporated bone grafting material and persistent ununited dorsal
fracture. The fixating hardware is loose.
2. No new/acute findings.

## 2016-04-24 ENCOUNTER — Ambulatory Visit: Payer: Medicare Other | Admitting: Podiatry

## 2016-04-28 ENCOUNTER — Ambulatory Visit (INDEPENDENT_AMBULATORY_CARE_PROVIDER_SITE_OTHER): Payer: Medicare Other | Admitting: Podiatry

## 2016-04-28 ENCOUNTER — Ambulatory Visit (INDEPENDENT_AMBULATORY_CARE_PROVIDER_SITE_OTHER): Payer: Medicare Other

## 2016-04-28 DIAGNOSIS — M79671 Pain in right foot: Secondary | ICD-10-CM | POA: Diagnosis not present

## 2016-04-28 DIAGNOSIS — G8929 Other chronic pain: Secondary | ICD-10-CM | POA: Diagnosis not present

## 2016-04-28 DIAGNOSIS — M19071 Primary osteoarthritis, right ankle and foot: Secondary | ICD-10-CM

## 2016-04-28 MED ORDER — OXYCODONE-ACETAMINOPHEN 10-325 MG PO TABS: 1.0000 | ORAL_TABLET | Freq: Three times a day (TID) | ORAL | Status: DC | PRN

## 2016-05-07 NOTE — Progress Notes (Signed)
Patient ID: Katrina Kelley, female   DOB: 1955-12-17, 60 y.o.   MRN: [id number]  Subjective: Katrina Kelley is a 60 y.o. is seen today in office for follow up evaluation of right foot pain. She states that she still continues to have pain to her right foot but this one has decreased. Overall she feels be unchanged compared to last appointment. She does continue to take Percocet which helps is asking her for refill.  She previously underwent a cotton osteotomy with Dr. 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 She subsequently had a revision with a calcaneal bone graft and resection of the nonunion with hardware fixation. She continued to have pain overlying the area and a CT scan was performed which should reveal that there is still no union however the hardware is loose. She did have pain gently upon the hardware is the hardware is removed at that point.  Objective: General: No acute distress, AAOx3  DP/PT pulses palpable 2/4, CRT < 3 sec to all digits.  Protective sensation intact. Motor function intact.  Right foot: Scar from prior surgery is well healed. There is mild edema around the surgical site there is mild pain along the medial cuneiform and overall appears to be unchanged compared to prior evaluation. There is no other areas of tenderness bilaterally. Overall, exam is unchanged.  No other open lesions or pre-ulcerative lesions.  No pain with calf compression, swelling, warmth, erythema.   Assessment and Plan:  Status post Right foot HWR, with continued pain along the midfoot.  -Treatment options discussed including all alternatives, risks, and complications -X-rays obtained and reviewed today. Arthritic changes the cuneiform with some evidence of nonhealing. No evidence of acute fracture otherwise. -Dispensed graphite insert to try to transition to a regular shoe if possible. Oth -Refill Percocet. -Dispensed a graphite insert to wear inside of her regular shoe. Hopefully she can  away from wearing the surgical shoe all the time.  -Follow-up 4 weeks or sooner if needed.  Celesta Gentile, DPM

## 2016-06-02 ENCOUNTER — Encounter: Payer: Self-pay | Admitting: Podiatry

## 2016-06-02 ENCOUNTER — Ambulatory Visit (INDEPENDENT_AMBULATORY_CARE_PROVIDER_SITE_OTHER): Payer: Medicare Other | Admitting: Podiatry

## 2016-06-02 VITALS — BP 104/78 | HR 74 | Resp 12

## 2016-06-02 DIAGNOSIS — G8929 Other chronic pain: Secondary | ICD-10-CM | POA: Diagnosis not present

## 2016-06-02 DIAGNOSIS — M19071 Primary osteoarthritis, right ankle and foot: Secondary | ICD-10-CM

## 2016-06-02 DIAGNOSIS — M79671 Pain in right foot: Secondary | ICD-10-CM

## 2016-06-02 MED ORDER — OXYCODONE-ACETAMINOPHEN 10-325 MG PO TABS: 1.0000 | ORAL_TABLET | Freq: Three times a day (TID) | ORAL | 0 refills | Status: DC | PRN

## 2016-06-05 NOTE — Progress Notes (Signed)
Patient ID: Katrina Kelley, female   DOB: 06/26/1956, 60 y.o.   MRN: [id number]  Subjective: Katrina Kelley is a 60 y.o. is seen today in office for follow up evaluation of right foot pain. She states that the foot is still swelling at times and is painful. She is asking for a refill of pain medication. She states she is not getting pain medication for anyone else.  She did recently order some cream from her insurance company that she discuss the female she's in a try. She states that she did try the graphite insert which did help but she was unable to wear the shoe.  She previously underwent a cotton osteotomy with Dr. 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 She subsequently had a revision with a calcaneal bone graft and resection of the nonunion with hardware fixation. She continued to have pain overlying the area and a CT scan was performed which should reveal that there is still no union however the hardware is loose. She did have pain gently upon the hardware is the hardware is removed at that point.  Objective: General: No acute distress, AAOx3  DP/PT pulses palpable 2/4, CRT < 3 sec to all digits.  Protective sensation intact. Motor function intact.  Right foot: Scar from prior surgery is well healed. There is continued edema around the surgical site there is mild pain along the medial cuneiform and overall appears to be unchanged compared to prior evaluation. There is no other areas of tenderness bilaterally. Overall, exam is unchanged.  No other open lesions or pre-ulcerative lesions.  No pain with calf compression, swelling, warmth, erythema.   Assessment and Plan:  Status post Right foot HWR, with continued pain along the midfoot.  -Treatment options discussed including all alternatives, risks, and complications -Continue elevation and ice to the area as well as supportive shoe gear dry with graphite insert if possible. She is wearing open toed flat sandal today. -Refill  Percocet. -Discussed custom orthotic. -Follow-up in 4 weeks or sooner if any problems arise. In the meantime, encouraged to call the office with any questions, concerns, change in symptoms.   Celesta Gentile, DPM

## 2016-06-30 ENCOUNTER — Ambulatory Visit (INDEPENDENT_AMBULATORY_CARE_PROVIDER_SITE_OTHER): Payer: Medicare Other | Admitting: Podiatry

## 2016-06-30 ENCOUNTER — Encounter: Payer: Self-pay | Admitting: Podiatry

## 2016-06-30 DIAGNOSIS — M79671 Pain in right foot: Secondary | ICD-10-CM | POA: Diagnosis not present

## 2016-06-30 DIAGNOSIS — M19071 Primary osteoarthritis, right ankle and foot: Secondary | ICD-10-CM | POA: Diagnosis not present

## 2016-06-30 DIAGNOSIS — G8929 Other chronic pain: Secondary | ICD-10-CM

## 2016-06-30 MED ORDER — OXYCODONE-ACETAMINOPHEN 10-325 MG PO TABS: 1.0000 | ORAL_TABLET | Freq: Three times a day (TID) | ORAL | 0 refills | Status: DC | PRN

## 2016-07-03 NOTE — Progress Notes (Signed)
Patient ID: Katrina Kelley, female   DOB: Jul 25, 1956, 60 y.o.   MRN: [id number]  Subjective: Katrina Kelley is a 60 y.o. is seen today in office for follow up evaluation of right foot pain. She's been taking meloxicam intermittently for swelling this has not helped. She does continue taking pain medication. She states that she takes 1 Percocet a day but sometimes she'll take 2 if needed. She presents today for refill of this. She states the insert that she had did help but she is unable to wear a shoe all the time. She also states that she is having difficulty to her right leg and she has followed up with orthopedics for this.   She previously underwent a cotton osteotomy with Dr. 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 She subsequently had a revision with a calcaneal bone graft and resection of the nonunion with hardware fixation. She continued to have pain overlying the area and a CT scan was performed which should reveal that there is still no union however the hardware is loose. She did have pain gently upon the hardware is the hardware is removed at that point.  Objective: General: No acute distress, AAOx3  Presents wearing a flat sandal today DP/PT pulses palpable 2/4, CRT < 3 sec to all digits.  Protective sensation intact. Motor function intact.  Right foot: Scar from prior surgery is well healed. There is continued edema around the surgical site there is mild pain along the medial cuneiform and overall appears to be unchanged compared to prior evaluation. There is no other areas of tenderness bilaterally. Overall, exam is unchanged.  No other open lesions or pre-ulcerative lesions.  No pain with calf compression, swelling, warmth, erythema.   Assessment and Plan:  Status post Right foot HWR, with continued pain along the midfoot.  -Treatment options discussed including all alternatives, risks, and complications -Continue elevation and ice to the area as well as supportive shoe gear  dry with graphite insert if possible.  -Refill Percocet. She states she is not getting pain medication from any other physician currently.  -Follow-up in 4 weeks or sooner if any problems arise. In the meantime, encouraged to call the office with any questions, concerns, change in symptoms.   Celesta Gentile, DPM

## 2016-07-28 ENCOUNTER — Encounter: Payer: Self-pay | Admitting: Podiatry

## 2016-07-28 ENCOUNTER — Ambulatory Visit (INDEPENDENT_AMBULATORY_CARE_PROVIDER_SITE_OTHER): Payer: Medicare Other | Admitting: Podiatry

## 2016-07-28 DIAGNOSIS — M79671 Pain in right foot: Secondary | ICD-10-CM | POA: Diagnosis not present

## 2016-07-28 DIAGNOSIS — G8929 Other chronic pain: Secondary | ICD-10-CM

## 2016-07-28 MED ORDER — OXYCODONE-ACETAMINOPHEN 10-325 MG PO TABS: 1.0000 | ORAL_TABLET | Freq: Three times a day (TID) | ORAL | 0 refills | Status: DC | PRN

## 2016-07-31 NOTE — Progress Notes (Signed)
Patient ID: Katrina Kelley, female   DOB: 26-Mar-1956, 60 y.o.   MRN: [id number]  Subjective: Katrina Kelley is a 60 y.o. is seen today in office for follow up evaluation of right foot pain. She's been taking meloxicam intermittently for swelling this has not helped. She also has lidocaine patches she ordered which helps some. She does continue taking pain medication.  She presents today for refill of pain medication today.   She previously underwent a cotton osteotomy with Dr. 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 She subsequently had a revision with a calcaneal bone graft and resection of the nonunion with hardware fixation. She continued to have pain overlying the area and a CT scan was performed which should reveal that there is still no union however the hardware is loose. She did have pain gently upon the hardware is the hardware is removed at that point.  Objective: General: No acute distress, AAOx3  Presents wearing a flat sandal today DP/PT pulses palpable 2/4, CRT < 3 sec to all digits.  Protective sensation intact. Motor function intact.  Right foot: Scar from prior surgery is well healed. There is continued edema around the surgical site there is mild pain along the medial cuneiform and overall appears to be unchanged compared to prior evaluation. There is no other areas of tenderness bilaterally. Overall, exam is unchanged.  No other open lesions or pre-ulcerative lesions.  No pain with calf compression, swelling, warmth, erythema.   Assessment and Plan:  Status post Right foot HWR, with continued pain along the midfoot.  -Treatment options discussed including all alternatives, risks, and complications -Continue elevation and ice to the area. Can also use lidocaine pathches and creams that she has.  -Refill Percocet. She states she is not getting pain medication from any other physician currently.  -Follow-up in 4 weeks or sooner if any problems arise. In the meantime,  encouraged to call the office with any questions, concerns, change in symptoms.   Celesta Gentile, DPM

## 2016-08-18 ENCOUNTER — Encounter: Payer: Self-pay | Admitting: Podiatry

## 2016-08-18 ENCOUNTER — Ambulatory Visit (INDEPENDENT_AMBULATORY_CARE_PROVIDER_SITE_OTHER): Payer: Medicare Other | Admitting: Podiatry

## 2016-08-18 DIAGNOSIS — G8929 Other chronic pain: Secondary | ICD-10-CM | POA: Diagnosis not present

## 2016-08-18 DIAGNOSIS — M79671 Pain in right foot: Secondary | ICD-10-CM | POA: Diagnosis not present

## 2016-08-18 DIAGNOSIS — M19071 Primary osteoarthritis, right ankle and foot: Secondary | ICD-10-CM | POA: Diagnosis not present

## 2016-08-18 MED ORDER — OXYCODONE-ACETAMINOPHEN 10-325 MG PO TABS: 1.0000 | ORAL_TABLET | Freq: Three times a day (TID) | ORAL | 0 refills | Status: DC | PRN

## 2016-08-19 NOTE — Progress Notes (Signed)
Patient ID: Katrina Kelley, female   DOB: 1955-11-13, 60 y.o.   MRN: [id number]  Subjective: Katrina Kelley is a 60 y.o. is seen today in office for follow up evaluation of right foot pain. She's been taking meloxicam intermittently for swelling this has not helped. She also has lidocaine patches she ordered which helps some. She does continue taking pain medication.  She presents today for refill of pain medication today. She denies getting pain medication for another physician.   She previously underwent a cotton osteotomy with Dr. 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 She subsequently had a revision with a calcaneal bone graft and resection of the nonunion with hardware fixation. She continued to have pain overlying the area and a CT scan was performed which should reveal that there is still no union however the hardware is loose. She did have pain gently upon the hardware is the hardware is removed at that point.  Objective: General: No acute distress, AAOx3  Presents wearing a flat sandal today DP/PT pulses palpable 2/4, CRT < 3 sec to all digits.  Protective sensation intact. Motor function intact.  Right foot: Scar from prior surgery is well healed. There is continued edema around the surgical site there is mild pain along the medial cuneiform and overall appears to be unchanged compared to prior evaluation. There is no other areas of tenderness bilaterally. Overall, exam is unchanged.  No other open lesions or pre-ulcerative lesions.  No pain with calf compression, swelling, warmth, erythema.   Assessment and Plan:  Status post Right foot HWR, with continued pain along the midfoot.  -Treatment options discussed including all alternatives, risks, and complications -Continue elevation and ice to the area. Can also use lidocaine pathches and creams that she has.  -Refill Percocet. She states she is not getting pain medication from any other physician currently.  -Follow-up in 4 weeks  or sooner if any problems arise. In the meantime, encouraged to call the office with any questions, concerns, change in symptoms.   Celesta Gentile, DPM

## 2016-08-31 ENCOUNTER — Encounter (HOSPITAL_COMMUNITY): Payer: Self-pay | Admitting: Emergency Medicine

## 2016-08-31 ENCOUNTER — Emergency Department (HOSPITAL_COMMUNITY)
Admission: EM | Admit: 2016-08-31 | Discharge: 2016-08-31 | Disposition: A | Discharge status: Home / Self Care | Payer: Medicare Other | Attending: Emergency Medicine | Admitting: Emergency Medicine

## 2016-08-31 ENCOUNTER — Emergency Department (HOSPITAL_COMMUNITY): Payer: Medicare Other

## 2016-08-31 DIAGNOSIS — Y929 Unspecified place or not applicable: Secondary | ICD-10-CM | POA: Insufficient documentation

## 2016-08-31 DIAGNOSIS — F17210 Nicotine dependence, cigarettes, uncomplicated: Secondary | ICD-10-CM | POA: Insufficient documentation

## 2016-08-31 DIAGNOSIS — Y939 Activity, unspecified: Secondary | ICD-10-CM | POA: Insufficient documentation

## 2016-08-31 DIAGNOSIS — Y999 Unspecified external cause status: Secondary | ICD-10-CM | POA: Diagnosis not present

## 2016-08-31 DIAGNOSIS — S2232XA Fracture of one rib, left side, initial encounter for closed fracture: Secondary | ICD-10-CM | POA: Insufficient documentation

## 2016-08-31 DIAGNOSIS — X501XXA Overexertion from prolonged static or awkward postures, initial encounter: Secondary | ICD-10-CM | POA: Insufficient documentation

## 2016-08-31 DIAGNOSIS — Z91040 Latex allergy status: Secondary | ICD-10-CM | POA: Insufficient documentation

## 2016-08-31 DIAGNOSIS — S299XXA Unspecified injury of thorax, initial encounter: Secondary | ICD-10-CM | POA: Diagnosis present

## 2016-08-31 MED ORDER — METHOCARBAMOL 500 MG PO TABS: 500.0000 mg | ORAL_TABLET | Freq: Two times a day (BID) | ORAL | 0 refills | Status: DC

## 2016-08-31 MED ORDER — DIAZEPAM 5 MG PO TABS
5.0000 mg | ORAL_TABLET | Freq: Once | ORAL | Status: AC
Start: 2016-08-31 — End: 2016-08-31
  Administered 2016-08-31: 5 mg via ORAL
  Filled 2016-08-31: qty 1

## 2016-08-31 MED ORDER — OXYCODONE-ACETAMINOPHEN 5-325 MG PO TABS: 2.0000 | ORAL_TABLET | ORAL | 0 refills | Status: DC | PRN

## 2016-08-31 MED ORDER — OXYCODONE-ACETAMINOPHEN 5-325 MG PO TABS
2.0000 | ORAL_TABLET | Freq: Once | ORAL | Status: AC
  Administered 2016-08-31: 2 via ORAL
  Filled 2016-08-31: qty 2

## 2016-08-31 NOTE — ED Provider Notes (Signed)
Katrina Kelley Provider Note   CSN: [id number] Arrival date & time: 08/31/16  1713     History   Chief Complaint Chief Complaint  Patient presents with  . Rib Injury    HPI Katrina Kelley is a 60 y.o. female.  60 year old female complains of left-sided rib pain that began after she reached over to kill an insect. Complains of sharp pain localized to the sixth and seventh left lateral rib cage. Pain is better with remaining still. Notes dyspnea only with deep inhalation. Denies any fever or chills. No cough or congestion. Has used Motrin without relief. Denies any hemoptysis. Symptoms better with rest      Past Medical History:  Diagnosis Date  . Anxiety   . Bipolar 1 disorder ([place])   . Depression     Patient Active Problem List   Diagnosis Date Noted  . Alcohol use disorder, moderate, dependence ([place]) 03/17/2016  . Cocaine use disorder, severe, dependence ([place]) 03/17/2016  . Substance induced mood disorder ([place]) 03/17/2016  . Chronic foot pain 02/07/2016  . Osteoarthrosis of ankle and foot 02/07/2016  . Appendicitis 03/12/2014  . Major depression, recurrent, chronic ([place]) 08/20/2012    Past Surgical History:  Procedure Laterality Date  . ABDOMINAL HYSTERECTOMY    . ELEVATION OF DEPRESSED SKULL FRACTURE    . FOOT ARTHRODESIS     rt and lt  . FRACTURE SURGERY  2005   fx both lower legs   . LAPAROSCOPIC APPENDECTOMY N/A 03/12/2014   Procedure: APPENDECTOMY LAPAROSCOPIC;  Surgeon: [name], MD;  Location: [facility];  Service: General;  Laterality: N/A;  . TOOTH EXTRACTION  11/27/2011   Procedure: DENTAL RESTORATION/EXTRACTIONS;  Surgeon: [name], DDS;  Location: [facility];  Service: Oral Surgery;  Laterality: N/A;  just extractions    OB History    No data available       Home Medications    Prior to Admission medications   Medication Sig Start Date End Date Taking? Authorizing Provider  calcium carbonate (TUMS - DOSED IN  MG ELEMENTAL CALCIUM) 500 MG chewable tablet Chew 2 tablets by mouth as needed for indigestion or heartburn. Reported on 03/16/2016    Historical Provider, MD  cetirizine (ZYRTEC) 10 MG tablet Take 10 mg by mouth daily as needed for allergies.     Historical Provider, MD  ergocalciferol (VITAMIN D2) 50000 UNITS capsule Take 50,000 Units by mouth once a week. monday    Historical Provider, MD  fluticasone (FLONASE) 50 MCG/ACT nasal spray Place 1 spray into both nostrils as needed. For congestion 09/25/15   Historical Provider, MD  hydrOXYzine (ATARAX/VISTARIL) 25 MG tablet Take 25 mg by mouth every 8 (eight) hours as needed for itching.  07/01/15   Historical Provider, MD  naproxen (NAPROSYN) 500 MG tablet Take 1 tablet (500 mg total) by mouth 2 (two) times daily. Patient taking differently: Take 500 mg by mouth 2 (two) times daily as needed for mild pain or moderate pain.  10/02/15   [name], PA-C  oxyCODONE-acetaminophen (PERCOCET) 10-325 MG tablet Take 1 tablet by mouth every 8 (eight) hours as needed for pain. MAXIMUM TOTAL ACETAMINOPHEN DOSE IS 4000 MG PER DAY 08/18/16   [name], DPM  pseudoephedrine (SUDAFED) 60 MG tablet Take 1 tablet (60 mg total) by mouth every 6 (six) hours as needed for congestion. 10/02/15   [name], PA-C  QUEtiapine (SEROQUEL) 100 MG tablet Take 100 mg by mouth daily.  07/02/15  Historical Provider, MD  QUEtiapine (SEROQUEL) 100 MG tablet Take 1 tablet (100 mg total) by mouth at bedtime. 03/19/16   [name], NP  sertraline (ZOLOFT) 25 MG tablet Take 25 mg by mouth daily.  07/02/15   Historical Provider, MD  sertraline (ZOLOFT) 50 MG tablet Take 1 tablet (50 mg total) by mouth daily. 03/19/16   [name], NP  tiZANidine (ZANAFLEX) 4 MG tablet Take 1 tablet (4 mg total) by mouth every 6 (six) hours as needed for muscle spasms. 10/28/15   [name], PA-C  traZODone (DESYREL) 50 MG tablet Take 1 tablet (50 mg total) by mouth at bedtime as needed for sleep.  03/19/16   [name], NP    Family History Family History  Problem Relation Age of Onset  . Hyperlipidemia Son     Social History Social History  Substance Use Topics  . Smoking status: Current Every Day Smoker    Packs/day: 0.25    Years: 38.00    Types: Cigarettes  . Smokeless tobacco: Not on file  . Alcohol use Yes     Comment: 18 beers and however much alcohol she can get     Allergies   Demerol; Latex; Penicillins; Strawberry extract; and Vicodin [hydrocodone-acetaminophen]   Review of Systems Review of Systems  All other systems reviewed and are negative.    Physical Exam Updated Vital Signs BP 132/76 (BP Location: Left Arm)   Pulse 73   Temp 98 F (36.7 C) (Oral)   Resp 16   Ht 5\' 9"  (0000000 m)   Wt 87.5 kg   LMP 03/19/1985   SpO2 100%   BMI 28.50 kg/m   Physical Exam  Constitutional: She is oriented to person, place, and time. She appears well-developed and well-nourished.  Non-toxic appearance. No distress.  HENT:  Head: Normocephalic and atraumatic.  Eyes: Conjunctivae, EOM and lids are normal. Pupils are equal, round, and reactive to light.  Neck: Normal range of motion. Neck supple. No tracheal deviation present. No thyroid mass present.  Cardiovascular: Normal rate, regular rhythm and normal heart sounds.  Exam reveals no gallop.   No murmur heard. Pulmonary/Chest: Effort normal and breath sounds normal. No stridor. No respiratory distress. She has no decreased breath sounds. She has no wheezes. She has no rhonchi. She has no rales. She exhibits tenderness. She exhibits no crepitus.    Abdominal: Soft. Normal appearance and bowel sounds are normal. She exhibits no distension. There is no tenderness. There is no rebound and no CVA tenderness.  Musculoskeletal: Normal range of motion. She exhibits no edema or tenderness.  Neurological: She is alert and oriented to person, place, and time. She has normal strength. No cranial nerve deficit or  sensory deficit. GCS eye subscore is 4. GCS verbal subscore is 5. GCS motor subscore is 6.  Skin: Skin is warm and dry. No abrasion and no rash noted.  Psychiatric: She has a normal mood and affect. Her speech is normal and behavior is normal.  Nursing note and vitals reviewed.    ED Treatments / Results  Labs (all labs ordered are listed, but only abnormal results are displayed) Labs Reviewed - No data to display  EKG  EKG Interpretation None       Radiology No results found.  Procedures Procedures (including critical care time)  Medications Ordered in ED Medications  diazepam (VALIUM) tablet 5 mg (not administered)  oxyCODONE-acetaminophen (PERCOCET/ROXICET) 5-325 MG per tablet 2 tablet (not administered)  Initial Impression / Assessment and Plan / ED Course  I have reviewed the triage vital signs and the nursing notes.  Pertinent labs & imaging results that were available during my care of the patient were reviewed by me and considered in my medical decision making (see chart for details).  Clinical Course     Patient has not displaced rib fracture and was treated with Percocet and Valium here and feels better.  Final Clinical Impressions(s) / ED Diagnoses   Final diagnoses:  None    New Prescriptions New Prescriptions   No medications on file     [name], MD 08/31/16 1856

## 2016-08-31 NOTE — ED Notes (Signed)
Pt provided meal, d/c in no acute distress. Advised of signs and symptoms of complications and why to return to ED. Pt voiced understanding.
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
## 2016-09-15 ENCOUNTER — Ambulatory Visit: Payer: Medicare Other | Admitting: Podiatry

## 2016-09-15 ENCOUNTER — Other Ambulatory Visit: Payer: Self-pay | Admitting: Podiatry

## 2016-09-15 MED ORDER — OXYCODONE-ACETAMINOPHEN 10-325 MG PO TABS: 1.0000 | ORAL_TABLET | Freq: Three times a day (TID) | ORAL | 0 refills | Status: DC | PRN

## 2016-09-15 NOTE — Progress Notes (Signed)
Patient presents today for a refill of medication. She was late for her appointment but would like a refill. Refilled percocet.

## 2016-10-20 ENCOUNTER — Encounter (HOSPITAL_COMMUNITY): Payer: Self-pay

## 2016-10-20 ENCOUNTER — Ambulatory Visit (INDEPENDENT_AMBULATORY_CARE_PROVIDER_SITE_OTHER): Payer: Medicare Other | Admitting: Podiatry

## 2016-10-20 ENCOUNTER — Emergency Department (HOSPITAL_COMMUNITY)
Admission: EM | Admit: 2016-10-20 | Discharge: 2016-10-20 | Disposition: A | Discharge status: Home / Self Care | Payer: Medicare Other | Attending: Emergency Medicine | Admitting: Emergency Medicine

## 2016-10-20 ENCOUNTER — Encounter: Payer: Self-pay | Admitting: Podiatry

## 2016-10-20 DIAGNOSIS — Y9389 Activity, other specified: Secondary | ICD-10-CM | POA: Insufficient documentation

## 2016-10-20 DIAGNOSIS — Y999 Unspecified external cause status: Secondary | ICD-10-CM | POA: Insufficient documentation

## 2016-10-20 DIAGNOSIS — M79671 Pain in right foot: Secondary | ICD-10-CM | POA: Diagnosis not present

## 2016-10-20 DIAGNOSIS — G8929 Other chronic pain: Secondary | ICD-10-CM

## 2016-10-20 DIAGNOSIS — S199XXA Unspecified injury of neck, initial encounter: Secondary | ICD-10-CM | POA: Insufficient documentation

## 2016-10-20 DIAGNOSIS — F17210 Nicotine dependence, cigarettes, uncomplicated: Secondary | ICD-10-CM | POA: Diagnosis not present

## 2016-10-20 DIAGNOSIS — Y92410 Unspecified street and highway as the place of occurrence of the external cause: Secondary | ICD-10-CM | POA: Insufficient documentation

## 2016-10-20 DIAGNOSIS — Z79899 Other long term (current) drug therapy: Secondary | ICD-10-CM | POA: Insufficient documentation

## 2016-10-20 DIAGNOSIS — Z91040 Latex allergy status: Secondary | ICD-10-CM | POA: Diagnosis not present

## 2016-10-20 DIAGNOSIS — M19071 Primary osteoarthritis, right ankle and foot: Secondary | ICD-10-CM | POA: Diagnosis not present

## 2016-10-20 DIAGNOSIS — M542 Cervicalgia: Secondary | ICD-10-CM

## 2016-10-20 DIAGNOSIS — M545 Low back pain, unspecified: Secondary | ICD-10-CM

## 2016-10-20 DIAGNOSIS — S3992XA Unspecified injury of lower back, initial encounter: Secondary | ICD-10-CM | POA: Insufficient documentation

## 2016-10-20 MED ORDER — ACETAMINOPHEN 325 MG PO TABS
650.0000 mg | ORAL_TABLET | Freq: Once | ORAL | Status: AC
  Administered 2016-10-20: 650 mg via ORAL
  Filled 2016-10-20: qty 2

## 2016-10-20 MED ORDER — METHOCARBAMOL 500 MG PO TABS: 500.0000 mg | ORAL_TABLET | Freq: Two times a day (BID) | ORAL | 0 refills | Status: DC | PRN

## 2016-10-20 MED ORDER — OXYCODONE-ACETAMINOPHEN 10-325 MG PO TABS: 1.0000 | ORAL_TABLET | Freq: Three times a day (TID) | ORAL | 0 refills | Status: DC | PRN

## 2016-10-20 MED ORDER — NAPROXEN 250 MG PO TABS: 250.0000 mg | ORAL_TABLET | Freq: Two times a day (BID) | ORAL | 0 refills | Status: DC
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
## 2016-10-20 NOTE — Discharge Planning (Signed)
Pt up for discharge. EDCM reviewed chart for possible CM needs.  No needs identified or communicated.  

## 2016-10-20 NOTE — Progress Notes (Signed)
Patient ID: Katrina Kelley, female   DOB: 1956/05/25, 61 y.o.   MRN: [id number]  Subjective: Katrina Kelley is a 61 y.o. is seen today in office for follow up evaluation of right foot pain and for medication refill.   She's been taking meloxicam intermittently for swelling this has not helped. She also has lidocaine patches she ordered which helps some. She does continue taking pain medication.  She presents today for refill of pain medication today. She denies getting pain medication for another physician.   She previously underwent a cotton osteotomy with Dr. 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 She subsequently had a revision with a calcaneal bone graft and resection of the nonunion with hardware fixation. She continued to have pain overlying the area and a CT scan was performed which should reveal that there is still no union however the hardware is loose. She did have pain gently upon the hardware is the hardware is removed at that point.  Objective: General: No acute distress, AAOx3  Presents wearing a flat sandal today DP/PT pulses palpable 2/4, CRT < 3 sec to all digits.  Protective sensation intact. Motor function intact.  Right foot: Scar from prior surgery is well healed. There is continued mild edema around the surgical site. There is mild pain along the medial cuneiform and overall appears to be unchanged compared to prior evaluation. There is no other areas of tenderness bilaterally. Overall, exam is unchanged.  No other open lesions or pre-ulcerative lesions.  No pain with calf compression, swelling, warmth, erythema.   Assessment and Plan:  Status post Right foot HWR, with continued pain along the midfoot.  -Treatment options discussed including all alternatives, risks, and complications -Continue elevation and ice to the area. Can also use lidocaine pathches and creams that she has.  -Refill Percocet. She states she is not getting pain medication from any other  physician currently.  -At this time will also place a pain management referral due to chronic pain.  -Follow-up in 4 weeks or sooner if any problems arise. In the meantime, encouraged to call the office with any questions, concerns, change in symptoms.   Celesta Gentile, DPM

## 2016-10-23 ENCOUNTER — Telehealth: Payer: Self-pay | Admitting: *Deleted

## 2016-10-23 DIAGNOSIS — M79671 Pain in right foot: Principal | ICD-10-CM

## 2016-10-23 DIAGNOSIS — M19071 Primary osteoarthritis, right ankle and foot: Secondary | ICD-10-CM

## 2016-10-23 DIAGNOSIS — G8929 Other chronic pain: Secondary | ICD-10-CM

## 2016-10-23 NOTE — Telephone Encounter (Addendum)
-----   Message from [name], DPM sent at 10/20/2016  1:25 PM EST ----- Can you please put in for a pain management consult for her due to chronic foot and leg pain. I have been giving 30 percocet monthly for a long time but given these new laws she would benefit from pain management. 10/23/2016-Required form, clinicals and demographics faxed to the [facility].

## 2016-10-23 NOTE — Telephone Encounter (Deleted)
-----   Message from [name], DPM sent at 10/20/2016  1:25 PM EST ----- Can you please put in for a pain management consult for her due to chronic foot and leg pain. I have been giving 30 percocet monthly for a long time but given these new laws she would benefit from pain management.

## 2016-11-17 ENCOUNTER — Ambulatory Visit: Payer: Medicare Other | Admitting: Podiatry

## 2016-12-21 ENCOUNTER — Emergency Department (HOSPITAL_COMMUNITY)
Admission: EM | Admit: 2016-12-21 | Discharge: 2016-12-21 | Disposition: A | Discharge status: Home / Self Care | Payer: Medicare Other | Attending: Emergency Medicine | Admitting: Emergency Medicine

## 2016-12-21 ENCOUNTER — Encounter (HOSPITAL_COMMUNITY): Payer: Self-pay

## 2016-12-21 ENCOUNTER — Emergency Department (HOSPITAL_COMMUNITY): Payer: Medicare Other

## 2016-12-21 DIAGNOSIS — K0889 Other specified disorders of teeth and supporting structures: Secondary | ICD-10-CM | POA: Diagnosis not present

## 2016-12-21 DIAGNOSIS — Z79899 Other long term (current) drug therapy: Secondary | ICD-10-CM | POA: Diagnosis not present

## 2016-12-21 DIAGNOSIS — M79671 Pain in right foot: Secondary | ICD-10-CM | POA: Diagnosis not present

## 2016-12-21 DIAGNOSIS — G8929 Other chronic pain: Secondary | ICD-10-CM | POA: Diagnosis not present

## 2016-12-21 DIAGNOSIS — F17210 Nicotine dependence, cigarettes, uncomplicated: Secondary | ICD-10-CM | POA: Diagnosis not present

## 2016-12-21 DIAGNOSIS — Z91040 Latex allergy status: Secondary | ICD-10-CM | POA: Diagnosis not present

## 2016-12-21 HISTORY — DX: Unspecified osteoarthritis, unspecified site: M19.90

## 2016-12-21 NOTE — ED Triage Notes (Signed)
Per Pt, Pt is coming from home with a toothache x 1 week. Reports using orajel, but the pain persists. Denies fevers. Also, complains of right foot swelling that has recurred from an old injury. Pt reports using medication with no relief.
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
## 2016-12-25 ENCOUNTER — Encounter (HOSPITAL_COMMUNITY): Payer: Self-pay | Admitting: Emergency Medicine

## 2016-12-25 ENCOUNTER — Emergency Department (HOSPITAL_COMMUNITY)
Admission: EM | Admit: 2016-12-25 | Discharge: 2016-12-25 | Disposition: A | Discharge status: Home / Self Care | Payer: Medicare Other | Attending: Physician Assistant | Admitting: Physician Assistant

## 2016-12-25 DIAGNOSIS — F17210 Nicotine dependence, cigarettes, uncomplicated: Secondary | ICD-10-CM | POA: Diagnosis not present

## 2016-12-25 DIAGNOSIS — Z5321 Procedure and treatment not carried out due to patient leaving prior to being seen by health care provider: Secondary | ICD-10-CM | POA: Diagnosis not present

## 2016-12-25 DIAGNOSIS — M79672 Pain in left foot: Secondary | ICD-10-CM | POA: Insufficient documentation
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
## 2016-12-25 NOTE — ED Notes (Signed)
Pt wheeled back to room, reporting that left foot was in "so much pain that I can't walk on it."  Pt helped to the bed.

## 2016-12-25 NOTE — ED Provider Notes (Cosign Needed)
11:48 PM: went to see patient but RN states patient left the ED shortly after she placed patient in the exam room. Patient had signed into triage with complaint of left foot pain. She was brought to the exam room via W/C and got out of the W/C and walked to the exit per RN.  BP 116/80 (BP Location: Right Arm)   Pulse 106   Temp 99.1 F (37.3 C) (Oral)   Resp 22   LMP 03/19/1985   SpO2 99%     Ashley Murrain, NP 12/26/16 0153

## 2016-12-25 NOTE — ED Notes (Signed)
Pt found wondering the ED stating that she was looking for the way out.  Pt stated that she changed her mind she did not want to be seen.  RN explained that she was next to be seen by the provider.  Pt insisted that she just wanted to leave.  Provider notified.

## 2017-01-17 ENCOUNTER — Emergency Department (HOSPITAL_COMMUNITY)
Admission: EM | Admit: 2017-01-17 | Discharge: 2017-01-17 | Disposition: A | Discharge status: Home / Self Care | Payer: Medicare Other | Attending: Emergency Medicine | Admitting: Emergency Medicine

## 2017-01-17 ENCOUNTER — Encounter (HOSPITAL_COMMUNITY): Payer: Self-pay

## 2017-01-17 DIAGNOSIS — Z91040 Latex allergy status: Secondary | ICD-10-CM | POA: Insufficient documentation

## 2017-01-17 DIAGNOSIS — F17210 Nicotine dependence, cigarettes, uncomplicated: Secondary | ICD-10-CM | POA: Insufficient documentation

## 2017-01-17 DIAGNOSIS — M79605 Pain in left leg: Secondary | ICD-10-CM | POA: Insufficient documentation

## 2017-01-17 DIAGNOSIS — M791 Myalgia, unspecified site: Secondary | ICD-10-CM

## 2017-01-17 MED ORDER — ACETAMINOPHEN 500 MG PO TABS
1000.0000 mg | ORAL_TABLET | Freq: Once | ORAL | Status: AC
  Administered 2017-01-17: 1000 mg via ORAL
  Filled 2017-01-17: qty 2

## 2017-01-17 NOTE — ED Triage Notes (Signed)
Patient states that she had bilateral leg reconstructive surgery in 2009 and has chronic leg pain from same. Complains of increased pain to left leg the past few days, no trauma. Worse with ambulation
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
## 2017-01-28 ENCOUNTER — Encounter (HOSPITAL_COMMUNITY): Payer: Self-pay | Admitting: Emergency Medicine

## 2017-01-28 ENCOUNTER — Emergency Department (HOSPITAL_COMMUNITY)
Admission: EM | Admit: 2017-01-28 | Discharge: 2017-01-28 | Disposition: A | Discharge status: Home / Self Care | Payer: Medicare Other | Attending: Emergency Medicine | Admitting: Emergency Medicine

## 2017-01-28 DIAGNOSIS — F17210 Nicotine dependence, cigarettes, uncomplicated: Secondary | ICD-10-CM | POA: Diagnosis not present

## 2017-01-28 DIAGNOSIS — Z91040 Latex allergy status: Secondary | ICD-10-CM | POA: Diagnosis not present

## 2017-01-28 DIAGNOSIS — Z79899 Other long term (current) drug therapy: Secondary | ICD-10-CM | POA: Diagnosis not present

## 2017-01-28 DIAGNOSIS — M545 Low back pain, unspecified: Secondary | ICD-10-CM

## 2017-01-28 LAB — URINALYSIS, ROUTINE W REFLEX MICROSCOPIC
BILIRUBIN URINE: NEGATIVE
Glucose, UA: NEGATIVE mg/dL
HGB URINE DIPSTICK: NEGATIVE
Ketones, ur: NEGATIVE mg/dL
Leukocytes, UA: NEGATIVE
Nitrite: NEGATIVE
PROTEIN: NEGATIVE mg/dL
Specific Gravity, Urine: 1.028 (ref 1.005–1.030)
pH: 5 (ref 5.0–8.0)

## 2017-01-28 MED ORDER — MELOXICAM 15 MG PO TABS: 15.0000 mg | ORAL_TABLET | Freq: Every day | ORAL | 0 refills | Status: DC

## 2017-01-28 MED ORDER — METHOCARBAMOL 500 MG PO TABS: 500.0000 mg | ORAL_TABLET | Freq: Two times a day (BID) | ORAL | 0 refills | Status: DC

## 2017-01-28 MED ORDER — ACETAMINOPHEN 325 MG PO TABS
650.0000 mg | ORAL_TABLET | Freq: Once | ORAL | Status: AC
  Administered 2017-01-28: 650 mg via ORAL
  Filled 2017-01-28: qty 2

## 2017-01-28 NOTE — ED Triage Notes (Signed)
EMS arrived pt. c/o rt. Flank  Pain for 3 days.

## 2017-01-28 NOTE — Discharge Instructions (Signed)
Begin taking Robaxin and Mobic for pain and muscle spasms. Follow up with PCP for further evaluation of symptoms. Return to ED for worsening symptoms, trouble breathing, blood in stool, vomiting, inability to walk or numbness/weakness.
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
## 2017-03-16 ENCOUNTER — Ambulatory Visit: Payer: Medicare Other | Admitting: Podiatry

## 2017-03-27 ENCOUNTER — Emergency Department (HOSPITAL_COMMUNITY)
Admission: EM | Admit: 2017-03-27 | Discharge: 2017-03-27 | Disposition: A | Discharge status: Home / Self Care | Payer: Medicare Other | Attending: Emergency Medicine | Admitting: Emergency Medicine

## 2017-03-27 ENCOUNTER — Encounter (HOSPITAL_COMMUNITY): Payer: Self-pay | Admitting: Nurse Practitioner

## 2017-03-27 DIAGNOSIS — W57XXXA Bitten or stung by nonvenomous insect and other nonvenomous arthropods, initial encounter: Secondary | ICD-10-CM | POA: Insufficient documentation

## 2017-03-27 DIAGNOSIS — Y9389 Activity, other specified: Secondary | ICD-10-CM | POA: Diagnosis not present

## 2017-03-27 DIAGNOSIS — S40861A Insect bite (nonvenomous) of right upper arm, initial encounter: Secondary | ICD-10-CM | POA: Insufficient documentation

## 2017-03-27 DIAGNOSIS — Y9289 Other specified places as the place of occurrence of the external cause: Secondary | ICD-10-CM | POA: Diagnosis not present

## 2017-03-27 DIAGNOSIS — Z79899 Other long term (current) drug therapy: Secondary | ICD-10-CM | POA: Insufficient documentation

## 2017-03-27 DIAGNOSIS — Z5321 Procedure and treatment not carried out due to patient leaving prior to being seen by health care provider: Secondary | ICD-10-CM | POA: Insufficient documentation

## 2017-03-27 DIAGNOSIS — Z87891 Personal history of nicotine dependence: Secondary | ICD-10-CM | POA: Diagnosis not present

## 2017-03-27 DIAGNOSIS — Y999 Unspecified external cause status: Secondary | ICD-10-CM | POA: Insufficient documentation

## 2017-03-27 NOTE — ED Notes (Signed)
Patient called x 2 with no response. 

## 2017-03-27 NOTE — ED Notes (Signed)
Called once more with no response.  Discharged from system at this time.

## 2017-03-27 NOTE — ED Triage Notes (Signed)
Patient states she was working outside three days ago and noticed tick on her right arm and she took it off. States last night she noticed something on her left breast states today realized it might be another tick. Flattened circular organism noted to left breast pulled out to remove head and placed in specimen cup with patient. Patient denies pain, fevers, chills, rash, or nausea.

## 2017-03-27 NOTE — ED Notes (Signed)
Patient called with no response.

## 2017-04-07 ENCOUNTER — Encounter (HOSPITAL_BASED_OUTPATIENT_CLINIC_OR_DEPARTMENT_OTHER): Payer: Self-pay | Admitting: Emergency Medicine

## 2017-04-07 ENCOUNTER — Emergency Department (HOSPITAL_BASED_OUTPATIENT_CLINIC_OR_DEPARTMENT_OTHER)
Admission: EM | Admit: 2017-04-07 | Discharge: 2017-04-07 | Disposition: A | Discharge status: Home / Self Care | Payer: Medicare Other | Attending: Emergency Medicine | Admitting: Emergency Medicine

## 2017-04-07 DIAGNOSIS — Z79899 Other long term (current) drug therapy: Secondary | ICD-10-CM | POA: Diagnosis not present

## 2017-04-07 DIAGNOSIS — Z87891 Personal history of nicotine dependence: Secondary | ICD-10-CM | POA: Diagnosis not present

## 2017-04-07 DIAGNOSIS — G8929 Other chronic pain: Secondary | ICD-10-CM | POA: Insufficient documentation

## 2017-04-07 DIAGNOSIS — B9689 Other specified bacterial agents as the cause of diseases classified elsewhere: Secondary | ICD-10-CM

## 2017-04-07 DIAGNOSIS — N898 Other specified noninflammatory disorders of vagina: Secondary | ICD-10-CM | POA: Diagnosis present

## 2017-04-07 DIAGNOSIS — Z91040 Latex allergy status: Secondary | ICD-10-CM | POA: Insufficient documentation

## 2017-04-07 DIAGNOSIS — Z791 Long term (current) use of non-steroidal anti-inflammatories (NSAID): Secondary | ICD-10-CM | POA: Insufficient documentation

## 2017-04-07 DIAGNOSIS — N760 Acute vaginitis: Secondary | ICD-10-CM | POA: Insufficient documentation

## 2017-04-07 LAB — URINALYSIS, ROUTINE W REFLEX MICROSCOPIC
Bilirubin Urine: NEGATIVE
GLUCOSE, UA: NEGATIVE mg/dL
Hgb urine dipstick: NEGATIVE
KETONES UR: NEGATIVE mg/dL
LEUKOCYTES UA: NEGATIVE
Nitrite: NEGATIVE
PROTEIN: NEGATIVE mg/dL
Specific Gravity, Urine: 1.019 (ref 1.005–1.030)
pH: 7 (ref 5.0–8.0)

## 2017-04-07 LAB — WET PREP, GENITAL
Sperm: NONE SEEN
Trich, Wet Prep: NONE SEEN
YEAST WET PREP: NONE SEEN

## 2017-04-07 MED ORDER — METRONIDAZOLE 500 MG PO TABS: 500.0000 mg | ORAL_TABLET | Freq: Two times a day (BID) | ORAL | 0 refills | Status: DC

## 2017-04-07 NOTE — ED Provider Notes (Signed)
[facility] DEPT [facility] Provider Note   CSN: [id number] Arrival date & time: 04/07/17  1552 By signing my name below, I, Dyke Brackett, attest that this documentation has been prepared under the direction and in the presence of Davonna Belling, MD . Electronically Signed: Dyke Brackett, Scribe. 04/07/2017. 5:40 PM.   History   Chief Complaint Chief Complaint  Patient presents with  . Vaginal Discharge    HPI Katrina Kelley is a 61 y.o. female who presents to the Emergency Department complaining of malodorous vaginal discharge onset a few weeks ago. Per pt, the discharge was initially light yellow and is now dark yellow. She denies any vaginal pain, vaginal itching, nausea, vomiting, diarrhea, chest pain, or SOB. She denies any suspicion of STD. No recent STD.   She also reports sinus pressure onset three days ago. Pt notes associated congestion, otalgia, sore throat, postnasal drainage. No alleviating or modifying factors noted.  She reports personal history of seasonal allergies. Pt is a resident at [facility] for polysubstance abuse and has been clean for two weeks.   The history is provided by the patient. No language interpreter was used.   Past Medical History:  Diagnosis Date  . Anxiety   . Arthritis   . Bipolar 1 disorder ([place])   . Depression     Patient Active Problem List   Diagnosis Date Noted  . Alcohol use disorder, moderate, dependence ([place]) 03/17/2016  . Cocaine use disorder, severe, dependence ([place]) 03/17/2016  . Substance induced mood disorder ([place]) 03/17/2016  . Chronic foot pain 02/07/2016  . Osteoarthrosis of ankle and foot 02/07/2016  . Appendicitis 03/12/2014  . Major depression, recurrent, chronic ([place]) 08/20/2012    Past Surgical History:  Procedure Laterality Date  . ABDOMINAL HYSTERECTOMY    . ELEVATION OF DEPRESSED SKULL FRACTURE    . FOOT ARTHRODESIS     rt and lt  . FRACTURE SURGERY  2005   fx both lower legs   . LAPAROSCOPIC  APPENDECTOMY N/A 03/12/2014   Procedure: APPENDECTOMY LAPAROSCOPIC;  Surgeon: [name], MD;  Location: [facility];  Service: General;  Laterality: N/A;  . TOOTH EXTRACTION  11/27/2011   Procedure: DENTAL RESTORATION/EXTRACTIONS;  Surgeon: [name], DDS;  Location: [facility];  Service: Oral Surgery;  Laterality: N/A;  just extractions    OB History    No data available       Home Medications    Prior to Admission medications   Medication Sig Start Date End Date Taking? Authorizing Provider  calcium carbonate (TUMS - DOSED IN MG ELEMENTAL CALCIUM) 500 MG chewable tablet Chew 2 tablets by mouth as needed for indigestion or heartburn. Reported on 03/16/2016    [provider]  cetirizine (ZYRTEC) 10 MG tablet Take 10 mg by mouth daily as needed for allergies.     [provider]  ergocalciferol (VITAMIN D2) 50000 UNITS capsule Take 50,000 Units by mouth once a week. monday    [provider]  fluticasone (FLONASE) 50 MCG/ACT nasal spray Place 1 spray into both nostrils as needed. For congestion 09/25/15   [provider]  hydrOXYzine (ATARAX/VISTARIL) 25 MG tablet Take 25 mg by mouth every 8 (eight) hours as needed for itching.  07/01/15   [provider]  meloxicam (MOBIC) 15 MG tablet Take 1 tablet (15 mg total) by mouth daily. 01/28/17   Khatri, Hina, PA-C  methocarbamol (ROBAXIN) 500 MG tablet Take 1 tablet (500 mg total) by mouth 2 (two) times  daily. 01/28/17   Khatri, Hina, PA-C  metroNIDAZOLE (FLAGYL) 500 MG tablet Take 1 tablet (500 mg total) by mouth 2 (two) times daily. 04/07/17   Davonna Belling, MD  naproxen (NAPROSYN) 250 MG tablet Take 1 tablet (250 mg total) by mouth 2 (two) times daily with a meal. 10/20/16   [name], PA-C  oxyCODONE-acetaminophen (PERCOCET) 10-325 MG tablet Take 1 tablet by mouth every 8 (eight) hours as needed for pain. MAXIMUM TOTAL ACETAMINOPHEN DOSE IS 4000 MG PER DAY 10/20/16   [name], DPM  oxyCODONE-acetaminophen (PERCOCET/ROXICET) 5-325 MG tablet Take 2 tablets by mouth every 4 (four) hours as needed for severe pain. 08/31/16   [name], MD  pseudoephedrine (SUDAFED) 60 MG tablet Take 1 tablet (60 mg total) by mouth every 6 (six) hours as needed for congestion. 10/02/15   [name], PA-C  QUEtiapine (SEROQUEL) 100 MG tablet Take 100 mg by mouth daily.  07/02/15   [provider]  QUEtiapine (SEROQUEL) 100 MG tablet Take 1 tablet (100 mg total) by mouth at bedtime. 03/19/16   [name], NP  sertraline (ZOLOFT) 25 MG tablet Take 25 mg by mouth daily.  07/02/15   [provider]  sertraline (ZOLOFT) 50 MG tablet Take 1 tablet (50 mg total) by mouth daily. 03/19/16   [name], NP  tiZANidine (ZANAFLEX) 4 MG tablet Take 1 tablet (4 mg total) by mouth every 6 (six) hours as needed for muscle spasms. 10/28/15   [name], PA-C  traZODone (DESYREL) 50 MG tablet Take 1 tablet (50 mg total) by mouth at bedtime as needed for sleep. 03/19/16   [name], NP    Family History Family History  Problem Relation Age of Onset  . Hyperlipidemia Son     Social History Social History  Substance Use Topics  . Smoking status: Former Smoker    Packs/day: 0.25    Years: 38.00    Types: Cigarettes    Quit date: 03/22/2017  . Smokeless tobacco: Never Used  . Alcohol use Yes     Comment: 18 beers and however much alcohol she can get     Allergies   Demerol; Latex; Penicillins; Strawberry extract; and Vicodin [hydrocodone-acetaminophen]   Review of Systems Review of Systems  Constitutional: Negative for chills.  HENT: Positive for congestion, ear pain, postnasal drip, sinus pain and sore throat.   Respiratory: Negative for shortness of breath.   Cardiovascular: Negative for chest pain.  Gastrointestinal: Negative for diarrhea, nausea and vomiting.  Genitourinary: Positive for vaginal discharge. Negative for vaginal bleeding and vaginal  pain.  Musculoskeletal: Negative for joint swelling.  Psychiatric/Behavioral: Negative for confusion.    Physical Exam Updated Vital Signs BP 125/78 (BP Location: Right Arm)   Pulse 68   Temp 98.6 F (37 C) (Oral)   Resp 16   Wt 87.5 kg (193 lb)   LMP 03/19/1985   SpO2 100%   BMI 28.71 kg/m   Physical Exam  Constitutional: She is oriented to person, place, and time. She appears well-developed and well-nourished. No distress.  HENT:  Head: Normocephalic and atraumatic.  Right Ear: Tympanic membrane normal. No middle ear effusion.  Left Ear: Tympanic membrane normal.  No middle ear effusion.  Mouth/Throat: Posterior oropharyngeal edema present. No oropharyngeal exudate or posterior oropharyngeal erythema.  Eyes: Conjunctivae are normal.  Cardiovascular: Normal rate.   Pulmonary/Chest: Effort normal. No respiratory distress. She has no wheezes. She has no rales.  Abdominal: She exhibits no  distension. There is no CVA tenderness.  Neurological: She is alert and oriented to person, place, and time.  Skin: Skin is warm and dry.  Nursing note and vitals reviewed. Pelvic exam showed mild white vaginal discharge. No cervical motion tenderness. No adnexal tenderness. ED Treatments / Results  DIAGNOSTIC STUDIES:  Oxygen Saturation is 100% on RA, normal by my interpretation.    COORDINATION OF CARE:  5:37 PM Discussed treatment plan with pt at bedside and pt agreed to plan.   Labs (all labs ordered are listed, but only abnormal results are displayed) Labs Reviewed  WET PREP, GENITAL - Abnormal; Notable for the following:       Result Value   Clue Cells Wet Prep HPF POC PRESENT (*)    WBC, Wet Prep HPF POC MANY (*)    All other components within normal limits  URINALYSIS, ROUTINE W REFLEX MICROSCOPIC  RPR  HIV ANTIBODY (ROUTINE TESTING)  GC/CHLAMYDIA PROBE AMP ([place]) NOT AT [facility]    EKG  EKG Interpretation None       Radiology No results  found.  Procedures Procedures (including critical care time)  Medications Ordered in ED Medications - No data to display   Initial Impression / Assessment and Plan / ED Course  I have reviewed the triage vital signs and the nursing notes.  Pertinent labs & imaging results that were available during my care of the patient were reviewed by me and considered in my medical decision making (see chart for details).     Patient with vaginal discharge. Also various other complaints but overall benign. No rash. Discharge home with Flagyl to treat bacterial vaginosis.  Final Clinical Impressions(s) / ED Diagnoses   Final diagnoses:  Bacterial vaginosis    New Prescriptions Discharge Medication List as of 04/07/2017  8:05 PM    START taking these medications   Details  metroNIDAZOLE (FLAGYL) 500 MG tablet Take 1 tablet (500 mg total) by mouth 2 (two) times daily., Starting Sat 04/07/2017, Print       I personally performed the services described in this documentation, which was scribed in my presence. The recorded information has been reviewed and is accurate.      Davonna Belling, MD 04/07/17 2340

## 2017-04-07 NOTE — ED Notes (Signed)
VS deferred. Pt states "she is ready to go".

## 2017-04-07 NOTE — ED Notes (Signed)
Pelvic Cart at bedside 

## 2017-04-07 NOTE — ED Notes (Signed)
Pt given d/c instructions as per chart. Rx x 1. Verbalizes understanding. No questions. 

## 2017-04-07 NOTE — ED Triage Notes (Signed)
Vaginal discharge x 1 week, sinus pressure with ear pain and bilateral leg pain.

## 2017-04-09 LAB — GC/CHLAMYDIA PROBE AMP (~~LOC~~) NOT AT ARMC
Chlamydia: NEGATIVE
Neisseria Gonorrhea: NEGATIVE

## 2017-04-09 LAB — HIV ANTIBODY (ROUTINE TESTING W REFLEX): HIV Screen 4th Generation wRfx: NONREACTIVE

## 2017-04-09 LAB — RPR: RPR Ser Ql: NONREACTIVE

## 2017-11-23 NOTE — Congregational Nurse Program (Signed)
Congregational Nurse Program Note  Date of Encounter: 11/22/2017  Past Medical History: Past Medical History:  Diagnosis Date  . Anxiety   . Arthritis   . Bipolar 1 disorder ([place])   . Depression     Encounter Details: CNP Questionnaire - 11/22/17 1100      Questionnaire   Patient Status  Not Applicable    Race  Black or African American    Location Patient Served At  Not Applicable    Insurance  Medicaid    Uninsured  Not Applicable    Food  Yes, have food insecurities;Within past 12 months, worried food would run out with no money to buy more;Within past 12 months, food ran out with no money to buy more    Housing/Utilities  No permanent housing;Worried about losing housing    Transportation  Yes, need transportation assistance;Provided transportation assistance (bus pass, taxi voucher, etc.)    Interpersonal Safety  No, do not feel physically and emotionally safe where you currently live    Medication  Yes, have medication insecurities    Referrals  Area Agency    ED Visit Averted  Not Applicable    Life-Saving Intervention Made  Not Applicable      States had an appointment for housing with [place] [facility].  Missed the appointment due to lack of transportation.  TC to this agency to assist client in rescheduling appointment.  Bus passes provided

## 2017-11-25 ENCOUNTER — Encounter (HOSPITAL_COMMUNITY): Payer: Self-pay

## 2017-11-25 ENCOUNTER — Emergency Department (HOSPITAL_COMMUNITY)
Admission: EM | Admit: 2017-11-25 | Discharge: 2017-11-26 | Disposition: A | Discharge status: Home / Self Care | Payer: Medicare Other | Attending: Physician Assistant | Admitting: Physician Assistant

## 2017-11-25 ENCOUNTER — Emergency Department (HOSPITAL_COMMUNITY): Payer: Medicare Other

## 2017-11-25 DIAGNOSIS — F1420 Cocaine dependence, uncomplicated: Secondary | ICD-10-CM | POA: Diagnosis not present

## 2017-11-25 DIAGNOSIS — Z79899 Other long term (current) drug therapy: Secondary | ICD-10-CM | POA: Insufficient documentation

## 2017-11-25 DIAGNOSIS — F419 Anxiety disorder, unspecified: Secondary | ICD-10-CM | POA: Insufficient documentation

## 2017-11-25 DIAGNOSIS — F319 Bipolar disorder, unspecified: Secondary | ICD-10-CM | POA: Diagnosis not present

## 2017-11-25 DIAGNOSIS — Z91040 Latex allergy status: Secondary | ICD-10-CM | POA: Diagnosis not present

## 2017-11-25 DIAGNOSIS — R05 Cough: Secondary | ICD-10-CM | POA: Diagnosis present

## 2017-11-25 DIAGNOSIS — F17210 Nicotine dependence, cigarettes, uncomplicated: Secondary | ICD-10-CM | POA: Diagnosis not present

## 2017-11-25 DIAGNOSIS — R059 Cough, unspecified: Secondary | ICD-10-CM

## 2017-11-25 MED ORDER — ACETAMINOPHEN 325 MG PO TABS: 650.0000 mg | ORAL_TABLET | Freq: Once | ORAL | Status: DC

## 2017-11-25 NOTE — ED Provider Notes (Signed)
[facility] EMERGENCY DEPARTMENT Provider Note   CSN: [id number] Arrival date & time: 11/25/17  1859     History   Chief Complaint Chief Complaint  Patient presents with  . Cough    HPI Katrina Kelley is a 62 y.o. female.  HPI  Very pleasant 62 year old female with history of bipolar, alcohol abuse, cocaine abuse, substance abuse, chronic undomiciled.  Presenting today with 2 days of cough.  Patient has slight cough and occasional pain with cough.  Congestion.  Mostly patient states however "I am cold and homeless and I do not want to be sent out in the cold".  Patient is hungry  Past Medical History:  Diagnosis Date  . Anxiety   . Arthritis   . Bipolar 1 disorder ([place])   . Depression     Patient Active Problem List   Diagnosis Date Noted  . Alcohol use disorder, moderate, dependence ([place]) 03/17/2016  . Cocaine use disorder, severe, dependence ([place]) 03/17/2016  . Substance induced mood disorder ([place]) 03/17/2016  . Chronic foot pain 02/07/2016  . Osteoarthrosis of ankle and foot 02/07/2016  . Appendicitis 03/12/2014  . Major depression, recurrent, chronic ([place]) 08/20/2012    Past Surgical History:  Procedure Laterality Date  . ABDOMINAL HYSTERECTOMY    . ELEVATION OF DEPRESSED SKULL FRACTURE    . FOOT ARTHRODESIS     rt and lt  . FRACTURE SURGERY  2005   fx both lower legs   . LAPAROSCOPIC APPENDECTOMY N/A 03/12/2014   Procedure: APPENDECTOMY LAPAROSCOPIC;  Surgeon: [name], MD;  Location: [facility];  Service: General;  Laterality: N/A;  . TOOTH EXTRACTION  11/27/2011   Procedure: DENTAL RESTORATION/EXTRACTIONS;  Surgeon: [name], DDS;  Location: [facility];  Service: Oral Surgery;  Laterality: N/A;  just extractions    OB History    No data available       Home Medications    Prior to Admission medications   Medication Sig Start Date End Date Taking? Authorizing Provider  calcium carbonate (TUMS - DOSED IN  MG ELEMENTAL CALCIUM) 500 MG chewable tablet Chew 2 tablets by mouth as needed for indigestion or heartburn. Reported on 03/16/2016    [provider]  cetirizine (ZYRTEC) 10 MG tablet Take 10 mg by mouth daily as needed for allergies.     [provider]  ergocalciferol (VITAMIN D2) 50000 UNITS capsule Take 50,000 Units by mouth once a week. monday    [provider]  fluticasone (FLONASE) 50 MCG/ACT nasal spray Place 1 spray into both nostrils as needed. For congestion 09/25/15   [provider]  hydrOXYzine (ATARAX/VISTARIL) 25 MG tablet Take 25 mg by mouth every 8 (eight) hours as needed for itching.  07/01/15   [provider]  meloxicam (MOBIC) 15 MG tablet Take 1 tablet (15 mg total) by mouth daily. 01/28/17   Khatri, Hina, PA-C  methocarbamol (ROBAXIN) 500 MG tablet Take 1 tablet (500 mg total) by mouth 2 (two) times daily. 01/28/17   Khatri, Hina, PA-C  metroNIDAZOLE (FLAGYL) 500 MG tablet Take 1 tablet (500 mg total) by mouth 2 (two) times daily. 04/07/17   Davonna Belling, MD  naproxen (NAPROSYN) 250 MG tablet Take 1 tablet (250 mg total) by mouth 2 (two) times daily with a meal. 10/20/16   [name], PA-C  oxyCODONE-acetaminophen (PERCOCET) 10-325 MG tablet Take 1 tablet by mouth every 8 (eight) hours as needed for pain. MAXIMUM TOTAL ACETAMINOPHEN DOSE IS 4000  MG PER DAY 10/20/16   [name], DPM  oxyCODONE-acetaminophen (PERCOCET/ROXICET) 5-325 MG tablet Take 2 tablets by mouth every 4 (four) hours as needed for severe pain. 08/31/16   [name], MD  pseudoephedrine (SUDAFED) 60 MG tablet Take 1 tablet (60 mg total) by mouth every 6 (six) hours as needed for congestion. 10/02/15   [name], PA-C  QUEtiapine (SEROQUEL) 100 MG tablet Take 100 mg by mouth daily.  07/02/15   [provider]  QUEtiapine (SEROQUEL) 100 MG tablet Take 1 tablet (100 mg total) by mouth at bedtime. 03/19/16   [name], NP  sertraline  (ZOLOFT) 25 MG tablet Take 25 mg by mouth daily.  07/02/15   [provider]  sertraline (ZOLOFT) 50 MG tablet Take 1 tablet (50 mg total) by mouth daily. 03/19/16   [name], NP  tiZANidine (ZANAFLEX) 4 MG tablet Take 1 tablet (4 mg total) by mouth every 6 (six) hours as needed for muscle spasms. 10/28/15   [name], PA-C  traZODone (DESYREL) 50 MG tablet Take 1 tablet (50 mg total) by mouth at bedtime as needed for sleep. 03/19/16   [name], NP    Family History Family History  Problem Relation Age of Onset  . Hyperlipidemia Son     Social History Social History   Tobacco Use  . Smoking status: Current Some Day Smoker    Packs/day: 0.25    Years: 38.00    Pack years: 9.50    Types: Cigarettes    Last attempt to quit: 03/22/2017    Years since quitting: 0.6  . Smokeless tobacco: Never Used  Substance Use Topics  . Alcohol use: Yes    Comment: 18 beers and however much alcohol she can get  . Drug use: Yes    Types: Cocaine    Comment: states quit alcohol and cocaine 10 years ago      Allergies   Demerol; Latex; Penicillins; Strawberry extract; and Vicodin [hydrocodone-acetaminophen]   Review of Systems Review of Systems  Constitutional: Negative for activity change.  HENT: Positive for congestion.   Respiratory: Positive for cough. Negative for shortness of breath.   Cardiovascular: Negative for chest pain.  Gastrointestinal: Negative for abdominal pain.  All other systems reviewed and are negative.    Physical Exam Updated Vital Signs BP 129/90 (BP Location: Right Arm)   Pulse 97   Temp 98.4 F (36.9 C) (Oral)   Ht 5\' 9"  (1.753 m)   Wt 88.5 kg (195 lb)   LMP 03/19/1985   SpO2 96%   BMI 28.80 kg/m   Physical Exam  Constitutional: She is oriented to person, place, and time. She appears well-developed and well-nourished.  HENT:  Head: Normocephalic and atraumatic.  Eyes: Right eye exhibits no discharge. Left eye exhibits no discharge.    Cardiovascular: Normal rate, regular rhythm and normal heart sounds.  No murmur heard. Pulmonary/Chest: Effort normal and breath sounds normal. She has no wheezes. She has no rales.  Abdominal: Soft. She exhibits no distension. There is no tenderness.  Neurological: She is oriented to person, place, and time.  Skin: Skin is warm and dry. She is not diaphoretic.  Psychiatric: She has a normal mood and affect.  Nursing note and vitals reviewed.    ED Treatments / Results  Labs (all labs ordered are listed, but only abnormal results are displayed) Labs Reviewed - No data to display  EKG  EKG Interpretation None  Radiology Dg Chest 2 View  Result Date: 11/25/2017 CLINICAL DATA:  Weakness, cough and dyspnea with mid chest pain x1 week. EXAM: CHEST  2 VIEW COMPARISON:  08/31/2016 FINDINGS: The heart size and mediastinal contours are within normal limits. Aortic atherosclerosis along the transverse portion is redemonstrated. No aneurysm. Emphysematous hyperinflation of the lungs, upper lobe predominant with crowding of lower lobe interstitial lung markings. No pneumonic consolidation, effusion or CHF. No pneumothorax. Mild stable degenerative change along the upper thoracic spine. IMPRESSION: Emphysematous hyperinflation of the lungs. No active pulmonary disease. Electronically Signed   By: Ashley Royalty M.D.   On: 11/25/2017 20:00    Procedures Procedures (including critical care time)  Medications Ordered in ED Medications  acetaminophen (TYLENOL) tablet 650 mg (not administered)     Initial Impression / Assessment and Plan / ED Course  I have reviewed the triage vital signs and the nursing notes.  Pertinent labs & imaging results that were available during my care of the patient were reviewed by me and considered in my medical decision making (see chart for details).     Very pleasant 62 year old female with history of bipolar, alcohol abuse, cocaine abuse, substance  abuse, chronic undomiciled.  Presenting today with 2 days of cough.  Patient has slight cough and occasional pain with cough.  Congestion.  Mostly patient states however "I am cold and homeless and I do not want to be sent out in the cold".  Patient is hungry  8:32 PM Patient appears well normal vital signs and normal physical exam.  Will give her food, fresh socks and discuss with nursing to get her somewhere warm to the night.    Final Clinical Impressions(s) / ED Diagnoses   Final diagnoses:  None    ED Discharge Orders    None       Macarthur Critchley, MD 11/25/17 2034

## 2017-11-25 NOTE — ED Notes (Signed)
Pt provided Ginger ale x 2 and sandwich

## 2017-11-25 NOTE — ED Triage Notes (Signed)
Per [facility], Pt has been having a productive cough for the last three days along with right ear pain and swollen glands. Reports not feeling well for the last week. Denies any fever. Vitals: 99 Temp, 160/92, 96% on RA, 18 RR, 84 HR.

## 2017-11-25 NOTE — Discharge Instructions (Signed)
Likely this is viral, please follow up with PCP as needed

## 2017-11-25 NOTE — ED Notes (Signed)
Pt transported to Xray. 

## 2017-11-26 NOTE — ED Notes (Signed)
In to d/c patient, pt not in room at this time, gown on bed and belongings removed.

## 2017-11-26 NOTE — ED Notes (Signed)
Pt c/o nausea, requesting ice water

## 2018-01-28 ENCOUNTER — Other Ambulatory Visit: Payer: Self-pay

## 2018-01-28 ENCOUNTER — Emergency Department (HOSPITAL_COMMUNITY)
Admission: EM | Admit: 2018-01-28 | Discharge: 2018-01-29 | Disposition: A | Discharge status: Home / Self Care | Payer: Medicare Other | Attending: Emergency Medicine | Admitting: Emergency Medicine

## 2018-01-28 ENCOUNTER — Encounter (HOSPITAL_COMMUNITY): Payer: Self-pay | Admitting: Emergency Medicine

## 2018-01-28 DIAGNOSIS — F319 Bipolar disorder, unspecified: Secondary | ICD-10-CM | POA: Insufficient documentation

## 2018-01-28 DIAGNOSIS — Y939 Activity, unspecified: Secondary | ICD-10-CM | POA: Diagnosis not present

## 2018-01-28 DIAGNOSIS — T24012A Burn of unspecified degree of left thigh, initial encounter: Secondary | ICD-10-CM | POA: Insufficient documentation

## 2018-01-28 DIAGNOSIS — F419 Anxiety disorder, unspecified: Secondary | ICD-10-CM | POA: Insufficient documentation

## 2018-01-28 DIAGNOSIS — Z915 Personal history of self-harm: Secondary | ICD-10-CM | POA: Diagnosis not present

## 2018-01-28 DIAGNOSIS — X110XXA Contact with hot water in bath or tub, initial encounter: Secondary | ICD-10-CM | POA: Insufficient documentation

## 2018-01-28 DIAGNOSIS — F14151 Cocaine abuse with cocaine-induced psychotic disorder with hallucinations: Secondary | ICD-10-CM | POA: Diagnosis not present

## 2018-01-28 DIAGNOSIS — F1414 Cocaine abuse with cocaine-induced mood disorder: Secondary | ICD-10-CM | POA: Diagnosis present

## 2018-01-28 DIAGNOSIS — Z0289 Encounter for other administrative examinations: Secondary | ICD-10-CM | POA: Diagnosis not present

## 2018-01-28 DIAGNOSIS — Y999 Unspecified external cause status: Secondary | ICD-10-CM | POA: Diagnosis not present

## 2018-01-28 DIAGNOSIS — T310 Burns involving less than 10% of body surface: Secondary | ICD-10-CM | POA: Insufficient documentation

## 2018-01-28 DIAGNOSIS — Z79899 Other long term (current) drug therapy: Secondary | ICD-10-CM | POA: Diagnosis not present

## 2018-01-28 DIAGNOSIS — Y929 Unspecified place or not applicable: Secondary | ICD-10-CM | POA: Insufficient documentation

## 2018-01-28 DIAGNOSIS — F1910 Other psychoactive substance abuse, uncomplicated: Secondary | ICD-10-CM | POA: Insufficient documentation

## 2018-01-28 DIAGNOSIS — F14159 Cocaine abuse with cocaine-induced psychotic disorder, unspecified: Secondary | ICD-10-CM

## 2018-01-28 DIAGNOSIS — T300 Burn of unspecified body region, unspecified degree: Secondary | ICD-10-CM

## 2018-01-28 DIAGNOSIS — Z008 Encounter for other general examination: Secondary | ICD-10-CM

## 2018-01-28 DIAGNOSIS — T24011A Burn of unspecified degree of right thigh, initial encounter: Secondary | ICD-10-CM | POA: Diagnosis not present

## 2018-01-28 DIAGNOSIS — R440 Auditory hallucinations: Secondary | ICD-10-CM | POA: Insufficient documentation

## 2018-01-28 DIAGNOSIS — Z23 Encounter for immunization: Secondary | ICD-10-CM | POA: Insufficient documentation

## 2018-01-28 DIAGNOSIS — X12XXXA Contact with other hot fluids, initial encounter: Secondary | ICD-10-CM

## 2018-01-28 DIAGNOSIS — F17210 Nicotine dependence, cigarettes, uncomplicated: Secondary | ICD-10-CM | POA: Insufficient documentation

## 2018-01-28 DIAGNOSIS — F329 Major depressive disorder, single episode, unspecified: Secondary | ICD-10-CM | POA: Insufficient documentation

## 2018-01-28 LAB — COMPREHENSIVE METABOLIC PANEL
ALBUMIN: 3.5 g/dL (ref 3.5–5.0)
ALK PHOS: 58 U/L (ref 38–126)
ALT: 28 U/L (ref 14–54)
ANION GAP: 9 (ref 5–15)
AST: 42 U/L — ABNORMAL HIGH (ref 15–41)
BILIRUBIN TOTAL: 0.9 mg/dL (ref 0.3–1.2)
BUN: 14 mg/dL (ref 6–20)
CALCIUM: 8.9 mg/dL (ref 8.9–10.3)
CO2: 23 mmol/L (ref 22–32)
CREATININE: 0.8 mg/dL (ref 0.44–1.00)
Chloride: 106 mmol/L (ref 101–111)
GFR calc non Af Amer: >60 mL/min (ref >60)
GLUCOSE: 96 mg/dL (ref 65–99)
Potassium: 3.8 mmol/L (ref 3.5–5.1)
SODIUM: 138 mmol/L (ref 135–145)
TOTAL PROTEIN: 7.9 g/dL (ref 6.5–8.1)

## 2018-01-28 LAB — URINALYSIS, ROUTINE W REFLEX MICROSCOPIC
Bilirubin Urine: NEGATIVE
GLUCOSE, UA: NEGATIVE mg/dL
KETONES UR: NEGATIVE mg/dL
NITRITE: NEGATIVE
PH: 5 (ref 5.0–8.0)
Protein, ur: NEGATIVE mg/dL
Specific Gravity, Urine: 1.025 (ref 1.005–1.030)

## 2018-01-28 LAB — CBC
HCT: 40.7 % (ref 36.0–46.0)
Hemoglobin: 13.9 g/dL (ref 12.0–15.0)
MCH: 31.4 pg (ref 26.0–34.0)
MCHC: 34.2 g/dL (ref 30.0–36.0)
MCV: 92.1 fL (ref 78.0–100.0)
Platelets: 214 10*3/uL (ref 150–400)
RBC: 4.42 MIL/uL (ref 3.87–5.11)
RDW: 14.4 % (ref 11.5–15.5)
WBC: 5 10*3/uL (ref 4.0–10.5)

## 2018-01-28 LAB — RAPID URINE DRUG SCREEN, HOSP PERFORMED
AMPHETAMINES: NOT DETECTED
BENZODIAZEPINES: NOT DETECTED
Barbiturates: NOT DETECTED
Cocaine: POSITIVE — AB
Opiates: NOT DETECTED
TETRAHYDROCANNABINOL: POSITIVE — AB

## 2018-01-28 LAB — ETHANOL: Alcohol, Ethyl (B): <10 mg/dL (ref <10)

## 2018-01-28 LAB — ACETAMINOPHEN LEVEL

## 2018-01-28 LAB — SALICYLATE LEVEL: Salicylate Lvl: <7 mg/dL (ref 2.8–30.0)

## 2018-01-28 MED ORDER — ALUM & MAG HYDROXIDE-SIMETH 200-200-20 MG/5ML PO SUSP: 30.0000 mL | Freq: Four times a day (QID) | ORAL | Status: DC | PRN

## 2018-01-28 MED ORDER — TETANUS-DIPHTH-ACELL PERTUSSIS 5-2.5-18.5 LF-MCG/0.5 IM SUSP
0.5000 mL | Freq: Once | INTRAMUSCULAR | Status: AC
  Administered 2018-01-28: 0.5 mL via INTRAMUSCULAR
  Filled 2018-01-28: qty 0.5

## 2018-01-28 MED ORDER — BACITRACIN ZINC 500 UNIT/GM EX OINT
TOPICAL_OINTMENT | CUTANEOUS | Status: AC
  Administered 2018-01-28: 5
  Filled 2018-01-28: qty 2.7

## 2018-01-28 MED ORDER — ZOLPIDEM TARTRATE 5 MG PO TABS
5.0000 mg | ORAL_TABLET | Freq: Every evening | ORAL | Status: DC | PRN
Start: 2018-01-28 — End: 2018-01-29

## 2018-01-28 MED ORDER — ONDANSETRON HCL 4 MG PO TABS: 4.0000 mg | ORAL_TABLET | Freq: Three times a day (TID) | ORAL | Status: DC | PRN

## 2018-01-28 MED ORDER — NICOTINE 21 MG/24HR TD PT24: 21.0000 mg | MEDICATED_PATCH | Freq: Every day | TRANSDERMAL | Status: DC

## 2018-01-28 NOTE — ED Notes (Signed)
Bed: [facility] Expected date:  Expected time:  Means of arrival:  Comments: Patient from [facility]
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
## 2018-01-28 NOTE — ED Notes (Signed)
[facility] called and said they are not accepting this patient back to the facility
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
## 2018-01-28 NOTE — ED Notes (Signed)
Pt escorted by TTS to conference room for assessment.

## 2018-01-28 NOTE — ED Notes (Signed)
Lab results faxed to [facility] for evaluation and disposition.

## 2018-01-29 DIAGNOSIS — F17210 Nicotine dependence, cigarettes, uncomplicated: Secondary | ICD-10-CM | POA: Diagnosis not present

## 2018-01-29 DIAGNOSIS — F14151 Cocaine abuse with cocaine-induced psychotic disorder with hallucinations: Secondary | ICD-10-CM | POA: Diagnosis not present

## 2018-01-29 DIAGNOSIS — Z0289 Encounter for other administrative examinations: Secondary | ICD-10-CM | POA: Diagnosis not present

## 2018-01-29 DIAGNOSIS — Z915 Personal history of self-harm: Secondary | ICD-10-CM | POA: Diagnosis not present

## 2018-01-29 DIAGNOSIS — F419 Anxiety disorder, unspecified: Secondary | ICD-10-CM | POA: Diagnosis not present

## 2018-01-29 DIAGNOSIS — F14159 Cocaine abuse with cocaine-induced psychotic disorder, unspecified: Secondary | ICD-10-CM

## 2018-01-29 DIAGNOSIS — R440 Auditory hallucinations: Secondary | ICD-10-CM

## 2018-01-29 DIAGNOSIS — F1414 Cocaine abuse with cocaine-induced mood disorder: Secondary | ICD-10-CM | POA: Diagnosis present

## 2018-01-29 DIAGNOSIS — R450 Nervousness: Secondary | ICD-10-CM

## 2018-01-29 MED ORDER — GABAPENTIN 300 MG PO CAPS
300.0000 mg | ORAL_CAPSULE | Freq: Two times a day (BID) | ORAL | Status: DC
  Administered 2018-01-29: 300 mg via ORAL
  Filled 2018-01-29: qty 1

## 2018-01-29 MED ORDER — CARBAMAZEPINE 200 MG PO TABS
200.0000 mg | ORAL_TABLET | Freq: Two times a day (BID) | ORAL | Status: DC
  Administered 2018-01-29: 200 mg via ORAL
  Filled 2018-01-29: qty 1

## 2018-01-29 MED ORDER — QUETIAPINE FUMARATE 100 MG PO TABS
100.0000 mg | ORAL_TABLET | Freq: Every day | ORAL | Status: DC
Start: 2018-01-29 — End: 2018-01-29

## 2018-01-29 NOTE — ED Notes (Signed)
Pt admitted to room #41, pt reports mental health hx of schizophrenia and that she has been off her medication regimen for two weeks.  Pt reports she is homeless and has been self medicating with street drugs.Pt reports Katrina Kelley is her outpatient provider. Pt endorsing SI and AH. Encouragement and support provided. Special checks q 15 mins in place for safety, Video monitoring in place. Will continue to monitor.

## 2018-01-29 NOTE — ED Notes (Signed)
Sheriff on unit to transfer pt back to [place]. Personal property given to sheriff for transport. Pt ambulatory off unit in law enforcement custody.

## 2018-01-29 NOTE — ED Notes (Signed)
Moved to [facility]

## 2018-01-29 NOTE — BH Assessment (Signed)
[facility] Assessment Progress Note  At 11:49 this Probation officer called Warden/ranger and spoke to [place].  She confirms that they are prepared to have this pt returned to them, as long as she has wound care information with her.  This has been staffed with Ethelene Hal, FNP and with pt's nurse, Caryl Pina.  Pt was transported to [facility] by [facility], who also should be contacted to facilitate return.  Jalene Mullet, [place] Coordinator [phone]

## 2018-01-29 NOTE — ED Notes (Signed)
Bed: [facility] Expected date:  Expected time:  Means of arrival:  Comments: Venita Sheffield

## 2018-01-29 NOTE — ED Notes (Signed)
Pt talking on hallway phone.  
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
## 2018-02-03 ENCOUNTER — Emergency Department (HOSPITAL_COMMUNITY)
Admission: EM | Admit: 2018-02-03 | Discharge: 2018-02-03 | Disposition: A | Discharge status: Home / Self Care | Payer: Medicare Other | Attending: Emergency Medicine | Admitting: Emergency Medicine

## 2018-02-03 ENCOUNTER — Other Ambulatory Visit: Payer: Self-pay

## 2018-02-03 ENCOUNTER — Encounter (HOSPITAL_COMMUNITY): Payer: Self-pay

## 2018-02-03 DIAGNOSIS — Z79899 Other long term (current) drug therapy: Secondary | ICD-10-CM | POA: Insufficient documentation

## 2018-02-03 DIAGNOSIS — N3000 Acute cystitis without hematuria: Secondary | ICD-10-CM | POA: Diagnosis not present

## 2018-02-03 DIAGNOSIS — F17210 Nicotine dependence, cigarettes, uncomplicated: Secondary | ICD-10-CM | POA: Insufficient documentation

## 2018-02-03 DIAGNOSIS — R300 Dysuria: Secondary | ICD-10-CM | POA: Diagnosis present

## 2018-02-03 DIAGNOSIS — Z91040 Latex allergy status: Secondary | ICD-10-CM | POA: Insufficient documentation

## 2018-02-03 LAB — URINALYSIS, ROUTINE W REFLEX MICROSCOPIC
Bilirubin Urine: NEGATIVE
Glucose, UA: NEGATIVE mg/dL
HGB URINE DIPSTICK: NEGATIVE
Ketones, ur: NEGATIVE mg/dL
NITRITE: NEGATIVE
PROTEIN: NEGATIVE mg/dL
SPECIFIC GRAVITY, URINE: 1.017 (ref 1.005–1.030)
pH: 6 (ref 5.0–8.0)

## 2018-02-03 MED ORDER — CEPHALEXIN 500 MG PO CAPS: 500.0000 mg | ORAL_CAPSULE | Freq: Three times a day (TID) | ORAL | 0 refills | Status: AC

## 2018-02-03 MED ORDER — CEPHALEXIN 500 MG PO CAPS
500.0000 mg | ORAL_CAPSULE | Freq: Once | ORAL | Status: AC
  Administered 2018-02-03: 500 mg via ORAL
  Filled 2018-02-03: qty 1

## 2018-02-03 NOTE — ED Provider Notes (Signed)
[place] DEPT Provider Note   CSN: [id number] Arrival date & time: 02/03/18  1454     History   Chief Complaint Chief Complaint  Patient presents with  . Urinary Tract Infection    HPI Katrina Kelley is a 62 y.o. female.  Katrina Kelley is a 62 y.o. Female with a history of anxiety, depression, and bipolar 1 disorder, presents to the ED from [facility] for evaluation of UTI patient reports she is been taking antibiotics for the past 3 days but her symptoms are not improving.  Patient reports she continues to experience burning with urination, urinary frequency and bladder pressure.  Patient denies any abdominal pain, flank pain, nausea, vomiting, diarrhea, fevers or chills. Pt has been on bactrim. Denies CP or SOB. No other new symptoms.       Past Medical History:  Diagnosis Date  . Anxiety   . Arthritis   . Bipolar 1 disorder ([place])   . Depression     Patient Active Problem List   Diagnosis Date Noted  . Cocaine abuse with cocaine-induced mood disorder ([place]) 01/29/2018  . 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 Alcohol use disorder, moderate, dependence ([place]) 03/17/2016  . Cocaine use disorder, severe, dependence ([place]) 03/17/2016  . Substance induced mood disorder ([place]) 03/17/2016  . Chronic foot pain 02/07/2016  . Osteoarthrosis of ankle and foot 02/07/2016  . Appendicitis 03/12/2014  . Major depression, recurrent, chronic ([place]) 08/20/2012    Past Surgical History:  Procedure Laterality Date  . ABDOMINAL HYSTERECTOMY    . ELEVATION OF DEPRESSED SKULL FRACTURE    . FOOT ARTHRODESIS     rt and lt  . FRACTURE SURGERY  2005   fx both lower legs   . LAPAROSCOPIC APPENDECTOMY N/A 03/12/2014   Procedure: APPENDECTOMY LAPAROSCOPIC;  Surgeon: [name], MD;  Location: [place];  Service: General;  Laterality: N/A;  . TOOTH EXTRACTION  11/27/2011   Procedure: DENTAL RESTORATION/EXTRACTIONS;   Surgeon: [name], DDS;  Location: [place];  Service: Oral Surgery;  Laterality: N/A;  just extractions     OB History   None      Home Medications    Prior to Admission medications   Medication Sig Start Date End Date Taking? Authorizing Provider  carbamazepine (TEGRETOL) 200 MG tablet Take 200 mg by mouth 2 (two) times daily.    [provider]  meloxicam (MOBIC) 15 MG tablet Take 1 tablet (15 mg total) by mouth daily. Patient not taking: Reported on 11/25/2017 01/28/17   Delia Heady, PA-C  methocarbamol (ROBAXIN) 500 MG tablet Take 1 tablet (500 mg total) by mouth 2 (two) times daily. Patient not taking: Reported on 11/25/2017 01/28/17   Delia Heady, PA-C  naproxen (NAPROSYN) 250 MG tablet Take 1 tablet (250 mg total) by mouth 2 (two) times daily with a meal. Patient not taking: Reported on 11/25/2017 10/20/16   [name], PA-C  oxyCODONE-acetaminophen (PERCOCET) 10-325 MG tablet Take 1 tablet by mouth every 8 (eight) hours as needed for pain. MAXIMUM TOTAL ACETAMINOPHEN DOSE IS 4000 MG PER DAY Patient not taking: Reported on 11/25/2017 10/20/16   [name], DPM  oxyCODONE-acetaminophen (PERCOCET/ROXICET) 5-325 MG tablet Take 2 tablets by mouth every 4 (four) hours as needed for severe pain. Patient not taking: Reported on 11/25/2017 08/31/16   [name], MD  pseudoephedrine (SUDAFED) 60 MG tablet Take 1 tablet (60 mg total) by mouth every 6 (six) hours  as needed for congestion. Patient not taking: Reported on 11/25/2017 10/02/15   [name], PA-C  QUEtiapine (SEROQUEL) 100 MG tablet Take 1 tablet (100 mg total) by mouth at bedtime. Patient taking differently: Take 200 mg by mouth at bedtime.  03/19/16   [name], NP  sertraline (ZOLOFT) 50 MG tablet Take 1 tablet (50 mg total) by mouth daily. 03/19/16   [name], NP  tiZANidine (ZANAFLEX) 4 MG tablet Take 1 tablet (4 mg total) by mouth every 6 (six) hours as needed for muscle  spasms. Patient not taking: Reported on 11/25/2017 10/28/15   [name], PA-C  traZODone (DESYREL) 50 MG tablet Take 1 tablet (50 mg total) by mouth at bedtime as needed for sleep. Patient not taking: Reported on 11/25/2017 03/19/16   [name], NP    Family History Family History  Problem Relation Age of Onset  . Hyperlipidemia Son     Social History Social History   Tobacco Use  . Smoking status: Current Some Day Smoker    Packs/day: 0.25    Years: 38.00    Pack years: 9.50    Types: Cigarettes    Last attempt to quit: 03/22/2017    Years since quitting: 0.8  . Smokeless tobacco: Never Used  Substance Use Topics  . Alcohol use: Yes    Comment: 18 beers and however much alcohol she can get  . Drug use: Yes    Types: Cocaine    Comment: states quit alcohol and cocaine 10 years ago      Allergies   Demerol; Latex; Penicillins; Strawberry extract; and Vicodin [hydrocodone-acetaminophen]   Review of Systems Review of Systems  Constitutional: Negative for chills and fever.  HENT: Negative for congestion, rhinorrhea and sore throat.   Eyes: Negative for visual disturbance.  Respiratory: Negative for cough and shortness of breath.   Cardiovascular: Negative for chest pain.  Gastrointestinal: Negative for abdominal pain, nausea and vomiting.  Genitourinary: Positive for dysuria and frequency. Negative for flank pain, pelvic pain, vaginal bleeding and vaginal discharge.  Musculoskeletal: Negative for arthralgias, back pain and myalgias.  Skin: Negative for color change and rash.  Neurological: Negative for dizziness, weakness and light-headedness.     Physical Exam Updated Vital Signs BP 111/87 (BP Location: Left Arm)   Pulse 70   Temp 98.2 F (36.8 C)   Resp 16   Ht 5\' 9"  (1.753 m)   Wt 87.5 kg (193 lb)   LMP 03/19/1985   SpO2 100%   BMI 28.50 kg/m   Physical Exam  Constitutional: She is oriented to person, place, and time. She appears well-developed and  well-nourished. No distress.  HENT:  Head: Normocephalic and atraumatic.  Mouth/Throat: Oropharynx is clear and moist.  Eyes: Right eye exhibits no discharge. Left eye exhibits no discharge.  Neck: Neck supple.  Cardiovascular: Normal rate, regular rhythm, normal heart sounds and intact distal pulses.  Pulmonary/Chest: Effort normal and breath sounds normal. No stridor. No respiratory distress. She has no wheezes. She has no rales.  Respirations equal and unlabored, patient able to speak in full sentences, lungs clear to auscultation bilaterally  Abdominal: Soft. Bowel sounds are normal. She exhibits no distension and no mass. There is no tenderness. There is no guarding.  Abdomen soft, bowel sounds present throughout, nontender to palpation in all quadrants without guarding or peritoneal signs, no CVA tenderness bilaterally  Musculoskeletal: She exhibits no edema or deformity.  Neurological: She is alert and oriented to person,  place, and time. Coordination normal.  Skin: Skin is warm and dry. Capillary refill takes less than 2 seconds. She is not diaphoretic.  Psychiatric: She has a normal mood and affect. Her behavior is normal.  Nursing note and vitals reviewed.    ED Treatments / Results  Labs (all labs ordered are listed, but only abnormal results are displayed) Labs Reviewed  URINALYSIS, ROUTINE W REFLEX MICROSCOPIC - Abnormal; Notable for the following components:      Result Value   APPearance CLOUDY (*)    Leukocytes, UA LARGE (*)    Bacteria, UA RARE (*)    Squamous Epithelial / LPF 0-5 (*)    All other components within normal limits  URINE CULTURE    EKG None  Radiology No results found.  Procedures Procedures (including critical care time)  Medications Ordered in ED Medications  cephALEXin (KEFLEX) capsule 500 mg (500 mg Oral Given 02/03/18 1700)     Initial Impression / Assessment and Plan / ED Course  I have reviewed the triage vital signs and the  nursing notes.  Pertinent labs & imaging results that were available during my care of the patient were reviewed by me and considered in my medical decision making (see chart for details).  Patient presents with worsening urinary tract infection, she reports she is continuing to experience bladder pressure, urinary frequency and dysuria.  She denies any flank pain, abdominal pain, fevers or chills, nausea or vomiting.  Patient afebrile with normal vitals here today.  When compared to 4/15 the urine does appear slightly worse with large leukocytes and too numerous to count WBCs with rare bacteria present, patient was previously on Bactrim, urine culture sent today, will transition patient to Keflex as it has better coverage here in the area.  Patient given first dose of Keflex here in the ED and tolerated well.  Will discharge back to [facility] with prescription for antibiotics.  Strict return precautions discussed.  Patient expresses understanding and is in agreement with plan.  Pt discussed with Dr. Tomi Bamberger who agrees with plan.  Final Clinical Impressions(s) / ED Diagnoses   Final diagnoses:  Acute cystitis without hematuria    ED Discharge Orders        Ordered    cephALEXin (KEFLEX) 500 MG capsule  3 times daily     02/03/18 1735       Jacqlyn Larsen, PA-C 02/03/18 1736    Dorie Rank, MD 02/06/18 [phone]

## 2018-02-03 NOTE — ED Notes (Signed)
No adverse reaction noted to the Keflex. Non-emergency [facility] called to transport pt back to [facility].

## 2018-02-03 NOTE — Discharge Instructions (Signed)
Please begin taking new antibiotic 3 times daily.  You have a urine culture pending and will be contacted if a different antibiotic is more appropriate.  If you develop fevers or chills, flank pain, abdominal pain, nausea, vomiting or other new or concerning symptoms please return to the ED for reevaluation.

## 2018-02-03 NOTE — ED Triage Notes (Signed)
Pt recently seen for UTI. States that the abx she has been taking have not been effective. Coming from [place]

## 2018-02-03 NOTE — ED Notes (Signed)
Bed: [facility]PT4 Expected date:  Expected time:  Means of arrival:  Comments: 

## 2018-02-05 LAB — URINE CULTURE: Culture: <10000 — AB

## 2018-02-06 ENCOUNTER — Other Ambulatory Visit: Payer: Self-pay

## 2018-02-06 ENCOUNTER — Encounter (HOSPITAL_COMMUNITY): Payer: Self-pay | Admitting: Emergency Medicine

## 2018-02-06 DIAGNOSIS — Z91040 Latex allergy status: Secondary | ICD-10-CM | POA: Diagnosis not present

## 2018-02-06 DIAGNOSIS — F250 Schizoaffective disorder, bipolar type: Secondary | ICD-10-CM | POA: Insufficient documentation

## 2018-02-06 DIAGNOSIS — Z79899 Other long term (current) drug therapy: Secondary | ICD-10-CM | POA: Insufficient documentation

## 2018-02-06 DIAGNOSIS — F17210 Nicotine dependence, cigarettes, uncomplicated: Secondary | ICD-10-CM | POA: Insufficient documentation

## 2018-02-06 DIAGNOSIS — N898 Other specified noninflammatory disorders of vagina: Secondary | ICD-10-CM | POA: Insufficient documentation

## 2018-02-06 NOTE — ED Triage Notes (Signed)
Pt sent here from [facility] to have a STD workup, pap smear, evaluation of greenish,yellow vaginal discharge   Pt states it is feeling raw in her vaginal area  Denies odor  Pt ws recently started on antibiotics for a UTI

## 2018-02-07 ENCOUNTER — Emergency Department (HOSPITAL_COMMUNITY)
Admission: EM | Admit: 2018-02-07 | Discharge: 2018-02-07 | Disposition: A | Discharge status: Home / Self Care | Payer: Medicare Other | Attending: Emergency Medicine | Admitting: Emergency Medicine

## 2018-02-07 DIAGNOSIS — N898 Other specified noninflammatory disorders of vagina: Secondary | ICD-10-CM

## 2018-02-07 HISTORY — DX: Schizoaffective disorder, unspecified: F25.9

## 2018-02-07 LAB — URINALYSIS, ROUTINE W REFLEX MICROSCOPIC
BILIRUBIN URINE: NEGATIVE
Glucose, UA: NEGATIVE mg/dL
Ketones, ur: NEGATIVE mg/dL
NITRITE: NEGATIVE
PH: 6 (ref 5.0–8.0)
Protein, ur: NEGATIVE mg/dL
SPECIFIC GRAVITY, URINE: 1.015 (ref 1.005–1.030)
WBC, UA: >50 WBC/hpf — ABNORMAL HIGH (ref 0–5)

## 2018-02-07 LAB — WET PREP, GENITAL
CLUE CELLS WET PREP: NONE SEEN
SPERM: NONE SEEN
TRICH WET PREP: NONE SEEN
Yeast Wet Prep HPF POC: NONE SEEN

## 2018-02-07 MED ORDER — AZITHROMYCIN 250 MG PO TABS
1000.0000 mg | ORAL_TABLET | Freq: Once | ORAL | Status: AC
  Administered 2018-02-07: 1000 mg via ORAL
  Filled 2018-02-07: qty 4

## 2018-02-07 MED ORDER — CEFTRIAXONE SODIUM 250 MG IJ SOLR
250.0000 mg | Freq: Once | INTRAMUSCULAR | Status: AC
  Administered 2018-02-07: 250 mg via INTRAMUSCULAR
  Filled 2018-02-07: qty 250

## 2018-02-07 NOTE — ED Provider Notes (Signed)
[place] DEPT Provider Note   CSN: [id number] Arrival date & time: 02/06/18  1927     History   Chief Complaint Chief Complaint  Patient presents with  . Vaginal Discharge    HPI Katrina Kelley is a 62 y.o. female.  Katrina Kelley is a 62 y.o. Female with a history of bipolar 1 disorder, schizoaffective disorder, presents to the ED from [facility] crisis center for evaluation of vaginal discharge.  Patient reports for the past 3 days she has had "greenish discharge".  Patient reports vaginal irritation but denies any pelvic pain.  Patient was recently seen and treated for UTI which she reports is improving she reports dysuria and frequency has completely resolved, she continues to take Keflex 3 times daily as directed.  She denies any fevers, chills, abdominal pain, flank pain, nausea or vomiting.  She denies any diarrhea, melena or hematochezia.  She denies any abnormal vaginal bleeding, patient had a hysterectomy.  Patient reports she was last sexually active about 2 weeks ago is unsure if she could have been exposed to any STDs.     Past Medical History:  Diagnosis Date  . Anxiety   . Arthritis   . Bipolar 1 disorder ([place])   . Depression   . Schizoaffective disorder [facility])     Patient Active Problem List   Diagnosis Date Noted  . Cocaine abuse with cocaine-induced mood disorder ([place]) 01/29/2018  . 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 Alcohol use disorder, moderate, dependence ([place]) 03/17/2016  . Cocaine use disorder, severe, dependence ([place]) 03/17/2016  . Substance induced mood disorder ([place]) 03/17/2016  . Chronic foot pain 02/07/2016  . Osteoarthrosis of ankle and foot 02/07/2016  . Appendicitis 03/12/2014  . Major depression, recurrent, chronic ([place]) 08/20/2012    Past Surgical History:  Procedure Laterality Date  . ABDOMINAL HYSTERECTOMY    . ELEVATION OF DEPRESSED SKULL FRACTURE    .  FOOT ARTHRODESIS     rt and lt  . FRACTURE SURGERY  2005   fx both lower legs   . LAPAROSCOPIC APPENDECTOMY N/A 03/12/2014   Procedure: APPENDECTOMY LAPAROSCOPIC;  Surgeon: [name], MD;  Location: [place];  Service: General;  Laterality: N/A;  . TOOTH EXTRACTION  11/27/2011   Procedure: DENTAL RESTORATION/EXTRACTIONS;  Surgeon: [name], DDS;  Location: [place];  Service: Oral Surgery;  Laterality: N/A;  just extractions     OB History   None      Home Medications    Prior to Admission medications   Medication Sig Start Date End Date Taking? Authorizing Provider  carbamazepine (TEGRETOL) 200 MG tablet Take 200 mg by mouth 2 (two) times daily.    [provider]  cephALEXin (KEFLEX) 500 MG capsule Take 1 capsule (500 mg total) by mouth 3 (three) times daily for 7 days. 02/03/18 02/10/18  Jacqlyn Larsen, PA-C  meloxicam (MOBIC) 15 MG tablet Take 1 tablet (15 mg total) by mouth daily. Patient not taking: Reported on 11/25/2017 01/28/17   Delia Heady, PA-C  methocarbamol (ROBAXIN) 500 MG tablet Take 1 tablet (500 mg total) by mouth 2 (two) times daily. Patient not taking: Reported on 11/25/2017 01/28/17   Delia Heady, PA-C  naproxen (NAPROSYN) 250 MG tablet Take 1 tablet (250 mg total) by mouth 2 (two) times daily with a meal. Patient not taking: Reported on 11/25/2017 10/20/16   [name], PA-C  oxyCODONE-acetaminophen (PERCOCET) 10-325 MG tablet Take 1  tablet by mouth every 8 (eight) hours as needed for pain. MAXIMUM TOTAL ACETAMINOPHEN DOSE IS 4000 MG PER DAY Patient not taking: Reported on 11/25/2017 10/20/16   [name], DPM  oxyCODONE-acetaminophen (PERCOCET/ROXICET) 5-325 MG tablet Take 2 tablets by mouth every 4 (four) hours as needed for severe pain. Patient not taking: Reported on 11/25/2017 08/31/16   [name], MD  pseudoephedrine (SUDAFED) 60 MG tablet Take 1 tablet (60 mg total) by mouth every 6 (six) hours as needed for  congestion. Patient not taking: Reported on 11/25/2017 10/02/15   [name], PA-C  QUEtiapine (SEROQUEL) 100 MG tablet Take 1 tablet (100 mg total) by mouth at bedtime. Patient taking differently: Take 200 mg by mouth at bedtime.  03/19/16   [name], NP  sertraline (ZOLOFT) 50 MG tablet Take 1 tablet (50 mg total) by mouth daily. 03/19/16   [name], NP  tiZANidine (ZANAFLEX) 4 MG tablet Take 1 tablet (4 mg total) by mouth every 6 (six) hours as needed for muscle spasms. Patient not taking: Reported on 11/25/2017 10/28/15   [name], PA-C  traZODone (DESYREL) 50 MG tablet Take 1 tablet (50 mg total) by mouth at bedtime as needed for sleep. Patient not taking: Reported on 11/25/2017 03/19/16   [name], NP    Family History Family History  Problem Relation Age of Onset  . Hyperlipidemia Son     Social History Social History   Tobacco Use  . Smoking status: Current Some Day Smoker    Packs/day: 0.25    Years: 38.00    Pack years: 9.50    Types: Cigarettes    Last attempt to quit: 03/22/2017    Years since quitting: 0.8  . Smokeless tobacco: Never Used  Substance Use Topics  . Alcohol use: Not Currently    Comment: 18 beers and however much alcohol she can get  . Drug use: Not Currently    Types: Cocaine    Comment: states quit alcohol and cocaine 10 years ago      Allergies   Demerol; Latex; Penicillins; Strawberry extract; and Vicodin [hydrocodone-acetaminophen]   Review of Systems Review of Systems  Constitutional: Negative for chills and fever.  HENT: Negative for congestion, rhinorrhea and sore throat.   Eyes: Negative for visual disturbance.  Respiratory: Negative for cough and shortness of breath.   Cardiovascular: Negative for chest pain.  Gastrointestinal: Negative for abdominal pain, diarrhea, nausea and vomiting.  Genitourinary: Positive for vaginal discharge. Negative for decreased urine volume, dysuria, frequency, pelvic pain, vaginal  bleeding and vaginal pain.  Musculoskeletal: Negative for arthralgias and myalgias.  Skin: Negative for color change and rash.  Neurological: Negative for dizziness, syncope and light-headedness.     Physical Exam Updated Vital Signs BP 130/85 (BP Location: Right Arm)   Pulse 68   Temp 98.3 F (36.8 C) (Oral)   Resp 16   Ht 5\' 9"  (1.753 m)   Wt 86.2 kg (190 lb)   LMP 03/19/1985   SpO2 99%   BMI 28.06 kg/m   Physical Exam  Constitutional: She appears well-developed and well-nourished. No distress.  HENT:  Head: Normocephalic and atraumatic.  Mouth/Throat: Oropharynx is clear and moist.  Eyes: Right eye exhibits no discharge. Left eye exhibits no discharge.  Cardiovascular: Normal rate, regular rhythm, normal heart sounds and intact distal pulses.  Pulmonary/Chest: Effort normal and breath sounds normal. No stridor. No respiratory distress. She has no wheezes. She has no rales.  Respirations equal  and unlabored, patient able to speak in full sentences, lungs clear to auscultation bilaterally  Abdominal: Soft. Bowel sounds are normal. She exhibits no distension and no mass. There is no tenderness. There is no guarding.  Abdomen soft, bowel sounds present throughout, nontender to palpation in all quadrants, specifically there is no lower abdominal tenderness, no guarding or peritoneal signs, no CVA tenderness.  Genitourinary:  Genitourinary Comments: Chaperone present during vaginal exam. No external genital lesions noted. Small amount of yellow-green vaginal discharge present in the vaginal vault, and coming from the cervix, no obvious cervicitis.  On bimanual exam there is no cervical motion tenderness, no uterine or adnexal tenderness  Neurological: She is alert. Coordination normal.  Skin: Skin is warm and dry. Capillary refill takes less than 2 seconds. She is not diaphoretic.  Psychiatric: She has a normal mood and affect. Her behavior is normal.  Nursing note and vitals  reviewed.    ED Treatments / Results  Labs (all labs ordered are listed, but only abnormal results are displayed) Labs Reviewed  WET PREP, GENITAL - Abnormal; Notable for the following components:      Result Value   WBC, Wet Prep HPF POC MANY (*)    All other components within normal limits  URINALYSIS, ROUTINE W REFLEX MICROSCOPIC - Abnormal; Notable for the following components:   APPearance HAZY (*)    Hgb urine dipstick SMALL (*)    Leukocytes, UA LARGE (*)    WBC, UA >50 (*)    Bacteria, UA RARE (*)    All other components within normal limits  RPR  HIV ANTIBODY (ROUTINE TESTING)  GC/CHLAMYDIA PROBE AMP () NOT AT [facility]    EKG None  Radiology No results found.  Procedures Procedures (including critical care time)  Medications Ordered in ED Medications  cefTRIAXone (ROCEPHIN) injection 250 mg (has no administration in time range)  azithromycin (ZITHROMAX) tablet 1,000 mg (has no administration in time range)     Initial Impression / Assessment and Plan / ED Course  I have reviewed the triage vital signs and the nursing notes.  Pertinent labs & imaging results that were available during my care of the patient were reviewed by me and considered in my medical decision making (see chart for details).  Pt presents with concerns for possible STD.  Pt understands that they have GC/Chlamydia cultures pending and that they will need to inform all sexual partners if results return positive. Pt has been treated prophylactically with azithromycin and Rocephin due to pts history, pelvic exam, and wet prep with increased WBCs. Pt not concerning for PID because hemodynamically stable and no cervical motion tenderness on pelvic exam.  Wet prep without evidence of BV, yeast or trichomoniasis. Patient to be discharged with instructions to follow up with OBGYN/PCP. Discussed importance of using protection when sexually active.    Final Clinical Impressions(s) / ED Diagnoses    Final diagnoses:  Vaginal discharge    ED Discharge Orders    None       Jacqlyn Larsen, [place] 02/07/18 6440    Drenda Freeze, MD 02/08/18 [phone]

## 2018-02-07 NOTE — Discharge Instructions (Addendum)
You were treated prophylactically today for gonorrhea and chlamydia.  You will be contacted in 2 to 3 days if any of your STD testing is pending.  Please use protection and notify any partners.  You can follow-up at the health department in the future for STD testing and treatment needs.  Please follow-up with your primary care doctor.  Return to the emergency department for fevers, vomiting, abdominal pain or any other new or concerning symptoms.

## 2018-02-07 NOTE — ED Notes (Signed)
Patient refused to sign saying "I'm so fuckin' tired of ya'll I just wanna get the fuck outta here". D/C instructions were read and she verbalized understanding.

## 2018-02-07 NOTE — ED Notes (Signed)
Pt yelling at writer asking "am I going to be seen? I've been here for 15 hours." Pt informed of wait and updated again on where they were in line for waiting for room. Pt still yelling in lobby.

## 2018-02-07 NOTE — ED Notes (Signed)
Pt is aware a urine sample is needed but stated "I can't give a sample right now".

## 2018-02-08 LAB — GC/CHLAMYDIA PROBE AMP (~~LOC~~) NOT AT ARMC
CHLAMYDIA, DNA PROBE: NEGATIVE
Neisseria Gonorrhea: NEGATIVE

## 2018-02-08 LAB — HIV ANTIBODY (ROUTINE TESTING W REFLEX): HIV SCREEN 4TH GENERATION: NONREACTIVE

## 2018-02-08 LAB — RPR: RPR Ser Ql: NONREACTIVE

## 2018-03-22 ENCOUNTER — Encounter (HOSPITAL_COMMUNITY): Payer: Self-pay | Admitting: Emergency Medicine

## 2018-03-22 ENCOUNTER — Emergency Department (HOSPITAL_COMMUNITY)
Admission: EM | Admit: 2018-03-22 | Discharge: 2018-03-22 | Discharge status: Against Medical Advice | Payer: Medicare Other | Attending: Physician Assistant | Admitting: Physician Assistant

## 2018-03-22 ENCOUNTER — Other Ambulatory Visit: Payer: Self-pay

## 2018-03-22 DIAGNOSIS — F1410 Cocaine abuse, uncomplicated: Secondary | ICD-10-CM | POA: Diagnosis not present

## 2018-03-22 DIAGNOSIS — R079 Chest pain, unspecified: Secondary | ICD-10-CM | POA: Diagnosis not present

## 2018-03-22 DIAGNOSIS — Z79899 Other long term (current) drug therapy: Secondary | ICD-10-CM | POA: Diagnosis not present

## 2018-03-22 DIAGNOSIS — Z91040 Latex allergy status: Secondary | ICD-10-CM | POA: Diagnosis not present

## 2018-03-22 DIAGNOSIS — F17210 Nicotine dependence, cigarettes, uncomplicated: Secondary | ICD-10-CM | POA: Insufficient documentation

## 2018-03-22 LAB — URINALYSIS, ROUTINE W REFLEX MICROSCOPIC
BILIRUBIN URINE: NEGATIVE
GLUCOSE, UA: NEGATIVE mg/dL
Hgb urine dipstick: NEGATIVE
KETONES UR: NEGATIVE mg/dL
Nitrite: NEGATIVE
PH: 6 (ref 5.0–8.0)
PROTEIN: NEGATIVE mg/dL
Specific Gravity, Urine: 1.023 (ref 1.005–1.030)

## 2018-03-22 LAB — RAPID URINE DRUG SCREEN, HOSP PERFORMED
Amphetamines: NOT DETECTED
BENZODIAZEPINES: NOT DETECTED
Barbiturates: NOT DETECTED
Cocaine: POSITIVE — AB
Opiates: NOT DETECTED
TETRAHYDROCANNABINOL: NOT DETECTED

## 2018-03-22 LAB — I-STAT TROPONIN, ED: Troponin i, poc: 0 ng/mL (ref 0.00–0.08)

## 2018-03-22 LAB — WET PREP, GENITAL
Clue Cells Wet Prep HPF POC: NONE SEEN
SPERM: NONE SEEN
YEAST WET PREP: NONE SEEN

## 2018-03-22 MED ORDER — AZITHROMYCIN 250 MG PO TABS
1000.0000 mg | ORAL_TABLET | Freq: Once | ORAL | Status: AC
  Administered 2018-03-22: 1000 mg via ORAL
  Filled 2018-03-22: qty 4

## 2018-03-22 MED ORDER — CEFTRIAXONE SODIUM 250 MG IJ SOLR
250.0000 mg | Freq: Once | INTRAMUSCULAR | Status: AC
  Administered 2018-03-22: 250 mg via INTRAMUSCULAR
  Filled 2018-03-22: qty 250

## 2018-03-22 MED ORDER — LIDOCAINE HCL (PF) 1 % IJ SOLN
INTRAMUSCULAR | Status: AC
  Administered 2018-03-22: 16:00:00
  Filled 2018-03-22: qty 5

## 2018-03-22 NOTE — ED Notes (Signed)
Pt refusing to stay connected to vitals and cardiac monitoring. Disconnected at this time. EDP made aware.

## 2018-03-22 NOTE — ED Notes (Signed)
ED techs at bedside drawing blood work at this time.

## 2018-03-22 NOTE — ED Triage Notes (Signed)
Pt BIB EMS for CP while at an adult day program.  Pt states her last drink was last Thursday, the same day as the loss of her son and mother. Pt endorses 3/10 CP describes as central chest tightness, radiating throughout her chest. Received 1 nitro and 324 ASA PTA. A&Ox4; Resp e/u, NAD.    Pt also requesting we treat her "vaginitis," states she has hx of the same.   Pt endorses hx of anxiety and panic attacks, takes seroquel and zoloft. Was seen at crisis center last month for SI. Denies SI at this time, states she is just trying to get through the grief.

## 2018-03-22 NOTE — ED Notes (Signed)
Resident at bedside with pt at this time.

## 2018-03-22 NOTE — ED Provider Notes (Signed)
[facility] EMERGENCY DEPARTMENT Provider Note   CSN: [id number] Arrival date & time: 03/22/18  1445   History   Chief Complaint Chief Complaint  Patient presents with  . Chest Pain    HPI Katrina Kelley is a 62 y.o. female past medical history of cocaine abuse, alcohol abuse, bipolar 1 disorder, schizoaffective disorder here for chest pain  Noticed that she developed sudden midsternal chest tightness today while she was sitting down at her adult day program.  She notes that she thinks that she had a panic attack as she has had pain like this before during a panic attack.  She notes that she has a significant psychiatric history.  Patient states that she developed chest tightness for about 1-1/2 hours, rates it a 9 out of 10.  She admitted to associated shortness of breath when she had chest tightness as well.  Of note patient's son and brother passed away at the same time about 1 week ago and she has been very upset about this.  She has not drink alcohol within the last week.  Denies any drug use.  EMS was called and she received 1 nitro and 1 aspirin.  Notes that her pain improved after this and her pain became 3/10.  Of note patient states that her "vaginitis" is getting worse.  She notes that she has "dripping" vaginal discharge for the last week.  Denies any vaginal bleeding.  She is status post hysterectomy.  HPI  Past Medical History:  Diagnosis Date  . Anxiety   . Arthritis   . Bipolar 1 disorder ([place])   . Depression   . Schizoaffective disorder [facility])     Patient Active Problem List   Diagnosis Date Noted  . Cocaine abuse with cocaine-induced mood disorder ([place]) 01/29/2018  . 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 Alcohol use disorder, moderate, dependence ([place]) 03/17/2016  . Cocaine use disorder, severe, dependence ([place]) 03/17/2016  . Substance induced mood disorder ([place]) 03/17/2016  . Chronic foot pain  02/07/2016  . Osteoarthrosis of ankle and foot 02/07/2016  . Appendicitis 03/12/2014  . Major depression, recurrent, chronic ([place]) 08/20/2012    Past Surgical History:  Procedure Laterality Date  . ABDOMINAL HYSTERECTOMY    . ELEVATION OF DEPRESSED SKULL FRACTURE    . FOOT ARTHRODESIS     rt and lt  . FRACTURE SURGERY  2005   fx both lower legs   . LAPAROSCOPIC APPENDECTOMY N/A 03/12/2014   Procedure: APPENDECTOMY LAPAROSCOPIC;  Surgeon: [name], MD;  Location: [place];  Service: General;  Laterality: N/A;  . TOOTH EXTRACTION  11/27/2011   Procedure: DENTAL RESTORATION/EXTRACTIONS;  Surgeon: [name], DDS;  Location: [facility];  Service: Oral Surgery;  Laterality: N/A;  just extractions     OB History   None      Home Medications    Prior to Admission medications   Medication Sig Start Date End Date Taking? Authorizing Provider  carbamazepine (TEGRETOL) 200 MG tablet Take 200 mg by mouth 2 (two) times daily.    [provider]  meloxicam (MOBIC) 15 MG tablet Take 1 tablet (15 mg total) by mouth daily. Patient not taking: Reported on 11/25/2017 01/28/17   Delia Heady, PA-C  methocarbamol (ROBAXIN) 500 MG tablet Take 1 tablet (500 mg total) by mouth 2 (two) times daily. Patient not taking: Reported on 11/25/2017 01/28/17   Delia Heady, PA-C  naproxen (NAPROSYN) 250 MG tablet Take  1 tablet (250 mg total) by mouth 2 (two) times daily with a meal. Patient not taking: Reported on 11/25/2017 10/20/16   [name], PA-C  oxyCODONE-acetaminophen (PERCOCET) 10-325 MG tablet Take 1 tablet by mouth every 8 (eight) hours as needed for pain. MAXIMUM TOTAL ACETAMINOPHEN DOSE IS 4000 MG PER DAY Patient not taking: Reported on 11/25/2017 10/20/16   [name], DPM  oxyCODONE-acetaminophen (PERCOCET/ROXICET) 5-325 MG tablet Take 2 tablets by mouth every 4 (four) hours as needed for severe pain. Patient not taking: Reported on 11/25/2017 08/31/16   [name], MD  pseudoephedrine (SUDAFED) 60 MG tablet Take 1 tablet (60 mg total) by mouth every 6 (six) hours as needed for congestion. Patient not taking: Reported on 11/25/2017 10/02/15   [name], PA-C  QUEtiapine (SEROQUEL) 100 MG tablet Take 1 tablet (100 mg total) by mouth at bedtime. Patient taking differently: Take 400 mg by mouth at bedtime.  03/19/16   [name], NP  sertraline (ZOLOFT) 50 MG tablet Take 1 tablet (50 mg total) by mouth daily. Patient not taking: Reported on 02/07/2018 03/19/16   [name], NP  tiZANidine (ZANAFLEX) 4 MG tablet Take 1 tablet (4 mg total) by mouth every 6 (six) hours as needed for muscle spasms. Patient not taking: Reported on 11/25/2017 10/28/15   [name], PA-C  traZODone (DESYREL) 50 MG tablet Take 1 tablet (50 mg total) by mouth at bedtime as needed for sleep. Patient not taking: Reported on 11/25/2017 03/19/16   [name], NP    Family History Family History  Problem Relation Age of Onset  . Hyperlipidemia Son     Social History Social History   Tobacco Use  . Smoking status: Current Some Day Smoker    Packs/day: 0.25    Years: 38.00    Pack years: 9.50    Types: Cigarettes    Last attempt to quit: 03/22/2017    Years since quitting: 1.0  . Smokeless tobacco: Never Used  Substance Use Topics  . Alcohol use: Not Currently    Comment: 18 beers and however much alcohol she can get  . Drug use: Not Currently    Types: Cocaine    Comment: states quit alcohol and cocaine 10 years ago      Allergies   Demerol; Latex; Penicillins; Strawberry extract; and Vicodin [hydrocodone-acetaminophen]   Review of Systems Review of Systems: per HPI. Otherwise negative   Physical Exam Updated Vital Signs BP (!) 158/109   Pulse 66   Resp (!) 22   Ht 5\' 8"  (1.727 m)   Wt 84.4 kg (186 lb)   LMP 03/19/1985   SpO2 100%   BMI 28.28 kg/m   Physical Exam  Constitutional: She is oriented to person, place, and time. She appears  well-developed and well-nourished.  HENT:  Head: Normocephalic.  Several missing teeth  Eyes: Pupils are equal, round, and reactive to light.  Neck: Normal range of motion.  Cardiovascular: Normal rate, regular rhythm and normal pulses.  No murmur heard. Pulmonary/Chest: Effort normal and breath sounds normal. No accessory muscle usage. She exhibits no tenderness.  Abdominal: Soft. Bowel sounds are normal.  Genitourinary:  Genitourinary Comments: External genitalia within normal limits.  Vaginal mucosa pink, moist, normal rugae.  Nonfriable cervix without lesions, mucopurulent yellowish-green discharge noted on speculum exam.  No bleeding.  Bimanual exam revealed no adnexal masses bilaterally.    Musculoskeletal: Normal range of motion.  Neurological: She is alert and oriented to person,  place, and time.  Skin: Skin is warm. Capillary refill takes less than 2 seconds. No rash noted.  Psychiatric: She has a normal mood and affect.     ED Treatments / Results  Labs (all labs ordered are listed, but only abnormal results are displayed) Labs Reviewed  WET PREP, GENITAL - Abnormal; Notable for the following components:      Result Value   Trich, Wet Prep PRESENT (*)    WBC, Wet Prep HPF POC MANY (*)    All other components within normal limits  RAPID URINE DRUG SCREEN, HOSP PERFORMED - Abnormal; Notable for the following components:   Cocaine POSITIVE (*)    All other components within normal limits  URINALYSIS, ROUTINE W REFLEX MICROSCOPIC - Abnormal; Notable for the following components:   Leukocytes, UA MODERATE (*)    Bacteria, UA RARE (*)    All other components within normal limits  I-STAT TROPONIN, ED  I-STAT TROPONIN, ED  GC/CHLAMYDIA PROBE AMP ([place]) NOT AT [facility]    EKG EKG Interpretation  Date/Time:  Friday March 22 2018 14:52:19 EDT Ventricular Rate:  74 PR Interval:    QRS Duration: 87 QT Interval:  406 QTC Calculation: 451 R Axis:   14 Text  Interpretation:  Sinus rhythm Probable anteroseptal infarct, old Baseline wander in lead(s) II V5 V6 Normal sinus rhythm Confirmed by Thomasene Lot, [place] ([id number]) on 03/22/2018 3:26:29 PM   Radiology No results found.  Procedures Procedures (including critical care time)  Medications Ordered in ED Medications  azithromycin (ZITHROMAX) tablet 1,000 mg (1,000 mg Oral Given 03/22/18 1612)  cefTRIAXone (ROCEPHIN) injection 250 mg (250 mg Intramuscular Given 03/22/18 1608)  lidocaine (PF) (XYLOCAINE) 1 % injection (  Given 03/22/18 1613)     Initial Impression / Assessment and Plan / ED Course  I have reviewed the triage vital signs and the nursing notes.  Pertinent labs & imaging results that were available during my care of the patient were reviewed by me and considered in my medical decision making (see chart for details).    62 year old with PMH of cocaine abuse, alcohol abuse, multiple psychiatric disorders here for chest tightness earlier today that was resolved with nitro that she received in the EMS.  Initial EKG normal sinus rhythm and no ST changes but there are some flattened T waves in leads 3, V3, V1.  Troponin 0.  UDS performed  Pelvic exam performed, mucopurulent discharge seen at vaginal vault and around external cervix.  Wet prep and GC chlamydia collected.  Prophylactically treated for GC chlamydia based off of exam, ceftriaxone and azithromycin given.   Heart score of 3.  Patient's EKG remained unchanged and her troponins were negative x 1.  Her UDS is positive for cocaine.  Her chest pain is no longer present.  Patient states that she would like to leave and did not want to stay for more troponin blood work and chest pain evaluation. Patient left AMA.  Final Clinical Impressions(s) / ED Diagnoses   Final diagnoses:  Chest pain, unspecified type  Cocaine abuse [facility])    ED Discharge Orders    None       Carlyle Dolly, MD 03/22/18 1756    Macarthur Critchley,  MD 03/22/18 2112

## 2018-03-22 NOTE — ED Notes (Signed)
Pt refusing to stay for repeat troponin; refusing to let this RN collect vitals. Pt leaving AMA at this time. EDP made aware.

## 2018-03-22 NOTE — ED Notes (Signed)
Pt providing urine sample at this time.

## 2018-03-25 LAB — GC/CHLAMYDIA PROBE AMP (~~LOC~~) NOT AT ARMC
Chlamydia: NEGATIVE
Neisseria Gonorrhea: NEGATIVE

## 2018-03-27 ENCOUNTER — Ambulatory Visit (HOSPITAL_COMMUNITY)
Admission: EM | Admit: 2018-03-27 | Discharge: 2018-03-27 | Disposition: A | Discharge status: Home / Self Care | Payer: Medicare Other | Attending: Family Medicine | Admitting: Family Medicine

## 2018-03-27 ENCOUNTER — Encounter (HOSPITAL_COMMUNITY): Payer: Self-pay | Admitting: Emergency Medicine

## 2018-03-27 DIAGNOSIS — F259 Schizoaffective disorder, unspecified: Secondary | ICD-10-CM | POA: Diagnosis not present

## 2018-03-27 DIAGNOSIS — M79604 Pain in right leg: Secondary | ICD-10-CM | POA: Diagnosis not present

## 2018-03-27 DIAGNOSIS — Z791 Long term (current) use of non-steroidal anti-inflammatories (NSAID): Secondary | ICD-10-CM | POA: Insufficient documentation

## 2018-03-27 DIAGNOSIS — G8929 Other chronic pain: Secondary | ICD-10-CM | POA: Diagnosis not present

## 2018-03-27 DIAGNOSIS — F319 Bipolar disorder, unspecified: Secondary | ICD-10-CM | POA: Insufficient documentation

## 2018-03-27 DIAGNOSIS — F14259 Cocaine dependence with cocaine-induced psychotic disorder, unspecified: Secondary | ICD-10-CM | POA: Diagnosis not present

## 2018-03-27 DIAGNOSIS — F419 Anxiety disorder, unspecified: Secondary | ICD-10-CM | POA: Insufficient documentation

## 2018-03-27 DIAGNOSIS — N898 Other specified noninflammatory disorders of vagina: Secondary | ICD-10-CM | POA: Diagnosis present

## 2018-03-27 DIAGNOSIS — F1020 Alcohol dependence, uncomplicated: Secondary | ICD-10-CM | POA: Diagnosis not present

## 2018-03-27 DIAGNOSIS — M79605 Pain in left leg: Secondary | ICD-10-CM

## 2018-03-27 DIAGNOSIS — Z880 Allergy status to penicillin: Secondary | ICD-10-CM | POA: Insufficient documentation

## 2018-03-27 DIAGNOSIS — F17210 Nicotine dependence, cigarettes, uncomplicated: Secondary | ICD-10-CM | POA: Insufficient documentation

## 2018-03-27 DIAGNOSIS — Z885 Allergy status to narcotic agent status: Secondary | ICD-10-CM | POA: Diagnosis not present

## 2018-03-27 DIAGNOSIS — Z79899 Other long term (current) drug therapy: Secondary | ICD-10-CM | POA: Diagnosis not present

## 2018-03-27 DIAGNOSIS — M1990 Unspecified osteoarthritis, unspecified site: Secondary | ICD-10-CM | POA: Insufficient documentation

## 2018-03-27 DIAGNOSIS — R300 Dysuria: Secondary | ICD-10-CM | POA: Diagnosis not present

## 2018-03-27 LAB — POCT URINALYSIS DIP (DEVICE)
BILIRUBIN URINE: NEGATIVE
Glucose, UA: NEGATIVE mg/dL
HGB URINE DIPSTICK: NEGATIVE
Ketones, ur: NEGATIVE mg/dL
NITRITE: NEGATIVE
PH: 5.5 (ref 5.0–8.0)
Protein, ur: NEGATIVE mg/dL
Specific Gravity, Urine: >=1.03 (ref 1.005–1.030)
UROBILINOGEN UA: 0.2 mg/dL (ref 0.0–1.0)

## 2018-03-27 MED ORDER — METRONIDAZOLE 500 MG PO TABS: 500.0000 mg | ORAL_TABLET | Freq: Two times a day (BID) | ORAL | 0 refills | Status: AC

## 2018-03-27 MED ORDER — MELOXICAM 15 MG PO TABS: 15.0000 mg | ORAL_TABLET | Freq: Every day | ORAL | 0 refills | Status: DC

## 2018-03-27 NOTE — Discharge Instructions (Signed)
Begin meloxicam daily for leg pain.  Please begin taking metronidazole twice daily for the next 7 days, please do not drink alcohol while taking this.  This will treat you for both bacterial vaginosis and trichomonas.  We are testing you for Gonorrhea, Chlamydia, Trichomonas, Yeast and Bacterial Vaginosis. We will call you if anything is positive and let you know if you require any further treatment. Please inform partners of any positive results.   Please return if symptoms not improving with treatment, development of fever, nausea, vomiting, abdominal pain.

## 2018-03-27 NOTE — ED Triage Notes (Signed)
Pt here for vaginal discharge and bilateral leg pain
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
## 2018-03-28 LAB — CERVICOVAGINAL ANCILLARY ONLY
Bacterial vaginitis: NEGATIVE
CANDIDA VAGINITIS: NEGATIVE
CHLAMYDIA, DNA PROBE: NEGATIVE
NEISSERIA GONORRHEA: NEGATIVE
Trichomonas: POSITIVE — AB

## 2018-03-29 ENCOUNTER — Telehealth (HOSPITAL_COMMUNITY): Payer: Self-pay

## 2018-03-29 NOTE — Telephone Encounter (Signed)
Trichomonas is positive. Rx metronidazole was given at the urgent care visit. Attempted to contact patient. No answer at this time. Voicemail left. Need to educate to please refrain from sexual intercourse for 7 days to give the medicine time to work. Sexual partners need to be notified and tested/treated. Condoms may reduce risk of reinfection. Recheck for further evaluation if symptoms are not improving.

## 2018-03-30 ENCOUNTER — Telehealth (HOSPITAL_COMMUNITY): Payer: Self-pay

## 2018-03-30 NOTE — Telephone Encounter (Signed)
attempted to reach patient x 2. 

## 2018-04-04 ENCOUNTER — Encounter (HOSPITAL_COMMUNITY): Payer: Self-pay | Admitting: Emergency Medicine

## 2018-04-04 ENCOUNTER — Ambulatory Visit (HOSPITAL_COMMUNITY)
Admission: EM | Admit: 2018-04-04 | Discharge: 2018-04-04 | Disposition: A | Discharge status: Home / Self Care | Payer: Medicare Other | Attending: Family Medicine | Admitting: Family Medicine

## 2018-04-04 ENCOUNTER — Other Ambulatory Visit: Payer: Self-pay

## 2018-04-04 DIAGNOSIS — W19XXXA Unspecified fall, initial encounter: Secondary | ICD-10-CM

## 2018-04-04 DIAGNOSIS — L729 Follicular cyst of the skin and subcutaneous tissue, unspecified: Secondary | ICD-10-CM

## 2018-04-04 DIAGNOSIS — R1909 Other intra-abdominal and pelvic swelling, mass and lump: Secondary | ICD-10-CM
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
## 2018-04-04 NOTE — ED Provider Notes (Signed)
[place]    CSN: [id number] Arrival date & time: 04/04/18  1019     History   Chief Complaint Chief Complaint  Patient presents with  . Fall    HPI Katrina Kelley is a 62 y.o. female.   HPI  Patient is here complaining of a "knot" in her upper abdomen.  She states that she feels a few days ago.  She thinks it is related to a fall she had 2 weeks ago.  She states she fell forward, lost her balance, and hit her abdomen on a hard object.  She had a bruise in his upper abdominal area.  The bruise is gone away.  No nausea vomiting, difficulty eating or swallowing.  No other injuries from the fall, states arms and legs were not injured.  She feels certain the "knot" was not there before her fall.  The knot is not tender.  It is not changing.  Past Medical History:  Diagnosis Date  . Anxiety   . Arthritis   . Bipolar 1 disorder ([place])   . Depression   . Schizoaffective disorder [facility])     Patient Active Problem List   Diagnosis Date Noted  . Cocaine abuse with cocaine-induced mood disorder ([place]) 01/29/2018  . 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 Alcohol use disorder, moderate, dependence ([place]) 03/17/2016  . Cocaine use disorder, severe, dependence ([place]) 03/17/2016  . Substance induced mood disorder ([place]) 03/17/2016  . Chronic foot pain 02/07/2016  . Osteoarthrosis of ankle and foot 02/07/2016  . Appendicitis 03/12/2014  . Major depression, recurrent, chronic ([place]) 08/20/2012    Past Surgical History:  Procedure Laterality Date  . ABDOMINAL HYSTERECTOMY    . ELEVATION OF DEPRESSED SKULL FRACTURE    . FOOT ARTHRODESIS     rt and lt  . FRACTURE SURGERY  2005   fx both lower legs   . LAPAROSCOPIC APPENDECTOMY N/A 03/12/2014   Procedure: APPENDECTOMY LAPAROSCOPIC;  Surgeon: [name], MD;  Location: [place];  Service: General;  Laterality: N/A;  . TOOTH EXTRACTION  11/27/2011   Procedure: DENTAL RESTORATION/EXTRACTIONS;   Surgeon: [name], DDS;  Location: [place];  Service: Oral Surgery;  Laterality: N/A;  just extractions    OB History   None      Home Medications    Prior to Admission medications   Medication Sig Start Date End Date Taking? Authorizing Provider  carbamazepine (TEGRETOL) 200 MG tablet Take 200 mg by mouth 2 (two) times daily.    [provider]  meloxicam (MOBIC) 15 MG tablet Take 1 tablet (15 mg total) by mouth daily. 03/27/18   Wieters, Hallie C, PA-C  QUEtiapine (SEROQUEL) 100 MG tablet Take 1 tablet (100 mg total) by mouth at bedtime. Patient taking differently: Take 400 mg by mouth at bedtime.  03/19/16   [name], NP    Family History Family History  Problem Relation Age of Onset  . Hyperlipidemia Son     Social History Social History   Tobacco Use  . Smoking status: Current Some Day Smoker    Packs/day: 0.25    Years: 38.00    Pack years: 9.50    Types: Cigarettes    Last attempt to quit: 03/22/2017    Years since quitting: 1.0  . Smokeless tobacco: Never Used  Substance Use Topics  . Alcohol use: Not Currently    Comment: 18 beers and however much alcohol she can get  .  Drug use: Not Currently    Types: Cocaine    Comment: states quit alcohol and cocaine 10 years ago      Allergies   Demerol; Latex; Penicillins; Strawberry extract; and Vicodin [hydrocodone-acetaminophen]   Review of Systems Review of Systems  Constitutional: Negative for chills and fever.  HENT: Negative for ear pain and sore throat.   Eyes: Negative for pain and visual disturbance.  Respiratory: Negative for cough and shortness of breath.   Cardiovascular: Negative for chest pain and palpitations.  Gastrointestinal: Negative for abdominal pain and vomiting.  Genitourinary: Negative for dysuria and hematuria.  Musculoskeletal: Negative for arthralgias and back pain.  Skin: Negative for color change and rash.       "Knot"  Neurological: Negative  for seizures and syncope.  Psychiatric/Behavioral: Negative for behavioral problems and dysphoric mood.       Is under treatment, attending self-improvement classes.  All other systems reviewed and are negative.    Physical Exam Triage Vital Signs ED Triage Vitals  Enc Vitals Group     BP 04/04/18 1036 117/82     Pulse Rate 04/04/18 1036 84     Resp 04/04/18 1036 18     Temp 04/04/18 1036 98.4 F (36.9 C)     Temp Source 04/04/18 1036 Oral     SpO2 04/04/18 1036 100 %     Weight --      Height --      Head Circumference --      Peak Flow --      Pain Score 04/04/18 1034 0     Pain Loc --      Pain Edu? --      Excl. in [place]? --    No data found.  Updated Vital Signs BP 117/82 (BP Location: Right Arm)   Pulse 84   Temp 98.4 F (36.9 C) (Oral)   Resp 18   LMP 03/19/1985   SpO2 100%   Visual Acuity Right Eye Distance:   Left Eye Distance:   Bilateral Distance:    Right Eye Near:   Left Eye Near:    Bilateral Near:     Physical Exam  Constitutional: She appears well-developed and well-nourished. No distress.  Pleasant, cooperative  HENT:  Head: Normocephalic and atraumatic.  Mouth/Throat: Oropharynx is clear and moist.  Eyes: Pupils are equal, round, and reactive to light. Conjunctivae are normal.  Neck: Normal range of motion.  Cardiovascular: Normal rate, regular rhythm and normal heart sounds.  Pulmonary/Chest: Effort normal and breath sounds normal. No respiratory distress. She has no wheezes.  Abdominal: Soft. Bowel sounds are normal. She exhibits no distension. There is no tenderness.    Musculoskeletal: Normal range of motion. She exhibits no edema.  Neurological: She is alert.  Skin: Skin is warm and dry.     UC Treatments / Results  Labs (all labs ordered are listed, but only abnormal results are displayed) Labs Reviewed - No data to display  EKG None  Radiology No results found.  Procedures Procedures (including critical care  time)  Medications Ordered in UC Medications - No data to display  Initial Impression / Assessment and Plan / UC Course  I have reviewed the triage vital signs and the nursing notes.  Pertinent labs & imaging results that were available during my care of the patient were reviewed by me and considered in my medical decision making (see chart for details).     Discussed with patient that this is a  benign skin cyst or possibly a lipoma.  It may be related to the fall, and may have been there previously and just not noticed.  No additional testing is necessary. Final Clinical Impressions(s) / UC Diagnoses   Final diagnoses:  Fall, initial encounter  Benign skin cyst     Discharge Instructions     Return for problems   ED Prescriptions    None     Controlled Substance Prescriptions [place] Controlled Substance Registry consulted? Not Applicable   Raylene Everts, MD 04/04/18 1105

## 2018-04-04 NOTE — Discharge Instructions (Signed)
Return for problems

## 2018-04-21 ENCOUNTER — Emergency Department (HOSPITAL_COMMUNITY)
Admission: EM | Admit: 2018-04-21 | Discharge: 2018-04-21 | Disposition: A | Discharge status: Home / Self Care | Payer: Medicare Other | Attending: Emergency Medicine | Admitting: Emergency Medicine

## 2018-04-21 ENCOUNTER — Encounter (HOSPITAL_COMMUNITY): Payer: Self-pay | Admitting: Emergency Medicine

## 2018-04-21 DIAGNOSIS — N3000 Acute cystitis without hematuria: Secondary | ICD-10-CM | POA: Insufficient documentation

## 2018-04-21 DIAGNOSIS — R45851 Suicidal ideations: Secondary | ICD-10-CM | POA: Diagnosis not present

## 2018-04-21 DIAGNOSIS — F17210 Nicotine dependence, cigarettes, uncomplicated: Secondary | ICD-10-CM | POA: Diagnosis not present

## 2018-04-21 DIAGNOSIS — Z91040 Latex allergy status: Secondary | ICD-10-CM | POA: Insufficient documentation

## 2018-04-21 DIAGNOSIS — F329 Major depressive disorder, single episode, unspecified: Secondary | ICD-10-CM | POA: Diagnosis present

## 2018-04-21 DIAGNOSIS — F259 Schizoaffective disorder, unspecified: Secondary | ICD-10-CM | POA: Diagnosis not present

## 2018-04-21 DIAGNOSIS — Z79899 Other long term (current) drug therapy: Secondary | ICD-10-CM | POA: Diagnosis not present

## 2018-04-21 LAB — URINALYSIS, ROUTINE W REFLEX MICROSCOPIC
BILIRUBIN URINE: NEGATIVE
Glucose, UA: NEGATIVE mg/dL
HGB URINE DIPSTICK: NEGATIVE
KETONES UR: NEGATIVE mg/dL
NITRITE: POSITIVE — AB
PROTEIN: NEGATIVE mg/dL
Specific Gravity, Urine: 1.02 (ref 1.005–1.030)
pH: 5 (ref 5.0–8.0)

## 2018-04-21 LAB — COMPREHENSIVE METABOLIC PANEL
ALBUMIN: 3.4 g/dL — AB (ref 3.5–5.0)
ALT: 67 U/L — ABNORMAL HIGH (ref 0–44)
ANION GAP: 5 (ref 5–15)
AST: 77 U/L — ABNORMAL HIGH (ref 15–41)
Alkaline Phosphatase: 59 U/L (ref 38–126)
BILIRUBIN TOTAL: 1.1 mg/dL (ref 0.3–1.2)
BUN: 11 mg/dL (ref 8–23)
CO2: 25 mmol/L (ref 22–32)
Calcium: 8.9 mg/dL (ref 8.9–10.3)
Chloride: 108 mmol/L (ref 98–111)
Creatinine, Ser: 0.78 mg/dL (ref 0.44–1.00)
GFR calc Af Amer: >60 mL/min (ref >60)
GFR calc non Af Amer: >60 mL/min (ref >60)
GLUCOSE: 99 mg/dL (ref 70–99)
POTASSIUM: 3.8 mmol/L (ref 3.5–5.1)
SODIUM: 138 mmol/L (ref 135–145)
TOTAL PROTEIN: 8.1 g/dL (ref 6.5–8.1)

## 2018-04-21 LAB — CBC
HEMATOCRIT: 45 % (ref 36.0–46.0)
Hemoglobin: 14.2 g/dL (ref 12.0–15.0)
MCH: 32.1 pg (ref 26.0–34.0)
MCHC: 31.6 g/dL (ref 30.0–36.0)
MCV: 101.8 fL — ABNORMAL HIGH (ref 78.0–100.0)
PLATELETS: 137 10*3/uL — AB (ref 150–400)
RBC: 4.42 MIL/uL (ref 3.87–5.11)
RDW: 14.3 % (ref 11.5–15.5)
WBC: 3.7 10*3/uL — AB (ref 4.0–10.5)

## 2018-04-21 LAB — RAPID URINE DRUG SCREEN, HOSP PERFORMED
Amphetamines: NOT DETECTED
Benzodiazepines: NOT DETECTED
COCAINE: POSITIVE — AB
OPIATES: NOT DETECTED
Tetrahydrocannabinol: NOT DETECTED

## 2018-04-21 LAB — ACETAMINOPHEN LEVEL

## 2018-04-21 LAB — SALICYLATE LEVEL

## 2018-04-21 LAB — ETHANOL: Alcohol, Ethyl (B): <10 mg/dL (ref <10)

## 2018-04-21 MED ORDER — NITROFURANTOIN MONOHYD MACRO 100 MG PO CAPS
100.0000 mg | ORAL_CAPSULE | Freq: Once | ORAL | Status: AC
  Administered 2018-04-21: 100 mg via ORAL
  Filled 2018-04-21: qty 1

## 2018-04-21 MED ORDER — NITROFURANTOIN MONOHYD MACRO 100 MG PO CAPS: 100.0000 mg | ORAL_CAPSULE | Freq: Two times a day (BID) | ORAL | 0 refills | Status: DC

## 2018-04-21 MED ORDER — ONDANSETRON HCL 4 MG PO TABS: 4.0000 mg | ORAL_TABLET | Freq: Three times a day (TID) | ORAL | Status: DC | PRN

## 2018-04-21 MED ORDER — ALUM & MAG HYDROXIDE-SIMETH 200-200-20 MG/5ML PO SUSP: 30.0000 mL | Freq: Four times a day (QID) | ORAL | Status: DC | PRN

## 2018-04-21 MED ORDER — IBUPROFEN 400 MG PO TABS: 600.0000 mg | ORAL_TABLET | Freq: Three times a day (TID) | ORAL | Status: DC | PRN

## 2018-04-21 NOTE — ED Notes (Signed)
Pt given burgundy scrubs bag to place clothes in.
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
## 2018-04-21 NOTE — ED Notes (Signed)
Transported pt to F1 for TTS
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
## 2018-04-21 NOTE — ED Notes (Signed)
PT WANDED BY SECURITY
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
## 2018-04-21 NOTE — Discharge Instructions (Signed)
Please read and follow all provided instructions.  Your diagnoses today include:  1. Schizoaffective disorder, unspecified type ([place])    Tests performed today include:  Blood counts and electrolytes  Urine test -shows urinary tract infection  Vital signs. See below for your results today.   Medications prescribed:   Macrobid - antibiotic for urinary tract infection  You have been prescribed an antibiotic medicine: take the entire course of medicine even if you are feeling better. Stopping early can cause the antibiotic not to work.  Take any prescribed medications only as directed.  Home care instructions:  Follow any educational materials contained in this packet.  Follow-up instructions: Please follow-up with your doctor in the next 3 days for further evaluation of your symptoms.   Return instructions:   Please return to the Emergency Department if you experience worsening symptoms.   Please return if you have any other emergent concerns.  Additional Information:  Your vital signs today were: BP (!) 173/122    Pulse 77    Temp 98.5 F (36.9 C) (Oral)    Resp 20    LMP 03/19/1985    SpO2 98%  If your blood pressure (BP) was elevated above 135/85 this visit, please have this repeated by your doctor within one month. --------------

## 2018-04-21 NOTE — ED Notes (Signed)
PT refused to sign for discharge instructions and refused to have vitals taken before discharge.  PT was given discharge instructions and verbalized understanding.   This pt is AO to baseline.  NAD noted at time of discharged.

## 2018-04-21 NOTE — ED Triage Notes (Addendum)
Pt states hx of schizophrenia, bipolar, hasnt had her meds since yesterday, states she is "running from someone" and just needs to get somewhere safe. Pt also states she is hearing voices and is feeling suicidal. Pt states "I am just tired of being scared." when asked if she had a plan, patient only would state, "I just need to talk to my doctor." denies HI. Pt also reports painful urination for the past few days

## 2018-04-21 NOTE — ED Notes (Signed)
Lunch tray ordered; regular diet 

## 2018-04-23 LAB — URINE CULTURE

## 2018-04-24 ENCOUNTER — Telehealth: Payer: Self-pay | Admitting: Emergency Medicine

## 2018-04-24 NOTE — Telephone Encounter (Signed)
Post ED Visit - Positive Culture Follow-up  Culture report reviewed by antimicrobial stewardship pharmacist:  []  Elenor Quinones, Pharm.D. []  Heide Guile, Pharm.D., BCPS AQ-ID []  Parks Neptune, Pharm.D., BCPS []  Alycia Rossetti, Pharm.D., BCPS []  Cable, Pharm.D., BCPS, AAHIVP []  Legrand Como, Pharm.D., BCPS, AAHIVP [x]  Salome Arnt, PharmD, BCPS []  Johnnette Gourd, PharmD, BCPS []  Hughes Better, PharmD, BCPS []  Leeroy Cha, PharmD  Positive urine culture Treated with nitrofurantoin, organism sensitive to the same and no further patient follow-up is required at this time.  Hazle Nordmann 04/24/2018, 10:44 AM

## 2018-05-01 ENCOUNTER — Encounter (HOSPITAL_COMMUNITY): Payer: Self-pay | Admitting: Emergency Medicine

## 2018-05-01 ENCOUNTER — Ambulatory Visit (HOSPITAL_COMMUNITY)
Admission: EM | Admit: 2018-05-01 | Discharge: 2018-05-01 | Disposition: A | Discharge status: Home / Self Care | Payer: Medicare Other | Attending: Internal Medicine | Admitting: Internal Medicine

## 2018-05-01 DIAGNOSIS — N390 Urinary tract infection, site not specified: Secondary | ICD-10-CM

## 2018-05-01 DIAGNOSIS — R197 Diarrhea, unspecified: Secondary | ICD-10-CM

## 2018-05-01 DIAGNOSIS — R300 Dysuria: Secondary | ICD-10-CM | POA: Diagnosis not present

## 2018-05-01 DIAGNOSIS — R350 Frequency of micturition: Secondary | ICD-10-CM

## 2018-05-01 LAB — POCT URINALYSIS DIP (DEVICE)
GLUCOSE, UA: NEGATIVE mg/dL
Hgb urine dipstick: NEGATIVE
Ketones, ur: NEGATIVE mg/dL
NITRITE: NEGATIVE
PROTEIN: 30 mg/dL — AB
Urobilinogen, UA: 1 mg/dL (ref 0.0–1.0)
pH: 5.5 (ref 5.0–8.0)

## 2018-05-01 MED ORDER — ONDANSETRON HCL 4 MG PO TABS: 4.0000 mg | ORAL_TABLET | Freq: Three times a day (TID) | ORAL | 0 refills | Status: DC | PRN

## 2018-05-01 MED ORDER — NITROFURANTOIN MONOHYD MACRO 100 MG PO CAPS: 100.0000 mg | ORAL_CAPSULE | Freq: Two times a day (BID) | ORAL | 0 refills | Status: DC

## 2018-05-01 NOTE — ED Triage Notes (Signed)
Pt sts UTI sx with dysuria; pt sts not feeling well also

## 2018-05-01 NOTE — Discharge Instructions (Signed)
Small frequent sips of fluids- Pedialyte, Gatorade, water, broth- to maintain hydration.   Zofran as needed for nausea. Complete course of antibiotics.   If symptoms worsen or do not improve in the next week to return to be seen or to follow up with your PCP. If develop worsening of pain, weakness, dehydration, blood in diarrhea, or no improvement in the next 3 days please return or go to Er.

## 2018-05-01 NOTE — ED Provider Notes (Signed)
[place]    CSN: [id number] Arrival date & time: 05/01/18  1333     History   Chief Complaint Chief Complaint  Patient presents with  . Urinary Tract Infection    HPI Katrina Kelley is a 62 y.o. female.   [place] presents with complaints of pressure with urination, frequency and  Incomplete urination. Worse today. Was diagnosed with UTI 7/7 but was unable to pick up medications therefore did not take course of antibiotics. No blood in urine. No fevers or back pain. States today she also developed nausea and diarrhea. Stool is green in color. No blood. Feels weak. Has been drinking some sips of fluids. Had noodles this morning. No known exposures. No vomiting. No abdominal pain. Hx of anxiety, arthritis, bipolar, depression, schizoaffective.    ROS per HPI.      Past Medical History:  Diagnosis Date  . Anxiety   . Arthritis   . Bipolar 1 disorder ([place])   . Depression   . Schizoaffective disorder [facility])     Patient Active Problem List   Diagnosis Date Noted  . Cocaine abuse with cocaine-induced mood disorder ([place]) 01/29/2018  . 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 Alcohol use disorder, moderate, dependence ([place]) 03/17/2016  . Cocaine use disorder, severe, dependence ([place]) 03/17/2016  . Substance induced mood disorder ([place]) 03/17/2016  . Chronic foot pain 02/07/2016  . Osteoarthrosis of ankle and foot 02/07/2016  . Appendicitis 03/12/2014  . Major depression, recurrent, chronic ([place]) 08/20/2012    Past Surgical History:  Procedure Laterality Date  . ABDOMINAL HYSTERECTOMY    . ELEVATION OF DEPRESSED SKULL FRACTURE    . FOOT ARTHRODESIS     rt and lt  . FRACTURE SURGERY  2005   fx both lower legs   . LAPAROSCOPIC APPENDECTOMY N/A 03/12/2014   Procedure: APPENDECTOMY LAPAROSCOPIC;  Surgeon: [name], MD;  Location: [place];  Service: General;  Laterality: N/A;  . TOOTH EXTRACTION  11/27/2011   Procedure:  DENTAL RESTORATION/EXTRACTIONS;  Surgeon: [name], DDS;  Location: [place];  Service: Oral Surgery;  Laterality: N/A;  just extractions    OB History   None      Home Medications    Prior to Admission medications   Medication Sig Start Date End Date Taking? Authorizing Provider  carbamazepine (TEGRETOL) 200 MG tablet Take 200 mg by mouth 2 (two) times daily.    [provider]  meloxicam (MOBIC) 15 MG tablet Take 1 tablet (15 mg total) by mouth daily. 03/27/18   Wieters, Hallie C, PA-C  nitrofurantoin, macrocrystal-monohydrate, (MACROBID) 100 MG capsule Take 1 capsule (100 mg total) by mouth 2 (two) times daily. 05/01/18   Zigmund Gottron, NP  nitroGLYCERIN (NITROSTAT) 0.4 MG SL tablet Place 0.4 mg under the tongue as needed. 03/22/18   [provider]  ondansetron (ZOFRAN) 4 MG tablet Take 1 tablet (4 mg total) by mouth every 8 (eight) hours as needed for nausea or vomiting. 05/01/18   Zigmund Gottron, NP  QUEtiapine (SEROQUEL) 100 MG tablet Take 1 tablet (100 mg total) by mouth at bedtime. Patient taking differently: Take 200 mg by mouth at bedtime.  03/19/16   [name], NP  sertraline (ZOLOFT) 50 MG tablet Take 50 mg by mouth daily.    [provider]    Family History Family History  Problem Relation Age of Onset  . Hyperlipidemia Son     Social History  Social History   Tobacco Use  . Smoking status: Current Some Day Smoker    Packs/day: 0.25    Years: 38.00    Pack years: 9.50    Types: Cigarettes    Last attempt to quit: 03/22/2017    Years since quitting: 1.1  . Smokeless tobacco: Never Used  Substance Use Topics  . Alcohol use: Not Currently    Comment: 18 beers and however much alcohol she can get  . Drug use: Not Currently    Types: Cocaine    Comment: states quit alcohol and cocaine 10 years ago      Allergies   Demerol; Latex; Penicillins; Strawberry extract; and Vicodin  [hydrocodone-acetaminophen]   Review of Systems Review of Systems   Physical Exam Triage Vital Signs ED Triage Vitals  Enc Vitals Group     BP 05/01/18 1341 123/75     Pulse Rate 05/01/18 1341 89     Resp 05/01/18 1341 18     Temp 05/01/18 1341 98.1 F (36.7 C)     Temp Source 05/01/18 1341 Oral     SpO2 05/01/18 1341 98 %     Weight --      Height --      Head Circumference --      Peak Flow --      Pain Score 05/01/18 1342 6     Pain Loc --      Pain Edu? --      Excl. in [place]? --    No data found.  Updated Vital Signs BP 123/75 (BP Location: Right Arm)   Pulse 89   Temp 98.1 F (36.7 C) (Oral)   Resp 18   LMP 03/19/1985   SpO2 98%   Visual Acuity Right Eye Distance:   Left Eye Distance:   Bilateral Distance:    Right Eye Near:   Left Eye Near:    Bilateral Near:     Physical Exam  Constitutional: She is oriented to person, place, and time. She appears well-developed and well-nourished. No distress.  Cardiovascular: Normal rate, regular rhythm and normal heart sounds.  Pulmonary/Chest: Effort normal and breath sounds normal.  Abdominal: Soft. Bowel sounds are normal. There is no tenderness. There is no rigidity, no rebound, no guarding, no CVA tenderness, no tenderness at McBurney's point and negative Murphy's sign.  Neurological: She is alert and oriented to person, place, and time.  Skin: Skin is warm and dry.     UC Treatments / Results  Labs (all labs ordered are listed, but only abnormal results are displayed) Labs Reviewed  POCT URINALYSIS DIP (DEVICE) - Abnormal; Notable for the following components:      Result Value   Bilirubin Urine SMALL (*)    Protein, ur 30 (*)    Leukocytes, UA TRACE (*)    All other components within normal limits    EKG None  Radiology No results found.  Procedures Procedures (including critical care time)  Medications Ordered in UC Medications - No data to display  Initial Impression / Assessment and  Plan / UC Course  I have reviewed the triage vital signs and the nursing notes.  Pertinent labs & imaging results that were available during my care of the patient were reviewed by me and considered in my medical decision making (see chart for details).     Non toxic in appearance. Afebrile. No acute abdominal findings on exam. ua still with trace leuks , culture positive for ecoli 7/7, did  not take macrobid. Refilled today to restart. zofran as needed. Patient taking PO fluids in clinic and tolerated. Ambulatory without difficulty, vitals stable. Supportive cares recommended. Push fluids. zofran prn. Patient verbalized understanding and agreeable to plan.   Final Clinical Impressions(s) / UC Diagnoses   Final diagnoses:  Lower urinary tract infectious disease  Diarrhea, unspecified type     Discharge Instructions     Small frequent sips of fluids- Pedialyte, Gatorade, water, broth- to maintain hydration.   Zofran as needed for nausea. Complete course of antibiotics.   If symptoms worsen or do not improve in the next week to return to be seen or to follow up with your PCP. If develop worsening of pain, weakness, dehydration, blood in diarrhea, or no improvement in the next 3 days please return or go to Er.      ED Prescriptions    Medication Sig Dispense Auth. Provider   nitrofurantoin, macrocrystal-monohydrate, (MACROBID) 100 MG capsule Take 1 capsule (100 mg total) by mouth 2 (two) times daily. 10 capsule Augusto Gamble B, NP   ondansetron (ZOFRAN) 4 MG tablet Take 1 tablet (4 mg total) by mouth every 8 (eight) hours as needed for nausea or vomiting. 10 tablet Zigmund Gottron, NP     Controlled Substance Prescriptions [place] Controlled Substance Registry consulted? Not Applicable   Zigmund Gottron, NP 05/01/18 1416

## 2018-05-09 ENCOUNTER — Other Ambulatory Visit: Payer: Self-pay

## 2018-05-09 ENCOUNTER — Inpatient Hospital Stay (HOSPITAL_COMMUNITY)
Admission: RE | Admit: 2018-05-09 | Discharge: 2018-05-15 | DRG: 885 | Disposition: A | Discharge status: Home / Self Care | Payer: Medicare Other | Attending: Psychiatry | Admitting: Psychiatry

## 2018-05-09 ENCOUNTER — Encounter (HOSPITAL_COMMUNITY): Payer: Self-pay | Admitting: *Deleted

## 2018-05-09 DIAGNOSIS — Z91018 Allergy to other foods: Secondary | ICD-10-CM | POA: Diagnosis not present

## 2018-05-09 DIAGNOSIS — F4310 Post-traumatic stress disorder, unspecified: Secondary | ICD-10-CM | POA: Diagnosis present

## 2018-05-09 DIAGNOSIS — R45851 Suicidal ideations: Secondary | ICD-10-CM | POA: Diagnosis present

## 2018-05-09 DIAGNOSIS — Z91040 Latex allergy status: Secondary | ICD-10-CM

## 2018-05-09 DIAGNOSIS — Z90710 Acquired absence of both cervix and uterus: Secondary | ICD-10-CM | POA: Diagnosis not present

## 2018-05-09 DIAGNOSIS — Z23 Encounter for immunization: Secondary | ICD-10-CM

## 2018-05-09 DIAGNOSIS — M1990 Unspecified osteoarthritis, unspecified site: Secondary | ICD-10-CM | POA: Diagnosis present

## 2018-05-09 DIAGNOSIS — F1024 Alcohol dependence with alcohol-induced mood disorder: Secondary | ICD-10-CM | POA: Diagnosis not present

## 2018-05-09 DIAGNOSIS — F410 Panic disorder [episodic paroxysmal anxiety] without agoraphobia: Secondary | ICD-10-CM | POA: Diagnosis present

## 2018-05-09 DIAGNOSIS — Z915 Personal history of self-harm: Secondary | ICD-10-CM | POA: Diagnosis not present

## 2018-05-09 DIAGNOSIS — Z818 Family history of other mental and behavioral disorders: Secondary | ICD-10-CM | POA: Diagnosis not present

## 2018-05-09 DIAGNOSIS — Z820 Family history of epilepsy and other diseases of the nervous system: Secondary | ICD-10-CM

## 2018-05-09 DIAGNOSIS — Z885 Allergy status to narcotic agent status: Secondary | ICD-10-CM

## 2018-05-09 DIAGNOSIS — F1020 Alcohol dependence, uncomplicated: Secondary | ICD-10-CM | POA: Diagnosis present

## 2018-05-09 DIAGNOSIS — Z880 Allergy status to penicillin: Secondary | ICD-10-CM

## 2018-05-09 DIAGNOSIS — Z91410 Personal history of adult physical and sexual abuse: Secondary | ICD-10-CM | POA: Diagnosis not present

## 2018-05-09 DIAGNOSIS — F259 Schizoaffective disorder, unspecified: Secondary | ICD-10-CM | POA: Diagnosis present

## 2018-05-09 DIAGNOSIS — F332 Major depressive disorder, recurrent severe without psychotic features: Secondary | ICD-10-CM | POA: Diagnosis present

## 2018-05-09 DIAGNOSIS — F1490 Cocaine use, unspecified, uncomplicated: Secondary | ICD-10-CM | POA: Diagnosis not present

## 2018-05-09 DIAGNOSIS — F4000 Agoraphobia, unspecified: Secondary | ICD-10-CM | POA: Diagnosis present

## 2018-05-09 DIAGNOSIS — Z811 Family history of alcohol abuse and dependence: Secondary | ICD-10-CM | POA: Diagnosis not present

## 2018-05-09 DIAGNOSIS — F17210 Nicotine dependence, cigarettes, uncomplicated: Secondary | ICD-10-CM | POA: Diagnosis present

## 2018-05-09 DIAGNOSIS — Z590 Homelessness: Secondary | ICD-10-CM | POA: Diagnosis not present

## 2018-05-09 DIAGNOSIS — G4700 Insomnia, unspecified: Secondary | ICD-10-CM | POA: Diagnosis present

## 2018-05-09 DIAGNOSIS — Z634 Disappearance and death of family member: Secondary | ICD-10-CM | POA: Diagnosis not present

## 2018-05-09 MED ORDER — QUETIAPINE FUMARATE 200 MG PO TABS
200.0000 mg | ORAL_TABLET | Freq: Every day | ORAL | Status: DC
  Administered 2018-05-09: 200 mg via ORAL
  Filled 2018-05-09 (×2): qty 1

## 2018-05-09 MED ORDER — NITROGLYCERIN 0.4 MG SL SUBL: 0.4000 mg | SUBLINGUAL_TABLET | SUBLINGUAL | Status: DC | PRN

## 2018-05-09 MED ORDER — METRONIDAZOLE 500 MG PO TABS
500.00 | ORAL_TABLET | ORAL | Status: DC
Start: 2018-05-09 — End: 2018-05-09

## 2018-05-09 MED ORDER — IBUPROFEN 600 MG PO TABS
600.0000 mg | ORAL_TABLET | Freq: Four times a day (QID) | ORAL | Status: DC | PRN
  Administered 2018-05-09 – 2018-05-15 (×4): 600 mg via ORAL
  Filled 2018-05-09 (×4): qty 1

## 2018-05-09 MED ORDER — PNEUMOCOCCAL VAC POLYVALENT 25 MCG/0.5ML IJ INJ
0.5000 mL | INJECTION | INTRAMUSCULAR | Status: AC
  Administered 2018-05-10: 0.5 mL via INTRAMUSCULAR

## 2018-05-09 MED ORDER — CARBAMAZEPINE 200 MG PO TABS
200.0000 mg | ORAL_TABLET | Freq: Two times a day (BID) | ORAL | Status: DC
  Administered 2018-05-09 – 2018-05-15 (×12): 200 mg via ORAL
  Filled 2018-05-09 (×15): qty 1

## 2018-05-09 MED ORDER — SERTRALINE HCL 50 MG PO TABS
50.0000 mg | ORAL_TABLET | Freq: Every day | ORAL | Status: DC
  Administered 2018-05-09 – 2018-05-12 (×4): 50 mg via ORAL
  Filled 2018-05-09 (×7): qty 1

## 2018-05-09 MED ORDER — NICOTINE 21 MG/24HR TD PT24
21.0000 mg | MEDICATED_PATCH | Freq: Every day | TRANSDERMAL | Status: DC
  Administered 2018-05-09 – 2018-05-12 (×4): 21 mg via TRANSDERMAL
  Filled 2018-05-09 (×9): qty 1
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
## 2018-05-09 NOTE — Progress Notes (Signed)
Adult Psychoeducational Group Note  Date:  05/09/2018 Time:  9:09 PM  Group Topic/Focus:  Wrap-Up Group:   The focus of this group is to help patients review their daily goal of treatment and discuss progress on daily workbooks.  Participation Level:  Active  Participation Quality:  Appropriate  Affect:  Appropriate  Cognitive:  Alert  Insight: Appropriate  Engagement in Group:  Engaged  Modes of Intervention:  Discussion  Additional Comments:  Patient stated that her day has been up and down. Patient stated that she is still suicidal and is in and out of psychosis.  Nurse was notified. Patient's goal for today was to get back on her medication.  Katrina Kelley L Phillip Maffei 05/09/2018, 9:09 PM
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
## 2018-05-09 NOTE — H&P (Signed)
Behavioral Health Medical Screening Exam  Katrina Kelley is an 62 y.o. female patient presents as a walk in at [facility] with complaints of suicidal ideation.  Patient states she attempted suicide last night.  "I was getting ready to take a handful of my Seroquel when my boyfriend stopped me.  He took me to [facility] but they let me go.  I don't feel safe.  I'm not in a safe place."  Patient denies homicidal ideation, psychosis, and paranoia.  Continues to endorse suicidal ideation and unable to contract for safety.   Total Time spent with patient: 30 minutes  Psychiatric Specialty Exam: Physical Exam  Vitals reviewed. Constitutional: She is oriented to person, place, and time. She appears well-nourished.  Neck: Normal range of motion. Neck supple.  Respiratory: Effort normal.  Musculoskeletal: Normal range of motion.  Neurological: She is alert and oriented to person, place, and time.  Skin: Skin is warm and dry.  Psychiatric: Her speech is normal and behavior is normal. Her mood appears anxious. Cognition and memory are normal. She expresses impulsivity. She exhibits a depressed mood. She expresses suicidal ideation. She expresses suicidal plans.    Review of Systems  Psychiatric/Behavioral: Positive for depression and suicidal ideas.  All other systems reviewed and are negative.   Blood pressure (!) 127/104, pulse 76, temperature 98.7 F (37.1 C), resp. rate 18, last menstrual period 03/19/1985, SpO2 92 %.There is no height or weight on file to calculate BMI.  General Appearance: Casual  Eye Contact:  Good  Speech:  Clear and Coherent and Normal Rate  Volume:  Normal  Mood:  Anxious and Depressed  Affect:  Depressed and Flat  Thought Process:  Coherent  Orientation:  Full (Time, Place, and Person)  Thought Content:  Logical  Suicidal Thoughts:  Yes.  with intent/plan  Homicidal Thoughts:  No  Memory:  Immediate;   Good Recent;   Good Remote;   Good  Judgement:   Impaired  Insight:  Lacking  Psychomotor Activity:  Normal  Concentration: Concentration: Fair and Attention Span: Fair  Recall:  Good  Fund of Knowledge:Fair  Language: Good  Akathisia:  No  Handed:  Right  AIMS (if indicated):     Assets:  Communication Skills Desire for Improvement  Sleep:       Musculoskeletal: Strength & Muscle Tone: within normal limits Gait & Station: normal Patient leans: N/A  Blood pressure (!) 127/104, pulse 76, temperature 98.7 F (37.1 C), resp. rate 18, last menstrual period 03/19/1985, SpO2 92 %.  Recommendations:  Inpatient psychiatric treatment  Based on my evaluation the patient does not appear to have an emergency medical condition.  Makinsley Schiavi, NP 05/09/2018, 1:29 PM

## 2018-05-09 NOTE — Tx Team (Signed)
Initial Treatment Plan 05/09/2018 2:43 PM Katrina Kelley PQZ:[id number]    PATIENT STRESSORS: Loss of mother Marital or family conflict Medication change or noncompliance Substance abuse   PATIENT STRENGTHS: Ability for insight Average or above average intelligence Capable of independent living Communication skills General fund of knowledge   PATIENT IDENTIFIED PROBLEMS: Depression Substance abuse Suicidal thoughts "I wasn't taking my medicine like I should"                     DISCHARGE CRITERIA:  Ability to meet basic life and health needs Improved stabilization in mood, thinking, and/or behavior Verbal commitment to aftercare and medication compliance Withdrawal symptoms are absent or subacute and managed without 24-hour nursing intervention  PRELIMINARY DISCHARGE PLAN: Attend aftercare/continuing care group  PATIENT/FAMILY INVOLVEMENT: This treatment plan has been presented to and reviewed with the patient, Katrina Kelley, and/or family member, .  The patient and family have been given the opportunity to ask questions and make suggestions.  Joda Braatz, [place], [place] 05/09/2018, 2:43 PM

## 2018-05-09 NOTE — Plan of Care (Signed)
  Problem: Education: Goal: Knowledge of [place] [facility] Education information/materials will improve Outcome: Progressing   

## 2018-05-09 NOTE — Progress Notes (Addendum)
Nursing Progress Note: 7p-7a D: Pt currently presents with a depressed/anxious affect and behavior. Pt states "I am having racing thoughts, and I am just so sad and depressed. I am still having suicidal thoughts." Interacting appropriately with the milieu. Pt reports good sleep during the previous night with current medication regimen. Pt did attend wrap-up group.  A: Pt provided with medications per providers orders. Pt's labs and vitals were monitored throughout the night. Pt supported emotionally and encouraged to express concerns and questions. Pt educated on medications.  R: Pt's safety ensured with 15 minute and environmental checks. Pt currently denies HI and AVH and endorses passive SI. Pt verbally contracts to seek staff if SI, HI, or AVH occurs and to consult with staff before acting on any harmful thoughts. Will continue to monitor.

## 2018-05-10 DIAGNOSIS — F1024 Alcohol dependence with alcohol-induced mood disorder: Secondary | ICD-10-CM

## 2018-05-10 DIAGNOSIS — R45851 Suicidal ideations: Secondary | ICD-10-CM

## 2018-05-10 DIAGNOSIS — Z634 Disappearance and death of family member: Secondary | ICD-10-CM

## 2018-05-10 DIAGNOSIS — Z811 Family history of alcohol abuse and dependence: Secondary | ICD-10-CM

## 2018-05-10 DIAGNOSIS — Z818 Family history of other mental and behavioral disorders: Secondary | ICD-10-CM

## 2018-05-10 DIAGNOSIS — F332 Major depressive disorder, recurrent severe without psychotic features: Principal | ICD-10-CM

## 2018-05-10 DIAGNOSIS — Z91410 Personal history of adult physical and sexual abuse: Secondary | ICD-10-CM

## 2018-05-10 DIAGNOSIS — Z590 Homelessness: Secondary | ICD-10-CM

## 2018-05-10 LAB — URINALYSIS, COMPLETE (UACMP) WITH MICROSCOPIC
BACTERIA UA: NONE SEEN
BILIRUBIN URINE: NEGATIVE
Glucose, UA: NEGATIVE mg/dL
HGB URINE DIPSTICK: NEGATIVE
Ketones, ur: NEGATIVE mg/dL
NITRITE: NEGATIVE
PROTEIN: NEGATIVE mg/dL
Specific Gravity, Urine: 1.015 (ref 1.005–1.030)
pH: 5 (ref 5.0–8.0)

## 2018-05-10 MED ORDER — ADULT MULTIVITAMIN W/MINERALS CH
1.0000 | ORAL_TABLET | Freq: Every day | ORAL | Status: DC
  Administered 2018-05-10 – 2018-05-15 (×6): 1 via ORAL
  Filled 2018-05-10 (×8): qty 1

## 2018-05-10 MED ORDER — QUETIAPINE FUMARATE 100 MG PO TABS
100.0000 mg | ORAL_TABLET | Freq: Every day | ORAL | Status: DC
  Administered 2018-05-11 – 2018-05-14 (×4): 100 mg via ORAL
  Filled 2018-05-10 (×6): qty 1

## 2018-05-10 MED ORDER — VITAMIN B-1 100 MG PO TABS
100.0000 mg | ORAL_TABLET | Freq: Every day | ORAL | Status: DC
  Administered 2018-05-11 – 2018-05-15 (×5): 100 mg via ORAL
  Filled 2018-05-10 (×6): qty 1

## 2018-05-10 MED ORDER — LORAZEPAM 1 MG PO TABS
1.0000 mg | ORAL_TABLET | Freq: Four times a day (QID) | ORAL | Status: AC | PRN
  Administered 2018-05-10 – 2018-05-12 (×2): 1 mg via ORAL
  Filled 2018-05-10: qty 1

## 2018-05-10 MED ORDER — HYDROXYZINE HCL 25 MG PO TABS
25.0000 mg | ORAL_TABLET | Freq: Four times a day (QID) | ORAL | Status: AC | PRN
  Filled 2018-05-10: qty 1

## 2018-05-10 NOTE — Progress Notes (Signed)
Recreation Therapy Notes  Date: 7.26.19 Time: 0930 Location: [address] Dayroom  Group Topic: Stress Management  Goal Area(s) Addresses:  Patient will verbalize importance of using healthy stress management.  Patient will identify positive emotions associated with healthy stress management.   Intervention: Stress Management  Activity : Progressive Muscle Relaxation.  LRT introduced the stress management technique of progressive muscle relaxation.  LRT read a script that guided patients through a series of tensing muscles and then relaxing.  Patients were to follow along as LRT read script to engage in the activity.  Education:  Stress Management, Discharge Planning.   Education Outcome: Acknowledges edcuation/In group clarification offered/Needs additional education  Clinical Observations/Feedback: Pt did not attend group.    Victorino Sparrow, LRT/CTRS         Ria Comment, Neilah Fulwider A 05/10/2018 11:07 AM

## 2018-05-10 NOTE — BHH Group Notes (Signed)
  [place] LCSW Group Therapy Note  Date/Time7/26/19, 1315  Type of Therapy/Topic:  Group Therapy:  Emotion Regulation  Participation Level:  Did Not Attend   Mood:  Description of Group:    The purpose of this group is to assist patients in learning to regulate negative emotions and experience positive emotions. Patients will be guided to discuss ways in which they have been vulnerable to their negative emotions. These vulnerabilities will be juxtaposed with experiences of positive emotions or situations, and patients challenged to use positive emotions to combat negative ones. Special emphasis will be placed on coping with negative emotions in conflict situations, and patients will process healthy conflict resolution skills.  Therapeutic Goals: 1. Patient will identify two positive emotions or experiences to reflect on in order to balance out negative emotions:  2. Patient will label two or more emotions that they find the most difficult to experience:  3. Patient will be able to demonstrate positive conflict resolution skills through discussion or role plays:   Summary of Patient Progress:       Therapeutic Modalities:   Cognitive Behavioral Therapy Feelings Identification Dialectical Behavioral Therapy  Lurline Idol, LCSW

## 2018-05-10 NOTE — Plan of Care (Signed)
D: Pt spent majority of evening sleep , pt too lethargic to receive Seroquel this evening.  A: Pt was offered support and encouragement. Pt was given scheduled medications. Pt was encourage to attend groups. Q 15 minute checks were done for safety.  R: safety maintained on unit.   Problem: Activity: Goal: Sleeping patterns will improve Outcome: Progressing   Problem: Safety: Goal: Periods of time without injury will increase Outcome: Progressing
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
## 2018-05-10 NOTE — Progress Notes (Signed)
D: Patient continues to endorse suicidal ideation without a specific plan.  Her affect is flat and blunted; her mood is depressed. Patient rates her depression, hopelessness and anxiety as a 10.  Her goal today is to "network; get phone number to [facility] and [facility]."  Patient plans to go to a treatment facility for alcohol abuse.  Patient has had one dose of ativan today; she has slept most of the afternoon.    A: Continue to monitor medication management and MD orders.  Safety checks completed every 15 minutes per protocol.  Offer support and encouragement as needed.  R: Patient is receptive to staff; her behavior is appropriate.

## 2018-05-10 NOTE — Tx Team (Signed)
Interdisciplinary Treatment and Diagnostic Plan Update  05/10/2018 Time of Session: [place] MRN: [id number]  Principal Diagnosis: <principal problem not specified>  Secondary Diagnoses: Active Problems:   MDD (major depressive disorder), recurrent severe, without psychosis ([place])   Current Medications:  Current Facility-Administered Medications  Medication Dose Route Frequency Provider Last Rate Last Dose  . carbamazepine (TEGRETOL) tablet 200 mg  200 mg Oral BID Rankin, Shuvon B, NP   200 mg at 05/10/18 0749  . hydrOXYzine (ATARAX/VISTARIL) tablet 25 mg  25 mg Oral Q6H PRN Cobos, Fernando A, MD      . ibuprofen (ADVIL,MOTRIN) tablet 600 mg  600 mg Oral Q6H PRN Cobos, Myer Peer, MD   600 mg at 05/09/18 1607  . LORazepam (ATIVAN) tablet 1 mg  1 mg Oral Q6H PRN Cobos, Myer Peer, MD   1 mg at 05/10/18 1201  . multivitamin with minerals tablet 1 tablet  1 tablet Oral Daily Cobos, Myer Peer, MD   1 tablet at 05/10/18 1203  . nicotine (NICODERM CQ - dosed in mg/24 hours) patch 21 mg  21 mg Transdermal Daily Cobos, Myer Peer, MD   21 mg at 05/10/18 0750  . nitroGLYCERIN (NITROSTAT) SL tablet 0.4 mg  0.4 mg Sublingual PRN Rankin, Shuvon B, NP      . QUEtiapine (SEROQUEL) tablet 100 mg  100 mg Oral QHS Cobos, Fernando A, MD      . sertraline (ZOLOFT) tablet 50 mg  50 mg Oral Daily Rankin, Shuvon B, NP   50 mg at 05/10/18 0749  . [START ON 05/11/2018] thiamine (VITAMIN B-1) tablet 100 mg  100 mg Oral Daily Cobos, Myer Peer, MD       PTA Medications: Medications Prior to Admission  Medication Sig Dispense Refill Last Dose  . carbamazepine (TEGRETOL) 200 MG tablet Take 200 mg by mouth 2 (two) times daily.   unknown  . meloxicam (MOBIC) 15 MG tablet Take 1 tablet (15 mg total) by mouth daily. 30 tablet 0 04/18/2018  . nitrofurantoin, macrocrystal-monohydrate, (MACROBID) 100 MG capsule Take 1 capsule (100 mg total) by mouth 2 (two) times daily. 10 capsule 0   . nitroGLYCERIN  (NITROSTAT) 0.4 MG SL tablet Place 0.4 mg under the tongue as needed.  0 unknown  . ondansetron (ZOFRAN) 4 MG tablet Take 1 tablet (4 mg total) by mouth every 8 (eight) hours as needed for nausea or vomiting. 10 tablet 0   . QUEtiapine (SEROQUEL) 100 MG tablet Take 1 tablet (100 mg total) by mouth at bedtime. (Patient taking differently: Take 200 mg by mouth at bedtime. ) 30 tablet 0 04/18/2018  . sertraline (ZOLOFT) 50 MG tablet Take 50 mg by mouth daily.   04/18/2018    Patient Stressors: Loss of mother Marital or family conflict Medication change or noncompliance Substance abuse  Patient Strengths: Ability for insight Average or above average intelligence Capable of independent living Communication skills General fund of knowledge  Treatment Modalities: Medication Management, Group therapy, Case management,  1 to 1 session with clinician, Psychoeducation, Recreational therapy.   Physician Treatment Plan for Primary Diagnosis: <principal problem not specified> Long Term Goal(s): Improvement in symptoms so as ready for discharge Improvement in symptoms so as ready for discharge   Short Term Goals: Ability to identify changes in lifestyle to reduce recurrence of condition will improve Ability to maintain clinical measurements within normal limits will improve Ability to identify changes in lifestyle to reduce recurrence of condition will improve Ability to maintain clinical  measurements within normal limits will improve  Medication Management: Evaluate patient's response, side effects, and tolerance of medication regimen.  Therapeutic Interventions: 1 to 1 sessions, Unit Group sessions and Medication administration.  Evaluation of Outcomes: Not Met  Physician Treatment Plan for Secondary Diagnosis: Active Problems:   MDD (major depressive disorder), recurrent severe, without psychosis ([place])  Long Term Goal(s): Improvement in symptoms so as ready for discharge Improvement in  symptoms so as ready for discharge   Short Term Goals: Ability to identify changes in lifestyle to reduce recurrence of condition will improve Ability to maintain clinical measurements within normal limits will improve Ability to identify changes in lifestyle to reduce recurrence of condition will improve Ability to maintain clinical measurements within normal limits will improve     Medication Management: Evaluate patient's response, side effects, and tolerance of medication regimen.  Therapeutic Interventions: 1 to 1 sessions, Unit Group sessions and Medication administration.  Evaluation of Outcomes: Not Met   RN Treatment Plan for Primary Diagnosis: <principal problem not specified> Long Term Goal(s): Knowledge of disease and therapeutic regimen to maintain health will improve  Short Term Goals: Ability to identify and develop effective coping behaviors will improve and Compliance with prescribed medications will improve  Medication Management: RN will administer medications as ordered by provider, will assess and evaluate patient's response and provide education to patient for prescribed medication. RN will report any adverse and/or side effects to prescribing provider.  Therapeutic Interventions: 1 on 1 counseling sessions, Psychoeducation, Medication administration, Evaluate responses to treatment, Monitor vital signs and CBGs as ordered, Perform/monitor CIWA, COWS, AIMS and Fall Risk screenings as ordered, Perform wound care treatments as ordered.  Evaluation of Outcomes: Not Met   LCSW Treatment Plan for Primary Diagnosis: <principal problem not specified> Long Term Goal(s): Safe transition to appropriate next level of care at discharge, Engage patient in therapeutic group addressing interpersonal concerns.  Short Term Goals: Engage patient in aftercare planning with referrals and resources, Increase social support and Increase skills for wellness and recovery  Therapeutic  Interventions: Assess for all discharge needs, 1 to 1 time with Social worker, Explore available resources and support systems, Assess for adequacy in community support network, Educate family and significant other(s) on suicide prevention, Complete Psychosocial Assessment, Interpersonal group therapy.  Evaluation of Outcomes: Not Met   Progress in Treatment: Attending groups: No. Participating in groups: No. Taking medication as prescribed: Yes. Toleration medication: Yes. Family/Significant other contact made: No, will contact:  when given permission Patient understands diagnosis: Yes. Discussing patient identified problems/goals with staff: Yes. Medical problems stabilized or resolved: Yes. Denies suicidal/homicidal ideation: Yes. Issues/concerns per patient self-inventory: No Other: none  New problem(s) identified: No, Describe:  none  New Short Term/Long Term Goal(s):  Patient Goals:  "get back on my medicine, treatment for alcohol, finding stability"  Discharge Plan or Barriers:   Reason for Continuation of Hospitalization: Depression Medication stabilization  Estimated Length of Stay: 3-5 days    Attendees: Patient: Katrina Kelley 05/10/2018   Physician: Dr. Parke Poisson, MD 05/10/2018   Nursing: Sena Hitch, RN 05/10/2018   RN Care Manager: 05/10/2018   Social Worker: Lurline Idol, LCSW 05/10/2018   Recreational Therapist:  05/10/2018   Other:  05/10/2018   Other:  05/10/2018   Other: 05/10/2018     Scribe for Treatment Team: Joanne Chars, [place] 05/10/2018 12:48 PM
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
## 2018-05-11 LAB — LIPID PANEL
CHOL/HDL RATIO: 2.8 ratio
CHOLESTEROL: 116 mg/dL (ref 0–200)
HDL: 42 mg/dL (ref >40)
LDL Cholesterol: 55 mg/dL (ref 0–99)
TRIGLYCERIDES: 96 mg/dL (ref <150)
VLDL: 19 mg/dL (ref 0–40)

## 2018-05-11 LAB — CBC WITH DIFFERENTIAL/PLATELET
BASOS ABS: 0 10*3/uL (ref 0.0–0.1)
Basophils Relative: 1 %
EOS ABS: 0.2 10*3/uL (ref 0.0–0.7)
EOS PCT: 5 %
HCT: 41.4 % (ref 36.0–46.0)
HEMOGLOBIN: 13.9 g/dL (ref 12.0–15.0)
LYMPHS ABS: 2.1 10*3/uL (ref 0.7–4.0)
LYMPHS PCT: 51 %
MCH: 32 pg (ref 26.0–34.0)
MCHC: 33.6 g/dL (ref 30.0–36.0)
MCV: 95.2 fL (ref 78.0–100.0)
Monocytes Absolute: 0.6 10*3/uL (ref 0.1–1.0)
Monocytes Relative: 15 %
NEUTROS PCT: 28 %
Neutro Abs: 1.1 10*3/uL — ABNORMAL LOW (ref 1.7–7.7)
PLATELETS: 191 10*3/uL (ref 150–400)
RBC: 4.35 MIL/uL (ref 3.87–5.11)
RDW: 13.8 % (ref 11.5–15.5)
WBC: 4.1 10*3/uL (ref 4.0–10.5)

## 2018-05-11 LAB — BASIC METABOLIC PANEL
ANION GAP: 5 (ref 5–15)
BUN: 16 mg/dL (ref 8–23)
CHLORIDE: 106 mmol/L (ref 98–111)
CO2: 27 mmol/L (ref 22–32)
Calcium: 8.7 mg/dL — ABNORMAL LOW (ref 8.9–10.3)
Creatinine, Ser: 0.73 mg/dL (ref 0.44–1.00)
Glucose, Bld: 92 mg/dL (ref 70–99)
POTASSIUM: 4.1 mmol/L (ref 3.5–5.1)
SODIUM: 138 mmol/L (ref 135–145)

## 2018-05-11 LAB — TSH: TSH: 0.442 u[IU]/mL (ref 0.350–4.500)

## 2018-05-11 LAB — HEMOGLOBIN A1C
HEMOGLOBIN A1C: 5.6 % (ref 4.8–5.6)
Mean Plasma Glucose: 114.02 mg/dL

## 2018-05-11 NOTE — Progress Notes (Signed)
Adult Psychoeducational Group Note  Date:  05/11/2018 Time:  12:55 AM  Group Topic/Focus:  Wrap-Up Group:   The focus of this group is to help patients review their daily goal of treatment and discuss progress on daily workbooks.  Participation Level:  Active  Participation Quality:  Appropriate  Affect:  Lethargic  Cognitive:  Appropriate  Insight: Appropriate  Engagement in Group:  Engaged  Modes of Intervention:  Discussion  Additional Comments:  Pt stated she woke up in funk, suicidal, bad dreams,  anxiety is up and her appetite has diminished.  However, she did reach her goal of reaching out to some outpatient facilities for her once she leaves the hospital.  Pt rated the day at 2/10.  Elzie Knisley 05/11/2018, 12:55 AM

## 2018-05-11 NOTE — Progress Notes (Signed)
The patient verbalized in group that she had a pretty good day but did not go into further detail. She did mention that she did not receive any visitors . Her goal for tomorrow is to work on her "spiritual maintenance".
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
## 2018-05-11 NOTE — Progress Notes (Signed)
D   Pt up and visible on the milieu   She endorses some depression and denies withdrawal symptoms   She interacts minimally but appropriately with others   She did say she felt real tired and wanted to get her bedtime medications early so she could go to sleep A   Verbal support given    Medications administered and effectiveness monitored    Q 15 min checks  R  Pt is safe at present time

## 2018-05-12 LAB — CARBAMAZEPINE LEVEL, TOTAL: Carbamazepine Lvl: 7.2 ug/mL (ref 4.0–12.0)

## 2018-05-12 MED ORDER — SERTRALINE HCL 100 MG PO TABS
100.0000 mg | ORAL_TABLET | Freq: Every day | ORAL | Status: DC
  Administered 2018-05-13 – 2018-05-15 (×3): 100 mg via ORAL
  Filled 2018-05-12 (×4): qty 1

## 2018-05-12 NOTE — Progress Notes (Signed)
D: Patient's affect is flat, blunted; her mood is depressed.  She continues to ruminate over where she will go after discharge.  Patient states she has suicidal thoughts "almost all the time."  She rates her hopelessness as a 7.  She continues to report poor sleep; her appetite is good.  Patient states, "I feel like something is crawling on me."  Patient was given ativan 1 mg po.  She was given ibuprofen for pain.    A: Continue to monitor medication management and MD order.  Safety checks continued every 15 minutes per protocol.  Offer support and encouragement as needed.  R: Patient is receptive to staff; her behavior is appropriate.

## 2018-05-12 NOTE — [facility] Group Notes (Signed)
LCSW Group Therapy Note  05/12/2018    10:00 - 10:50 AM               Type of Therapy and Topic:  Group Therapy: Understanding Depression / Exploring Positive Ways to Cope  Participation Level:  Did not attend   Description of Group:   In this group session, patients learned how to define depression as well as recognize the difference between depression and sadness. Patients talked about how typically people respond with one of the 3 E's - escape, explode or express. Patients defined depression, analyzed the symptoms of their depression and how depression affects their life currently. Patients were asked to scale their depression. Patients explored triggers and current coping techniques for depression. Patient's talked about self sabotage briefly. Patients were introduced to several positive coping skills as well as emotional regulation skills.Patients talked about the importance of support with depression and different types of supports. Patients were asked to complete an assessment worksheet analyzing their current supports and those that they wish to have in the future.   Therapeutic Goals: 1. Patients will explore depression - the definition, symptoms, triggers,and how it affects their life.  2. Patients will discuss the importance of support with an emphasis on therapy and medications.  3. Patients will learn three emotional regulation skills (opposite action, check the facts, PLEASE) 4. Patients were encouraged to close on a positive note, sharing their happy place with the group - another coping technique.  Summary of Patient Progress:  Patient was not present for group despite MHT prompting.   Therapeutic Modalities:   Cognitive Behavioral Therapy Motivational Interviewing  Brief Therapy  Tye Savoy, LCSW  05/12/2018

## 2018-05-12 NOTE — [facility] Group Notes (Signed)
[facility] Group Notes:  (Nursing/MHT/Case Management/Adjunct)  Date:  05/12/2018  Time:  4:16 PM  Type of Therapy:  Psychoeducational Skills  Participation Level:  Did Not Attend  Patient invited; declined.  Zipporah Plants 05/12/2018, 4:16 PM

## 2018-05-12 NOTE — Plan of Care (Signed)
  Problem: Education: Goal: Knowledge of [facility] Education information/materials will improve Outcome: Progressing Goal: Emotional status will improve Outcome: Progressing Goal: Mental status will improve Outcome: Progressing Goal: Verbalization of understanding the information provided will improve Outcome: Progressing   Problem: Coping: Goal: Ability to demonstrate self-control will improve Outcome: Progressing   Problem: Health Behavior/Discharge Planning: Goal: Compliance with treatment plan for underlying cause of condition will improve Outcome: Progressing   Problem: Safety: Goal: Periods of time without injury will increase Outcome: Progressing   Problem: Education: Goal: Emotional status will improve Outcome: Progressing   Problem: Health Behavior/Discharge Planning: Goal: Identification of resources available to assist in meeting health care needs will improve Outcome: Not Progressing   Problem: Health Behavior/Discharge Planning: Goal: Identification of resources available to assist in meeting health care needs will improve Outcome: Not Progressing
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
## 2018-05-13 NOTE — Progress Notes (Signed)
D: Patient's affect is brighter today; she is sleeping well.  Her appetite is good.  She rates her depression, hopelessness and anxiety as a 5.  Her goal today is to "call shelters and inpatient treatment center for exit plan."  Patient does not endorse suicidal ideation today.  She is pleasant and is interacting well with her peers.  She is attending groups and participating in her treatment.  A: Continue to monitor medication management and MD orders.  Safety checks completed every 15 minutes per protocol.  Offer support and encouragement as needed.  R: Patient is receptive to staff; her behavior is appropriate.

## 2018-05-13 NOTE — Progress Notes (Signed)
Pt reports her day was "ok".  She spent the first part of the shift in bed asleep.  She contracts for Probation officer.  She denies HI/AVH.  She says she has been working on getting into a shelter after discharge.  She feels the meds are working for her.  She voiced no needs or concerns this evening.  Support and encouragement offered.  Discharge plans are in process.  Safety maintained with q15 minute checks.
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
## 2018-05-14 DIAGNOSIS — F1490 Cocaine use, unspecified, uncomplicated: Secondary | ICD-10-CM

## 2018-05-14 DIAGNOSIS — F17210 Nicotine dependence, cigarettes, uncomplicated: Secondary | ICD-10-CM

## 2018-05-14 LAB — HEPATIC FUNCTION PANEL
ALBUMIN: 3.2 g/dL — AB (ref 3.5–5.0)
ALT: 46 U/L — ABNORMAL HIGH (ref 0–44)
AST: 53 U/L — ABNORMAL HIGH (ref 15–41)
Alkaline Phosphatase: 61 U/L (ref 38–126)
BILIRUBIN DIRECT: 0.2 mg/dL (ref 0.0–0.2)
BILIRUBIN TOTAL: 0.3 mg/dL (ref 0.3–1.2)
Indirect Bilirubin: 0.1 mg/dL — ABNORMAL LOW (ref 0.3–0.9)
Total Protein: 7.5 g/dL (ref 6.5–8.1)

## 2018-05-14 NOTE — Progress Notes (Signed)
[facility] MD Progress Note  05/14/2018 11:59 AM Katrina Kelley  MRN:  [id number] Subjective: Patient is seen and examined.  Patient is a 62 year old female with a past psychiatric history significant for major depression, severe and recurrent.  She also has a history of alcohol abuse, PTSD, and is also homeless.  He is seen in follow-up.  She stated she is doing better today.  She stated her mood feels better.  She denied any auditory or visual hallucinations.  She stated that her suicidal thoughts were decreasing.  She was looking forward to discharge.  She stated that the Tegretol makes her a bit sedated during the day, but overall is tolerating that.  Her Tegretol level on 7/28 was 7.2.  She denied any other complaints today. Principal Problem: <principal problem not specified> Diagnosis:   Patient Active Problem List   Diagnosis Date Noted  . MDD (major depressive disorder), recurrent severe, without psychosis ([place]) [F33.2] 05/09/2018  . Cocaine abuse with cocaine-induced mood disorder ([place]) [F14.14] 01/29/2018  . 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[place]) [F14.159] 01/29/2018  . Alcohol use disorder, moderate, dependence ([place]) [F10.20] 03/17/2016  . Cocaine use disorder, severe, dependence ([place]) [F14.20] 03/17/2016  . Substance induced mood disorder ([place]) [F19.94] 03/17/2016  . Chronic foot pain [M79.673, G89.29] 02/07/2016  . Osteoarthrosis of ankle and foot [M19.079] 02/07/2016  . Appendicitis [K37] 03/12/2014  . Major depression, recurrent, chronic ([place]) [F33.9] 08/20/2012   Total Time spent with patient: 15 minutes  Past Psychiatric History: See admission H&P  Past Medical History:  Past Medical History:  Diagnosis Date  . Anxiety   . Arthritis   . Bipolar 1 disorder ([place])   . Depression   . Schizoaffective disorder [facility])     Past Surgical History:  Procedure Laterality Date  . ABDOMINAL HYSTERECTOMY    . ELEVATION OF DEPRESSED SKULL FRACTURE    . FOOT  ARTHRODESIS     rt and lt  . FRACTURE SURGERY  2005   fx both lower legs   . LAPAROSCOPIC APPENDECTOMY N/A 03/12/2014   Procedure: APPENDECTOMY LAPAROSCOPIC;  Surgeon: [name], MD;  Location: [place];  Service: General;  Laterality: N/A;  . TOOTH EXTRACTION  11/27/2011   Procedure: DENTAL RESTORATION/EXTRACTIONS;  Surgeon: [name], DDS;  Location: [place];  Service: Oral Surgery;  Laterality: N/A;  just extractions   Family History:  Family History  Problem Relation Age of Onset  . Hyperlipidemia Son    Family Psychiatric  History: See admission H&P Social History:  Social History   Substance and Sexual Activity  Alcohol Use Yes   Comment: 18 beers and however much alcohol she can get     Social History   Substance and Sexual Activity  Drug Use Yes  . Types: Cocaine   Comment: states quit alcohol and cocaine 10 years ago     Social History   Socioeconomic History  . Marital status: Legally Separated    Spouse name: Not on file  . Number of children: Not on file  . Years of education: Not on file  . Highest education level: Not on file  Occupational History  . Not on file  Social Needs  . Financial resource strain: Not on file  . Food insecurity:    Worry: Not on file    Inability: Not on file  . Transportation needs:    Medical: Not on file    Non-medical: Not on file  Tobacco Use  .  Smoking status: Current Every Day Smoker    Packs/day: 0.50    Years: 38.00    Pack years: 19.00    Types: Cigarettes    Last attempt to quit: 03/22/2017    Years since quitting: 1.1  . Smokeless tobacco: Never Used  Substance and Sexual Activity  . Alcohol use: Yes    Comment: 18 beers and however much alcohol she can get  . Drug use: Yes    Types: Cocaine    Comment: states quit alcohol and cocaine 10 years ago   . Sexual activity: Not Currently    Birth control/protection: Condom    Comment: Pt. refused smoking cessation information  Lifestyle   . Physical activity:    Days per week: Not on file    Minutes per session: Not on file  . Stress: Not on file  Relationships  . Social connections:    Talks on phone: Not on file    Gets together: Not on file    Attends religious service: Not on file    Active member of club or organization: Not on file    Attends meetings of clubs or organizations: Not on file    Relationship status: Not on file  Other Topics Concern  . Not on file  Social History Narrative  . Not on file   Additional Social History:    Pain Medications: please see mar Prescriptions: please see mar Over the Counter: please see mar History of alcohol / drug use?: Yes Longest period of sobriety (when/how long): unknown Name of Substance 1: cocaine                  Sleep: Good  Appetite:  Good  Current Medications: Current Facility-Administered Medications  Medication Dose Route Frequency Provider Last Rate Last Dose  . carbamazepine (TEGRETOL) tablet 200 mg  200 mg Oral BID Rankin, Shuvon B, NP   200 mg at 05/14/18 0748  . ibuprofen (ADVIL,MOTRIN) tablet 600 mg  600 mg Oral Q6H PRN Cobos, Myer Peer, MD   600 mg at 05/12/18 0801  . multivitamin with minerals tablet 1 tablet  1 tablet Oral Daily Cobos, Myer Peer, MD   1 tablet at 05/14/18 0747  . nicotine (NICODERM CQ - dosed in mg/24 hours) patch 21 mg  21 mg Transdermal Daily Cobos, Myer Peer, MD   21 mg at 05/12/18 0802  . nitroGLYCERIN (NITROSTAT) SL tablet 0.4 mg  0.4 mg Sublingual PRN Rankin, Shuvon B, NP      . QUEtiapine (SEROQUEL) tablet 100 mg  100 mg Oral QHS Cobos, Myer Peer, MD   100 mg at 05/13/18 2105  . sertraline (ZOLOFT) tablet 100 mg  100 mg Oral Daily Nanci Pina, FNP   100 mg at 05/14/18 0747  . thiamine (VITAMIN B-1) tablet 100 mg  100 mg Oral Daily Cobos, Myer Peer, MD   100 mg at 05/14/18 2952    Lab Results: No results found for this or any previous visit (from the past 48 hour(s)).  Blood Alcohol level:  Lab  Results  Component Value Date   ETH <10 04/21/2018   ETH <10 84/13/2440    Metabolic Disorder Labs: Lab Results  Component Value Date   HGBA1C 5.6 05/11/2018   MPG 114.02 05/11/2018   MPG 108 12/25/2008   No results found for: PROLACTIN Lab Results  Component Value Date   CHOL 116 05/11/2018   TRIG 96 05/11/2018   HDL 42 05/11/2018   CHOLHDL 2.8  05/11/2018   VLDL 19 05/11/2018   LDLCALC 55 05/11/2018   LDLCALC NOT CALCULATED 12/25/2008    Physical Findings: AIMS: Facial and Oral Movements Muscles of Facial Expression: None, normal Lips and Perioral Area: None, normal Jaw: None, normal Tongue: None, normal,Extremity Movements Upper (arms, wrists, hands, fingers): None, normal Lower (legs, knees, ankles, toes): None, normal, Trunk Movements Neck, shoulders, hips: None, normal, Overall Severity Severity of abnormal movements (highest score from questions above): None, normal Incapacitation due to abnormal movements: None, normal Patient's awareness of abnormal movements (rate only patient's report): No Awareness, Dental Status Current problems with teeth and/or dentures?: No Does patient usually wear dentures?: Yes(does not have them on admission)  CIWA:  CIWA-Ar Total: 2 COWS:     Musculoskeletal: Strength & Muscle Tone: within normal limits Gait & Station: normal Patient leans: N/A  Psychiatric Specialty Exam: Physical Exam  Nursing note and vitals reviewed. Constitutional: She appears well-developed and well-nourished.  HENT:  Head: Normocephalic and atraumatic.  Respiratory: Effort normal.  Neurological: She is alert.    ROS  Blood pressure 115/80, pulse 67, temperature 99.1 F (37.3 C), temperature source Oral, resp. rate 16, height 5\' 8"  (1.727 m), weight 84.4 kg (186 lb), last menstrual period 03/19/1985, SpO2 98 %.Body mass index is 28.28 kg/m.  General Appearance: Fairly Groomed  Eye Contact:  Fair  Speech:  Normal Rate  Volume:  Normal  Mood:   Anxious  Affect:  Congruent  Thought Process:  Coherent  Orientation:  Full (Time, Place, and Person)  Thought Content:  Logical  Suicidal Thoughts:  No  Homicidal Thoughts:  No  Memory:  Immediate;   Fair Recent;   Fair Remote;   Fair  Judgement:  Intact  Insight:  Fair  Psychomotor Activity:  Normal  Concentration:  Concentration: Fair and Attention Span: Fair  Recall:  [employer] of Knowledge:  Fair  Language:  Fair  Akathisia:  Negative  Handed:  Right  AIMS (if indicated):     Assets:  Desire for Improvement Resilience  ADL's:  Intact  Cognition:  WNL  Sleep:  Number of Hours: 6.75     Treatment Plan Summary: Daily contact with patient to assess and evaluate symptoms and progress in treatment, Medication management and Plan : Patient is seen and examined.  Patient is a 62 year old female with the above-stated past psychiatric history is seen in follow-up.  She stated that the decrease of the Seroquel from 200 mg to 100 mg decreased her sedation and she appreciated that.  She continues on Zoloft 100 mg p.o. daily and Tegretol 200 mg p.o. twice daily.  Her Tegretol level is within normal limits.  Her liver function enzymes are stable.  No change in her current medications.  Social work is working on getting her a place to stay, and and if she can go we will try and discharge her in the next 1 to 2 days.  No changes in her current medications.  Sharma Covert, MD 05/14/2018, 11:59 AM

## 2018-05-14 NOTE — Progress Notes (Signed)
Patient self inventory- Patient slept well last night, sleep medication was requested and it was helpful. Appetite has been good, energy level normal, and concentration good. Depression, hopelessness, and anxiety all rated 7 out of 10 with 10 being the highest. Denies withdrawal symptoms. Denies SI HI AVH. Denies physical pain and physical problems. Patient's goal is "getting into a shelter."  Patient compliant with medications prescribed per provider. So questions or concerns about medications.  Safety maintained with 15 minute checks. Will continue to monitor.

## 2018-05-14 NOTE — Progress Notes (Signed)
Pt was observed in the dayroom, attending wrap-up group. Pt appears depressed/flat in affect and mood. Pt denies SI/HI/AVH/Pain at this time. Pt states she is worried about placement once discharge. Pt states she called all the shelters today but didn't received any new information. Pt is pleasant on the unit. Pt stays to self. Will continue with POC.

## 2018-05-14 NOTE — Plan of Care (Signed)
  Problem: Education: Goal: Knowledge of [facility] Education information/materials will improve Outcome: Progressing   Problem: Education: Goal: Emotional status will improve Outcome: Progressing   Problem: Education: Goal: Mental status will improve Outcome: Progressing   Problem: Education: Goal: Verbalization of understanding the information provided will improve Outcome: Progressing

## 2018-05-14 NOTE — Progress Notes (Signed)
Pt reports she had an "ok" day.  She continues to present flat/depressed.  She is aware that she will probably discharge tomorrow to a shelter and she is ok with that plan.  She voiced no needs or concerns.  She has been in the dayroom talking minimally with other patients.  She makes her needs known to staff.  Support and encouragement offered.  Discharge plans are in process.  Safety maintained with q15 minute checks.

## 2018-05-14 NOTE — [facility] Group Notes (Signed)
[facility] Group Notes:  (Nursing/MHT/Case Management/Adjunct)  Date:  05/14/2018  Time:  4:00 PM  Type of Therapy:  Nurse Education  Participation Level:  Active  Participation Quality:  Appropriate and Sharing  Affect:  Appropriate  Cognitive:  Appropriate  Insight:  Appropriate  Engagement in Group:  Developing/Improving and Engaged  Modes of Intervention:  Problem-solving and Socialization  Summary of Progress/Problems: Patient was attentive and supportive when other patients were sharing their stories.  Megan Mans 05/14/2018, 4:54 PM

## 2018-05-14 NOTE — Progress Notes (Signed)
Recreation Therapy Notes  Animal-Assisted Activity (AAA) Program Checklist/Progress Notes Patient Eligibility Criteria Checklist & Daily Group note for Rec Tx Intervention  Date: 7.30.19 Time: 33 Location: [address]   AAA/T Program Assumption of Risk Form signed by Teacher, music or Parent Legal Guardian YES   Patient is free of allergies or sever asthma YES  Patient reports no fear of animals  YES  Patient reports no history of cruelty to animals  YES  Patient understands his/her participation is voluntary  YES   Patient washes hands before animal contact  YES   Patient washes hands after animal contact  YES   Education: Contractor, Appropriate Animal Interaction   Education Outcome: Acknowledges understanding/In group clarification offered/Needs additional education.   Clinical Observations/Feedback: Pt did not attend group.    Victorino Sparrow, LRT/CTRS         Victorino Sparrow A 05/14/2018 3:57 PM

## 2018-05-14 NOTE — [facility] Suicide Risk Assessment (Signed)
[facility] INPATIENT:  Family/Significant Other Suicide Prevention Education  Suicide Prevention Education:   SPE completed with patient, as patient refused to consent to family contact. SPI pamphlet provided to pt and pt was encouraged to share information with support network, ask questions, and talk about any concerns relating to SPE. Patient denies access to guns/firearms and verbalized understanding of information provided. Mobile Crisis information also provided to patient.   

## 2018-05-15 ENCOUNTER — Encounter (HOSPITAL_COMMUNITY): Payer: Self-pay | Admitting: Behavioral Health

## 2018-05-15 MED ORDER — NITROGLYCERIN 0.4 MG SL SUBL
0.4000 mg | SUBLINGUAL_TABLET | SUBLINGUAL | 0 refills | Status: DC | PRN
Start: 2018-05-15 — End: 2018-05-15

## 2018-05-15 MED ORDER — SERTRALINE HCL 100 MG PO TABS: 100.0000 mg | ORAL_TABLET | Freq: Every day | ORAL | 0 refills | Status: DC

## 2018-05-15 MED ORDER — NITROGLYCERIN 0.4 MG SL SUBL: 0.4000 mg | SUBLINGUAL_TABLET | SUBLINGUAL | 0 refills | Status: AC | PRN

## 2018-05-15 MED ORDER — THIAMINE HCL 100 MG PO TABS: 100.0000 mg | ORAL_TABLET | Freq: Every day | ORAL | 0 refills | Status: AC

## 2018-05-15 MED ORDER — CARBAMAZEPINE 200 MG PO TABS: 200.0000 mg | ORAL_TABLET | Freq: Two times a day (BID) | ORAL | 0 refills | Status: AC

## 2018-05-15 MED ORDER — QUETIAPINE FUMARATE 100 MG PO TABS: 100.0000 mg | ORAL_TABLET | Freq: Every day | ORAL | 0 refills | Status: AC

## 2018-05-15 NOTE — Plan of Care (Signed)
Patient self inventory- Pateint slept well last night, sleep medication was requested and it was helpful. Appetite has been good, energy level normal, concentration good. Depression, hopelessness, and anxiety all rated 6 out of 10 with 10 being the highest. Denies SI HI AVH. Safety maintained with 15 minute checks. Will continue to monitor.    Problem: Education: Goal: Knowledge of [place] [facility] Education information/materials will improve Outcome: Progressing   Problem: Education: Goal: Emotional status will improve Outcome: Progressing   Problem: Activity: Goal: Interest or engagement in activities will improve Outcome: Progressing   Problem: Activity: Goal: Sleeping patterns will improve Outcome: Progressing

## 2018-05-15 NOTE — Plan of Care (Signed)
Patient verbalizes readiness for discharge. Follow up plan explained, AVS, Transition record and SRA given. Prescriptions and teaching provided. Belongings returned and signed for. Suicide safety plan completed and signed. Patient verbalizes understanding. Patient denies SI/HI and assures this Probation officer he will seek assistance should that change. Patient discharged to lobby where ride was waiting.  Problem: Education: Goal: Knowledge of [place] [facility] Education information/materials will improve Outcome: Adequate for Discharge Goal: Emotional status will improve Outcome: Adequate for Discharge Goal: Mental status will improve Outcome: Adequate for Discharge Goal: Verbalization of understanding the information provided will improve Outcome: Adequate for Discharge   Problem: Activity: Goal: Interest or engagement in activities will improve Outcome: Adequate for Discharge Goal: Sleeping patterns will improve Outcome: Adequate for Discharge   Problem: Coping: Goal: Ability to verbalize frustrations and anger appropriately will improve Outcome: Adequate for Discharge Goal: Ability to demonstrate self-control will improve Outcome: Adequate for Discharge   Problem: Health Behavior/Discharge Planning: Goal: Identification of resources available to assist in meeting health care needs will improve Outcome: Adequate for Discharge Goal: Compliance with treatment plan for underlying cause of condition will improve Outcome: Adequate for Discharge   Problem: Physical Regulation: Goal: Ability to maintain clinical measurements within normal limits will improve Outcome: Adequate for Discharge   Problem: Safety: Goal: Periods of time without injury will increase Outcome: Adequate for Discharge   Problem: Education: Goal: Knowledge of [place] [facility] Education information/materials will improve Outcome: Adequate for Discharge Goal: Emotional status will improve Outcome: Adequate for  Discharge Goal: Mental status will improve Outcome: Adequate for Discharge Goal: Verbalization of understanding the information provided will improve Outcome: Adequate for Discharge   Problem: Activity: Goal: Interest or engagement in activities will improve Outcome: Adequate for Discharge Goal: Sleeping patterns will improve Outcome: Adequate for Discharge   Problem: Coping: Goal: Ability to verbalize frustrations and anger appropriately will improve Outcome: Adequate for Discharge Goal: Ability to demonstrate self-control will improve Outcome: Adequate for Discharge   Problem: Health Behavior/Discharge Planning: Goal: Identification of resources available to assist in meeting health care needs will improve Outcome: Adequate for Discharge Goal: Compliance with treatment plan for underlying cause of condition will improve Outcome: Adequate for Discharge   Problem: Physical Regulation: Goal: Ability to maintain clinical measurements within normal limits will improve Outcome: Adequate for Discharge   Problem: Safety: Goal: Periods of time without injury will increase Outcome: Adequate for Discharge   Problem: Education: Goal: Ability to make informed decisions regarding treatment will improve Outcome: Adequate for Discharge   Problem: Coping: Goal: Coping ability will improve Outcome: Adequate for Discharge   Problem: Health Behavior/Discharge Planning: Goal: Identification of resources available to assist in meeting health care needs will improve Outcome: Adequate for Discharge   Problem: Medication: Goal: Compliance with prescribed medication regimen will improve Outcome: Adequate for Discharge   Problem: Self-Concept: Goal: Ability to disclose and discuss suicidal ideas will improve Outcome: Adequate for Discharge Goal: Will verbalize positive feelings about self Outcome: Adequate for Discharge   Problem: Education: Goal: Utilization of techniques to improve  thought processes will improve Outcome: Adequate for Discharge Goal: Knowledge of the prescribed therapeutic regimen will improve Outcome: Adequate for Discharge   Problem: Activity: Goal: Interest or engagement in leisure activities will improve Outcome: Adequate for Discharge Goal: Imbalance in normal sleep/wake cycle will improve Outcome: Adequate for Discharge   Problem: Coping: Goal: Coping ability will improve Outcome: Adequate for Discharge Goal: Will verbalize feelings Outcome: Adequate for Discharge   Problem: Health Behavior/Discharge  Planning: Goal: Ability to make decisions will improve Outcome: Adequate for Discharge Goal: Compliance with therapeutic regimen will improve Outcome: Adequate for Discharge   Problem: Role Relationship: Goal: Will demonstrate positive changes in social behaviors and relationships Outcome: Adequate for Discharge   Problem: Safety: Goal: Ability to disclose and discuss suicidal ideas will improve Outcome: Adequate for Discharge Goal: Ability to identify and utilize support systems that promote safety will improve Outcome: Adequate for Discharge   Problem: Self-Concept: Goal: Will verbalize positive feelings about self Outcome: Adequate for Discharge Goal: Level of anxiety will decrease Outcome: Adequate for Discharge   Problem: Education: Goal: Knowledge of disease or condition will improve Outcome: Adequate for Discharge Goal: Understanding of discharge needs will improve Outcome: Adequate for Discharge   Problem: Health Behavior/Discharge Planning: Goal: Ability to identify changes in lifestyle to reduce recurrence of condition will improve Outcome: Adequate for Discharge Goal: Identification of resources available to assist in meeting health care needs will improve Outcome: Adequate for Discharge   Problem: Physical Regulation: Goal: Complications related to the disease process, condition or treatment will be avoided or  minimized Outcome: Adequate for Discharge   Problem: Safety: Goal: Ability to remain free from injury will improve Outcome: Adequate for Discharge

## 2018-05-15 NOTE — Tx Team (Signed)
Interdisciplinary Treatment and Diagnostic Plan Update  05/15/2018 Time of Session: 0630 Katrina Kelley MRN: [id number]  Principal Diagnosis: MDD (major depressive disorder), recurrent severe, without psychosis ([place])  Secondary Diagnoses: Principal Problem:   MDD (major depressive disorder), recurrent severe, without psychosis ([place])   Current Medications:  Current Facility-Administered Medications  Medication Dose Route Frequency Provider Last Rate Last Dose  . carbamazepine (TEGRETOL) tablet 200 mg  200 mg Oral BID Rankin, Shuvon B, NP   200 mg at 05/15/18 0757  . ibuprofen (ADVIL,MOTRIN) tablet 600 mg  600 mg Oral Q6H PRN Cobos, Myer Peer, MD   600 mg at 05/15/18 0757  . multivitamin with minerals tablet 1 tablet  1 tablet Oral Daily Cobos, Myer Peer, MD   1 tablet at 05/15/18 0757  . nicotine (NICODERM CQ - dosed in mg/24 hours) patch 21 mg  21 mg Transdermal Daily Cobos, Myer Peer, MD   21 mg at 05/12/18 0802  . nitroGLYCERIN (NITROSTAT) SL tablet 0.4 mg  0.4 mg Sublingual PRN Rankin, Shuvon B, NP      . QUEtiapine (SEROQUEL) tablet 100 mg  100 mg Oral QHS Cobos, Myer Peer, MD   100 mg at 05/14/18 2059  . sertraline (ZOLOFT) tablet 100 mg  100 mg Oral Daily Nanci Pina, FNP   100 mg at 05/15/18 0757  . thiamine (VITAMIN B-1) tablet 100 mg  100 mg Oral Daily Cobos, Myer Peer, MD   100 mg at 05/15/18 5573   Current Outpatient Medications  Medication Sig Dispense Refill  . carbamazepine (TEGRETOL) 200 MG tablet Take 1 tablet (200 mg total) by mouth 2 (two) times daily. 60 tablet 0  . nitroGLYCERIN (NITROSTAT) 0.4 MG SL tablet Place 1 tablet (0.4 mg total) under the tongue as needed for chest pain. 30 tablet 0  . QUEtiapine (SEROQUEL) 100 MG tablet Take 1 tablet (100 mg total) by mouth at bedtime. 30 tablet 0  . [START ON 05/16/2018] sertraline (ZOLOFT) 100 MG tablet Take 1 tablet (100 mg total) by mouth daily. 30 tablet 0  . [START ON 05/16/2018] thiamine 100 MG tablet Take 1  tablet (100 mg total) by mouth daily. 30 tablet 0   PTA Medications: No medications prior to admission.    Patient Stressors: Loss of mother Marital or family conflict Medication change or noncompliance Substance abuse  Patient Strengths: Ability for insight Average or above average intelligence Capable of independent living Communication skills General fund of knowledge  Treatment Modalities: Medication Management, Group therapy, Case management,  1 to 1 session with clinician, Psychoeducation, Recreational therapy.   Physician Treatment Plan for Primary Diagnosis: MDD (major depressive disorder), recurrent severe, without psychosis ([place]) Long Term Goal(s): Improvement in symptoms so as ready for discharge Improvement in symptoms so as ready for discharge   Short Term Goals: Ability to identify changes in lifestyle to reduce recurrence of condition will improve Ability to maintain clinical measurements within normal limits will improve Ability to identify changes in lifestyle to reduce recurrence of condition will improve Ability to maintain clinical measurements within normal limits will improve  Medication Management: Evaluate patient's response, side effects, and tolerance of medication regimen.  Therapeutic Interventions: 1 to 1 sessions, Unit Group sessions and Medication administration.  Evaluation of Outcomes: Adequate for Discharge  Physician Treatment Plan for Secondary Diagnosis: Principal Problem:   MDD (major depressive disorder), recurrent severe, without psychosis ([place])  Long Term Goal(s): Improvement in symptoms so as ready for discharge Improvement in symptoms so as ready  for discharge   Short Term Goals: Ability to identify changes in lifestyle to reduce recurrence of condition will improve Ability to maintain clinical measurements within normal limits will improve Ability to identify changes in lifestyle to reduce recurrence of condition will  improve Ability to maintain clinical measurements within normal limits will improve     Medication Management: Evaluate patient's response, side effects, and tolerance of medication regimen.  Therapeutic Interventions: 1 to 1 sessions, Unit Group sessions and Medication administration.  Evaluation of Outcomes: Adequate for Discharge   RN Treatment Plan for Primary Diagnosis: MDD (major depressive disorder), recurrent severe, without psychosis ([place]) Long Term Goal(s): Knowledge of disease and therapeutic regimen to maintain health will improve  Short Term Goals: Ability to identify and develop effective coping behaviors will improve and Compliance with prescribed medications will improve  Medication Management: RN will administer medications as ordered by provider, will assess and evaluate patient's response and provide education to patient for prescribed medication. RN will report any adverse and/or side effects to prescribing provider.  Therapeutic Interventions: 1 on 1 counseling sessions, Psychoeducation, Medication administration, Evaluate responses to treatment, Monitor vital signs and CBGs as ordered, Perform/monitor CIWA, COWS, AIMS and Fall Risk screenings as ordered, Perform wound care treatments as ordered.  Evaluation of Outcomes: Adequate for Discharge   LCSW Treatment Plan for Primary Diagnosis: MDD (major depressive disorder), recurrent severe, without psychosis ([place]) Long Term Goal(s): Safe transition to appropriate next level of care at discharge, Engage patient in therapeutic group addressing interpersonal concerns.  Short Term Goals: Engage patient in aftercare planning with referrals and resources, Increase social support and Increase skills for wellness and recovery  Therapeutic Interventions: Assess for all discharge needs, 1 to 1 time with Social worker, Explore available resources and support systems, Assess for adequacy in community support network, Educate family  and significant other(s) on suicide prevention, Complete Psychosocial Assessment, Interpersonal group therapy.  Evaluation of Outcomes: Adequate for Discharge   Progress in Treatment: Attending groups: No. Participating in groups: No. Taking medication as prescribed: Yes. Toleration medication: Yes. Family/Significant other contact made: No, will contact:  when given permission Patient understands diagnosis: Yes. Discussing patient identified problems/goals with staff: Yes. Medical problems stabilized or resolved: Yes. Denies suicidal/homicidal ideation: Yes. Issues/concerns per patient self-inventory: No Other: none  New problem(s) identified: No, Describe:  none  New Short Term/Long Term Goal(s): medication stabilization, elimination of SI thoughts, development of comprehensive mental wellness plan.    Patient Goals:  "get back on my medicine, treatment for alcohol, finding stability"  Discharge Plan or Barriers: Patient discharged to [facility] and plans to follow up with [facility] for medication management and therapy services.   Reason for Continuation of Hospitalization: None   Estimated Length of Stay: Discharge 05/15/18    Attendees: Patient: Katrina Kelley 05/15/2018   Physician: Dr. Parke Poisson, MD; Dr. Myles Lipps, MD 05/15/2018   Nursing: Sena Hitch, RN; Yetta Flock, RN 05/15/2018   RN Care Manager:X 05/15/2018   Social Worker: Lurline Idol, LCSW; Radonna Ricker, [place] 05/15/2018   Recreational Therapist: X 05/15/2018   Other: X 05/15/2018   Other: X 05/15/2018   Other:X 05/15/2018     Scribe for Treatment Team: Marylee Floras, [place] 05/15/2018 10:54 AM

## 2018-05-15 NOTE — [facility] Suicide Risk Assessment (Signed)
[facility] Discharge Suicide Risk Assessment   Principal Problem: <principal problem not specified> Discharge Diagnoses:  Patient Active Problem List   Diagnosis Date Noted  . MDD (major depressive disorder), recurrent severe, without psychosis ([place]) [F33.2] 05/09/2018  . Cocaine abuse with cocaine-induced mood disorder ([place]) [F14.14] 01/29/2018  . 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 Alcohol use disorder, moderate, dependence ([place]) [F10.20] 03/17/2016  . Cocaine use disorder, severe, dependence ([place]) [F14.20] 03/17/2016  . Substance induced mood disorder ([place]) [F19.94] 03/17/2016  . Chronic foot pain [M79.673, G89.29] 02/07/2016  . Osteoarthrosis of ankle and foot [M19.079] 02/07/2016  . Appendicitis [K37] 03/12/2014  . Major depression, recurrent, chronic ([place]) [F33.9] 08/20/2012    Total Time spent with patient: 15 minutes  Musculoskeletal: Strength & Muscle Tone: within normal limits Gait & Station: normal Patient leans: N/A  Psychiatric Specialty Exam: Review of Systems  All other systems reviewed and are negative.   Blood pressure 129/90, pulse 77, temperature 98.8 F (37.1 C), temperature source Oral, resp. rate 16, height 5\' 8"  (1.727 m), weight 84.4 kg (186 lb), last menstrual period 03/19/1985, SpO2 98 %.Body mass index is 28.28 kg/m.  General Appearance: Casual  Eye Contact::  Good  Speech:  Clear and Coherent409  Volume:  Normal  Mood:  Euthymic  Affect:  Congruent  Thought Process:  Coherent  Orientation:  Full (Time, Place, and Person)  Thought Content:  Logical  Suicidal Thoughts:  No  Homicidal Thoughts:  No  Memory:  Immediate;   Fair Recent;   Fair Remote;   Fair  Judgement:  Intact  Insight:  Fair  Psychomotor Activity:  Normal  Concentration:  Fair  Recall:  [employer] of Knowledge:Fair  Language: Fair  Akathisia:  Negative  Handed:  Right  AIMS (if indicated):     Assets:  Desire for  Improvement Resilience  Sleep:  Number of Hours: 6.75  Cognition: WNL  ADL's:  Intact   Mental Status Per Nursing Assessment::   On Admission:  Suicidal ideation indicated by patient, Self-harm thoughts, Intention to act on suicide plan  Demographic Factors:  Divorced or widowed, Low socioeconomic status and Unemployed  Loss Factors: NA  Historical Factors: Impulsivity  Risk Reduction Factors:   Positive coping skills or problem solving skills  Continued Clinical Symptoms:  Depression:   Impulsivity  Cognitive Features That Contribute To Risk:  None    Suicide Risk:  Minimal: No identifiable suicidal ideation.  Patients presenting with no risk factors but with morbid ruminations; may be classified as minimal risk based on the severity of the depressive symptoms  Follow-up [place]. Go to.   Specialty:  Behavioral Health Why:  For medication management and therapy services please follow up. Walk-in hours are Monday- Friday from 8:00am- 5:00pm. Please be sure to bring your photo ID, any insurance information and any discharge paperwork from this hospitalization.  Contact information: [place] [phone]          Plan Of Care/Follow-up recommendations:  Activity:  ad lib  Sharma Covert, MD 05/15/2018, 7:56 AM

## 2018-05-15 NOTE — Plan of Care (Signed)
  Problem: Education: Goal: Knowledge of [place] [facility] Education information/materials will improve 05/15/2018 0909 by Megan Mans, RN Outcome: Adequate for Discharge 05/15/2018 0845 by Megan Mans, RN Outcome: Progressing   Problem: Education: Goal: Emotional status will improve 05/15/2018 0909 by Megan Mans, RN Outcome: Adequate for Discharge 05/15/2018 0845 by Megan Mans, RN Outcome: Progressing   Problem: Education: Goal: Mental status will improve 05/15/2018 0909 by Megan Mans, RN Outcome: Adequate for Discharge 05/15/2018 0845 by Megan Mans, RN Outcome: Progressing   Problem: Education: Goal: Verbalization of understanding the information provided will improve 05/15/2018 0909 by Megan Mans, RN Outcome: Adequate for Discharge 05/15/2018 0845 by Megan Mans, RN Outcome: Progressing
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
## 2018-05-15 NOTE — Progress Notes (Signed)
  [facility] Adult Case Management Discharge Plan :  Will you be returning to the same living situation after discharge:  No. Patient is discharging to the [facility] at discharge.  At discharge, do you have transportation home?: Yes,  patient reports a close friend will pick her up at discharge. Bus passes were provided for her trip to [facility] Do you have the ability to pay for your medications: No.  Release of information consent forms completed and in the chart;  Patient's signature needed at discharge.  Patient to Follow up at: [place]. Go to.   Specialty:  Behavioral Health Why:  For medication management and therapy services please follow up. Walk-in hours are Monday- Friday from 8:00am- 5:00pm. Please be sure to bring your photo ID, any insurance information and any discharge paperwork from this hospitalization.  Contact information: [place] [phone]          Next level of care provider has access to [place] and Suicide Prevention discussed: Yes,  with the patient  Have you used any form of tobacco in the last 30 days? (Cigarettes, Smokeless Tobacco, Cigars, and/or Pipes): Yes  Has patient been referred to the Quitline?: Patient refused referral  Patient has been referred for addiction treatment: Pt. refused referral  Marylee Floras, [place] 05/15/2018, 10:21 AM

## 2018-05-15 NOTE — Progress Notes (Signed)
Recreation Therapy Notes  Date: 7.31.19 Time: 0930 Location: [address] Dayroom  Group Topic: Stress Management  Goal Area(s) Addresses:  Patient will verbalize importance of using healthy stress management.  Patient will identify positive emotions associated with healthy stress management.   Behavioral Response: Engaged  Intervention: Stress Management  Activity :  Guided Imagery.  LRT introduced the stress management technique of guided imagery.  LRT read a script about peaceful waves.  Patients were to follow along as LRT read script to relax and envision being on the beach.  Education:  Stress Management, Discharge Planning.   Education Outcome: Acknowledges edcuation/In group clarification offered/Needs additional education  Clinical Observations/Feedback:  Pt attended and participated in group.      Victorino Sparrow, LRT/CTRS         Ria Comment, Briseyda Fehr A 05/15/2018 10:36 AM
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
## 2018-11-08 ENCOUNTER — Emergency Department (HOSPITAL_COMMUNITY)
Admission: EM | Admit: 2018-11-08 | Discharge: 2018-11-08 | Disposition: A | Discharge status: Home / Self Care | Payer: Medicare Other | Attending: Emergency Medicine | Admitting: Emergency Medicine

## 2018-11-08 DIAGNOSIS — F17210 Nicotine dependence, cigarettes, uncomplicated: Secondary | ICD-10-CM | POA: Insufficient documentation

## 2018-11-08 DIAGNOSIS — Z4800 Encounter for change or removal of nonsurgical wound dressing: Secondary | ICD-10-CM | POA: Insufficient documentation

## 2018-11-08 DIAGNOSIS — Z79899 Other long term (current) drug therapy: Secondary | ICD-10-CM | POA: Diagnosis not present

## 2018-11-08 DIAGNOSIS — Z91040 Latex allergy status: Secondary | ICD-10-CM | POA: Insufficient documentation

## 2018-11-08 DIAGNOSIS — Z5189 Encounter for other specified aftercare: Secondary | ICD-10-CM

## 2018-11-08 NOTE — ED Triage Notes (Signed)
Pt to ER for recheck of chronic wound to left lateral thigh/hip. States is new to the area and was "told to come here to get put into the system."
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
## 2018-11-08 NOTE — ED Notes (Signed)
Patient verbalizes understanding of discharge instructions. Opportunity for questioning and answers were provided. Armband removed by staff, pt discharged from ED.  Pt refused discharge vitals.

## 2018-11-10 NOTE — Care Management (Signed)
ED CM met with patient at bedside to discuss options for transitional care and provide assistance. Patient reports living in transitional housing in a motel in [place]. Discussed possible HH service for wound care.  CM explained that I will fax referral in to [facility] to inquire on staffing for [facility] needs and will not find out until possible Monday patient verbalized understanding. CM also made patient aware that I will also send a referral to the [place] at [facility] and she can contact them Monday 1/27 to schedule an appointment for follow up. Patient is agreeable to plan, CM verified patient's contact information and will follow up with patient on Monday.

## 2018-11-12 ENCOUNTER — Encounter (HOSPITAL_COMMUNITY): Payer: Self-pay

## 2018-11-12 ENCOUNTER — Emergency Department (HOSPITAL_COMMUNITY): Payer: Medicare Other

## 2018-11-12 ENCOUNTER — Other Ambulatory Visit: Payer: Self-pay

## 2018-11-12 ENCOUNTER — Emergency Department (HOSPITAL_COMMUNITY)
Admission: EM | Admit: 2018-11-12 | Discharge: 2018-11-12 | Payer: Medicare Other | Attending: Emergency Medicine | Admitting: Emergency Medicine

## 2018-11-12 DIAGNOSIS — R45851 Suicidal ideations: Secondary | ICD-10-CM | POA: Diagnosis not present

## 2018-11-12 DIAGNOSIS — Y998 Other external cause status: Secondary | ICD-10-CM | POA: Diagnosis not present

## 2018-11-12 DIAGNOSIS — X58XXXA Exposure to other specified factors, initial encounter: Secondary | ICD-10-CM | POA: Diagnosis not present

## 2018-11-12 DIAGNOSIS — F419 Anxiety disorder, unspecified: Secondary | ICD-10-CM | POA: Diagnosis not present

## 2018-11-12 DIAGNOSIS — S81802A Unspecified open wound, left lower leg, initial encounter: Secondary | ICD-10-CM | POA: Insufficient documentation

## 2018-11-12 DIAGNOSIS — F319 Bipolar disorder, unspecified: Secondary | ICD-10-CM | POA: Insufficient documentation

## 2018-11-12 DIAGNOSIS — Z79899 Other long term (current) drug therapy: Secondary | ICD-10-CM | POA: Diagnosis not present

## 2018-11-12 DIAGNOSIS — Y929 Unspecified place or not applicable: Secondary | ICD-10-CM | POA: Diagnosis not present

## 2018-11-12 DIAGNOSIS — Z91040 Latex allergy status: Secondary | ICD-10-CM | POA: Diagnosis not present

## 2018-11-12 DIAGNOSIS — F250 Schizoaffective disorder, bipolar type: Secondary | ICD-10-CM | POA: Diagnosis not present

## 2018-11-12 DIAGNOSIS — Y9389 Activity, other specified: Secondary | ICD-10-CM | POA: Insufficient documentation

## 2018-11-12 DIAGNOSIS — S8992XA Unspecified injury of left lower leg, initial encounter: Secondary | ICD-10-CM | POA: Diagnosis present

## 2018-11-12 DIAGNOSIS — F29 Unspecified psychosis not due to a substance or known physiological condition: Secondary | ICD-10-CM

## 2018-11-12 DIAGNOSIS — F17210 Nicotine dependence, cigarettes, uncomplicated: Secondary | ICD-10-CM | POA: Diagnosis not present

## 2018-11-12 DIAGNOSIS — F1410 Cocaine abuse, uncomplicated: Secondary | ICD-10-CM | POA: Diagnosis not present

## 2018-11-12 LAB — COMPREHENSIVE METABOLIC PANEL
ALBUMIN: 3.4 g/dL — AB (ref 3.5–5.0)
ALK PHOS: 71 U/L (ref 38–126)
ALT: 19 U/L (ref 0–44)
AST: 32 U/L (ref 15–41)
Anion gap: 9 (ref 5–15)
BILIRUBIN TOTAL: 0.6 mg/dL (ref 0.3–1.2)
BUN: 9 mg/dL (ref 8–23)
CO2: 25 mmol/L (ref 22–32)
CREATININE: 0.59 mg/dL (ref 0.44–1.00)
Calcium: 9.1 mg/dL (ref 8.9–10.3)
Chloride: 107 mmol/L (ref 98–111)
GFR calc Af Amer: >60 mL/min (ref >60)
Glucose, Bld: 87 mg/dL (ref 70–99)
POTASSIUM: 3.3 mmol/L — AB (ref 3.5–5.1)
Sodium: 141 mmol/L (ref 135–145)
TOTAL PROTEIN: 8.7 g/dL — AB (ref 6.5–8.1)

## 2018-11-12 LAB — URINALYSIS, ROUTINE W REFLEX MICROSCOPIC
Bilirubin Urine: NEGATIVE
Glucose, UA: NEGATIVE mg/dL
Hgb urine dipstick: NEGATIVE
Ketones, ur: NEGATIVE mg/dL
Leukocytes, UA: NEGATIVE
Nitrite: NEGATIVE
Protein, ur: NEGATIVE mg/dL
Specific Gravity, Urine: 1.02 (ref 1.005–1.030)
pH: 6 (ref 5.0–8.0)

## 2018-11-12 LAB — RAPID URINE DRUG SCREEN, HOSP PERFORMED
Amphetamines: NOT DETECTED
BARBITURATES: NOT DETECTED
Benzodiazepines: NOT DETECTED
COCAINE: POSITIVE — AB
Opiates: NOT DETECTED
Tetrahydrocannabinol: NOT DETECTED

## 2018-11-12 LAB — ACETAMINOPHEN LEVEL: Acetaminophen (Tylenol), Serum: <10 ug/mL — ABNORMAL LOW (ref 10–30)

## 2018-11-12 LAB — CBC
HEMATOCRIT: 43.9 % (ref 36.0–46.0)
Hemoglobin: 14 g/dL (ref 12.0–15.0)
MCH: 28.2 pg (ref 26.0–34.0)
MCHC: 31.9 g/dL (ref 30.0–36.0)
MCV: 88.5 fL (ref 80.0–100.0)
PLATELETS: 213 10*3/uL (ref 150–400)
RBC: 4.96 MIL/uL (ref 3.87–5.11)
RDW: 14.8 % (ref 11.5–15.5)
WBC: 5.5 10*3/uL (ref 4.0–10.5)
nRBC: 0 % (ref 0.0–0.2)

## 2018-11-12 LAB — ETHANOL

## 2018-11-12 LAB — SALICYLATE LEVEL: Salicylate Lvl: <7 mg/dL (ref 2.8–30.0)

## 2018-11-12 MED ORDER — CARBAMAZEPINE 200 MG PO TABS
200.0000 mg | ORAL_TABLET | Freq: Two times a day (BID) | ORAL | Status: DC
  Administered 2018-11-12: 200 mg via ORAL
  Filled 2018-11-12: qty 1

## 2018-11-12 MED ORDER — QUETIAPINE FUMARATE 100 MG PO TABS: 100.0000 mg | ORAL_TABLET | Freq: Every day | ORAL | Status: DC

## 2018-11-12 MED ORDER — SERTRALINE HCL 50 MG PO TABS
100.0000 mg | ORAL_TABLET | Freq: Every day | ORAL | Status: DC
  Administered 2018-11-12: 100 mg via ORAL
  Filled 2018-11-12: qty 2
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
## 2018-11-12 NOTE — Progress Notes (Addendum)
Patient has been accepted to [facility] for today 1/28.   Accepting physician is: Dr. Ricard Dillon Number for report: [phone] Rm: 40-A  CSW notified EDP for EMTALA.   Kingsley Spittle, LCSW Clinical Social Worker  [facility]  [phone]

## 2018-11-12 NOTE — ED Notes (Signed)
Report given to [facility]. Report was given to [facility].
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
## 2019-11-21 ENCOUNTER — Other Ambulatory Visit: Payer: Self-pay

## 2019-11-21 ENCOUNTER — Encounter (HOSPITAL_COMMUNITY): Payer: Self-pay

## 2019-11-21 ENCOUNTER — Emergency Department (HOSPITAL_COMMUNITY)
Admission: EM | Admit: 2019-11-21 | Discharge: 2019-11-21 | Disposition: A | Discharge status: Home / Self Care | Payer: Medicare PPO | Attending: Emergency Medicine | Admitting: Emergency Medicine

## 2019-11-21 ENCOUNTER — Emergency Department (HOSPITAL_COMMUNITY): Payer: Medicare PPO

## 2019-11-21 DIAGNOSIS — Z91040 Latex allergy status: Secondary | ICD-10-CM | POA: Insufficient documentation

## 2019-11-21 DIAGNOSIS — R05 Cough: Secondary | ICD-10-CM | POA: Diagnosis not present

## 2019-11-21 DIAGNOSIS — F250 Schizoaffective disorder, bipolar type: Secondary | ICD-10-CM | POA: Diagnosis not present

## 2019-11-21 DIAGNOSIS — F1410 Cocaine abuse, uncomplicated: Secondary | ICD-10-CM | POA: Diagnosis not present

## 2019-11-21 DIAGNOSIS — J189 Pneumonia, unspecified organism: Secondary | ICD-10-CM

## 2019-11-21 DIAGNOSIS — Z20822 Contact with and (suspected) exposure to covid-19: Secondary | ICD-10-CM | POA: Insufficient documentation

## 2019-11-21 DIAGNOSIS — F17210 Nicotine dependence, cigarettes, uncomplicated: Secondary | ICD-10-CM | POA: Insufficient documentation

## 2019-11-21 DIAGNOSIS — Z79899 Other long term (current) drug therapy: Secondary | ICD-10-CM | POA: Insufficient documentation

## 2019-11-21 DIAGNOSIS — R0789 Other chest pain: Secondary | ICD-10-CM

## 2019-11-21 DIAGNOSIS — R0609 Other forms of dyspnea: Secondary | ICD-10-CM | POA: Diagnosis not present

## 2019-11-21 LAB — BASIC METABOLIC PANEL
Anion gap: 15 (ref 5–15)
BUN: 12 mg/dL (ref 8–23)
CO2: 18 mmol/L — ABNORMAL LOW (ref 22–32)
Calcium: 8.4 mg/dL — ABNORMAL LOW (ref 8.9–10.3)
Chloride: 103 mmol/L (ref 98–111)
Creatinine, Ser: 0.72 mg/dL (ref 0.44–1.00)
GFR calc Af Amer: >60 mL/min (ref >60)
GFR calc non Af Amer: >60 mL/min (ref >60)
Glucose, Bld: 77 mg/dL (ref 70–99)
Potassium: 5.4 mmol/L — ABNORMAL HIGH (ref 3.5–5.1)
Sodium: 136 mmol/L (ref 135–145)

## 2019-11-21 LAB — RAPID URINE DRUG SCREEN, HOSP PERFORMED
Amphetamines: NOT DETECTED
Barbiturates: NOT DETECTED
Benzodiazepines: NOT DETECTED
Cocaine: POSITIVE — AB
Opiates: NOT DETECTED
Tetrahydrocannabinol: NOT DETECTED

## 2019-11-21 LAB — CBC
HCT: 44 % (ref 36.0–46.0)
Hemoglobin: 14.9 g/dL (ref 12.0–15.0)
MCH: 30.8 pg (ref 26.0–34.0)
MCHC: 33.9 g/dL (ref 30.0–36.0)
MCV: 90.9 fL (ref 80.0–100.0)
Platelets: 185 10*3/uL (ref 150–400)
RBC: 4.84 MIL/uL (ref 3.87–5.11)
RDW: 14.3 % (ref 11.5–15.5)
WBC: 6.2 10*3/uL (ref 4.0–10.5)
nRBC: 0 % (ref 0.0–0.2)

## 2019-11-21 LAB — POC SARS CORONAVIRUS 2 AG -  ED: SARS Coronavirus 2 Ag: NEGATIVE

## 2019-11-21 LAB — I-STAT CHEM 8, ED
BUN: 11 mg/dL (ref 8–23)
Calcium, Ion: 0.99 mmol/L — ABNORMAL LOW (ref 1.15–1.40)
Chloride: 105 mmol/L (ref 98–111)
Creatinine, Ser: 0.5 mg/dL (ref 0.44–1.00)
Glucose, Bld: 91 mg/dL (ref 70–99)
HCT: 41 % (ref 36.0–46.0)
Hemoglobin: 13.9 g/dL (ref 12.0–15.0)
Potassium: 4 mmol/L (ref 3.5–5.1)
Sodium: 138 mmol/L (ref 135–145)
TCO2: 19 mmol/L — ABNORMAL LOW (ref 22–32)

## 2019-11-21 LAB — TROPONIN I (HIGH SENSITIVITY)
Troponin I (High Sensitivity): 8 ng/L (ref <18)
Troponin I (High Sensitivity): 10 ng/L (ref <18)

## 2019-11-21 MED ORDER — ALBUTEROL SULFATE HFA 108 (90 BASE) MCG/ACT IN AERS
2.0000 | INHALATION_SPRAY | RESPIRATORY_TRACT | Status: DC | PRN
  Administered 2019-11-21: 2 via RESPIRATORY_TRACT
  Filled 2019-11-21: qty 6.7

## 2019-11-21 MED ORDER — PREDNISONE 10 MG PO TABS: 20.0000 mg | ORAL_TABLET | Freq: Every day | ORAL | 0 refills | Status: AC

## 2019-11-21 MED ORDER — METOPROLOL TARTRATE 5 MG/5ML IV SOLN
5.0000 mg | Freq: Once | INTRAVENOUS | Status: AC
  Administered 2019-11-21: 5 mg via INTRAVENOUS
  Filled 2019-11-21: qty 5

## 2019-11-21 MED ORDER — SODIUM CHLORIDE 0.9 % IV BOLUS
1000.0000 mL | Freq: Once | INTRAVENOUS | Status: AC
  Administered 2019-11-21: 1000 mL via INTRAVENOUS

## 2019-11-21 MED ORDER — PREDNISONE 20 MG PO TABS
60.0000 mg | ORAL_TABLET | Freq: Once | ORAL | Status: AC
  Administered 2019-11-21: 60 mg via ORAL
  Filled 2019-11-21: qty 3

## 2019-11-21 NOTE — ED Provider Notes (Signed)
[facility] EMERGENCY DEPARTMENT Provider Note   CSN: [id number] Arrival date & time: 11/21/19  1045     History No chief complaint on file.   Katrina Kelley is a 64 y.o. female.  HPI 63 year old female history of bipolar disorder, cocaine abuse, presents today complaining of chest pain.  She states that she was out walking, patient is homeless.  She states that she went into the [place] on [address]. because she started having some sharp pain in her chest.  She has some associated cough.  She denies any fever or chills.  She has some mild dyspnea.    Past Medical History:  Diagnosis Date  . Anxiety   . Arthritis   . Bipolar 1 disorder ([place])   . Depression   . Schizoaffective disorder [facility])     Patient Active Problem List   Diagnosis Date Noted  . MDD (major depressive disorder), recurrent severe, without psychosis ([place]) 05/09/2018  . Cocaine abuse with cocaine-induced mood disorder ([employer]) 01/29/2018  . 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[place]) 01/29/2018  . Alcohol use disorder, moderate, dependence ([place]) 03/17/2016  . Cocaine use disorder, severe, dependence ([place]) 03/17/2016  . Substance induced mood disorder ([place]) 03/17/2016  . Chronic foot pain 02/07/2016  . Osteoarthrosis of ankle and foot 02/07/2016  . Appendicitis 03/12/2014  . Major depression, recurrent, chronic ([place]) 08/20/2012    Past Surgical History:  Procedure Laterality Date  . ABDOMINAL HYSTERECTOMY    . APPENDECTOMY    . ELEVATION OF DEPRESSED SKULL FRACTURE    . FOOT ARTHRODESIS     rt and lt  . FRACTURE SURGERY  2005   fx both lower legs   . LAPAROSCOPIC APPENDECTOMY N/A 03/12/2014   Procedure: APPENDECTOMY LAPAROSCOPIC;  Surgeon: [name], MD;  Location: [place];  Service: General;  Laterality: N/A;  . TOOTH EXTRACTION  11/27/2011   Procedure: DENTAL RESTORATION/EXTRACTIONS;  Surgeon: [name], DDS;  Location: [facility];   Service: Oral Surgery;  Laterality: N/A;  just extractions     OB History   No obstetric history on file.     Family History  Problem Relation Age of Onset  . Hyperlipidemia Son     Social History   Tobacco Use  . Smoking status: Current Every Day Smoker    Packs/day: 0.50    Years: 38.00    Pack years: 19.00    Types: Cigarettes    Last attempt to quit: 03/22/2017    Years since quitting: 2.6  . Smokeless tobacco: Never Used  Substance Use Topics  . Alcohol use: Yes    Comment: states 1 beer 2 days ago.  . Drug use: Not Currently    Types: Cocaine    Comment: states last used 1 year ago.    Home Medications Prior to Admission medications   Medication Sig Start Date End Date Taking? Authorizing Provider  acetaminophen (TYLENOL) 325 MG tablet Take 650 mg by mouth 4 (four) times daily.    [provider]  calcium citrate (CALCITRATE - DOSED IN MG ELEMENTAL CALCIUM) 950 MG tablet Take 200 mg of elemental calcium by mouth daily.    [provider]  carbamazepine (TEGRETOL) 200 MG tablet Take 1 tablet (200 mg total) by mouth 2 (two) times daily. Patient not taking: Reported on 11/12/2018 05/15/18   Mordecai Maes, NP  cholecalciferol (VITAMIN D) 25 MCG (1000 UT) tablet Take 1,000 Units by mouth daily.    [provider]  folic acid (FOLVITE) 1 MG tablet Take 1 mg by mouth 2 (two) times daily.    [provider]  iron polysaccharides (NIFEREX) 150 MG capsule Take 150 mg by mouth daily.    [provider]  Multiple Vitamins-Minerals (MULTIVITAMIN ADULT) TABS Take 1 tablet by mouth daily.    [provider]  nitroGLYCERIN (NITROSTAT) 0.4 MG SL tablet Place 1 tablet (0.4 mg total) under the tongue as needed for chest pain. Patient not taking: Reported on 11/12/2018 05/15/18   Mordecai Maes, NP  ondansetron (ZOFRAN) 4 MG tablet Take 4 mg by mouth every 8 (eight) hours as needed for nausea or vomiting.    [provider]    polyethylene glycol (MIRALAX / GLYCOLAX) packet Take 17 g by mouth daily.    [provider]  QUEtiapine (SEROQUEL) 100 MG tablet Take 1 tablet (100 mg total) by mouth at bedtime. Patient taking differently: Take 200 mg by mouth at bedtime.  05/15/18   Mordecai Maes, NP  rivaroxaban (XARELTO) 20 MG TABS tablet Take 20 mg by mouth daily with supper.    [provider]  sertraline (ZOLOFT) 100 MG tablet Take 1 tablet (100 mg total) by mouth daily. Patient not taking: Reported on 11/12/2018 05/16/18   Mordecai Maes, NP  thiamine 100 MG tablet Take 1 tablet (100 mg total) by mouth daily. 05/16/18   Mordecai Maes, NP    Allergies    Demerol, Latex, Penicillins, Strawberry extract, and Vicodin [hydrocodone-acetaminophen]  Review of Systems   Review of Systems  All other systems reviewed and are negative.   Physical Exam Updated Vital Signs BP (!) 155/113   Pulse (!) 116   Resp (!) 29   LMP 03/19/1985   SpO2 93%   Physical Exam Vitals and nursing note reviewed.  Constitutional:      Appearance: Normal appearance. She is obese.  HENT:     Head: Normocephalic.     Right Ear: External ear normal.     Left Ear: External ear normal.     Nose: Nose normal.     Mouth/Throat:     Pharynx: Oropharynx is clear.  Eyes:     Extraocular Movements: Extraocular movements intact.     Pupils: Pupils are equal, round, and reactive to light.  Cardiovascular:     Rate and Rhythm: Regular rhythm. Tachycardia present.  Pulmonary:     Effort: Pulmonary effort is normal.     Breath sounds: Normal breath sounds.  Abdominal:     General: Abdomen is flat.     Palpations: Abdomen is soft.  Musculoskeletal:        General: Normal range of motion.     Cervical back: Normal range of motion.  Skin:    General: Skin is warm.     Capillary Refill: Capillary refill takes less than 2 seconds.  Neurological:     General: No focal deficit present.     Mental Status: She is alert and  oriented to person, place, and time.  Psychiatric:        Mood and Affect: Mood normal.     ED Results / Procedures / Treatments   Labs (all labs ordered are listed, but only abnormal results are displayed) Labs Reviewed  CBC  BASIC METABOLIC PANEL  RAPID URINE DRUG SCREEN, HOSP PERFORMED  POC SARS CORONAVIRUS 2 AG -  ED  TROPONIN I (HIGH SENSITIVITY)    EKG EKG Interpretation  Date/Time:  Friday November 21 2019  11:00:22 EST Ventricular Rate:  109 PR Interval:    QRS Duration: 85 QT Interval:  349 QTC Calculation: 470 R Axis:   53 Text Interpretation: Sinus tachycardia Probable anteroseptal infarct, old Confirmed by Pattricia Boss [phone]) on 11/21/2019 11:33:00 AM   Radiology DG Chest Port 1 View  Result Date: 11/21/2019 CLINICAL DATA:  Cough. EXAM: PORTABLE CHEST 1 VIEW COMPARISON:  11/12/2018. FINDINGS: Mediastinum hilar structures normal. Heart size normal. Diffuse bilateral increase in interstitial prominence noted suggesting pneumonitis. No pleural effusion or pneumothorax. IMPRESSION: Diffuse increase interstitial markings. Findings consistent with pneumonitis. Electronically Signed   By: Marcello Moores  Register   On: 11/21/2019 11:12    Procedures Procedures (including critical care time)  Medications Ordered in ED Medications  sodium chloride 0.9 % bolus 1,000 mL (has no administration in time range)  metoprolol tartrate (LOPRESSOR) injection 5 mg (has no administration in time range)    ED Course  I have reviewed the triage vital signs and the nursing notes.  Pertinent labs & imaging results that were available during my care of the patient were reviewed by me and considered in my medical decision making (see chart for details).    MDM Rules/Calculators/A&P                      64 yo female complaining of diffuse chest pain worsens with movement.  Pneumonitis noted on cxr- patient now admits to smoking cocaine.  Patients symptoms improved with albuterol.  Plan  prednisone and will consult case management for placement  Final Clinical Impression(s) / ED Diagnoses Final diagnoses:  None    Rx / DC Orders ED Discharge Orders    None       Pattricia Boss, MD 11/21/19 [phone]

## 2019-11-21 NOTE — ED Notes (Signed)
Pt verbalized understanding of discharge instructions. Pt was given [facility] phone number, and bus pass. Pt brought to lobby via wheelchair.

## 2019-11-21 NOTE — ED Triage Notes (Addendum)
Pt was at the laundry mat when she began to have CP in her central chest that was an aching sensation with no radiation. She has had a congested/productive cough for roughly a week. EMS reports that she had a 102 oral Temp while in route and she showed some behavioral activity such as anxiousness, jitteriness, and lip smacking, she was given aspirin 324 mg, A/Ox4 & ST on their monitor.

## 2019-11-21 NOTE — Social Work (Signed)
EDCSW met with Pt at bedside and offered substance use resources. Pt denied that use was a problem and stated that she had just left a rehab and "it didn't do any good". Pt reports that problem is homelessness. CSW counseled Pt to seek assistance at [facility] and [facility].  CSW provided substance use resources in written form for Pt to take with her, as well as [place] [employer], socks, a hat, and the number for [facility] crisis line as requested by Pt.

## 2019-11-21 NOTE — Discharge Instructions (Addendum)
Use albuterol 2 puffs every 4 hours Avoid all illicit substances

## 2019-11-23 ENCOUNTER — Other Ambulatory Visit: Payer: Self-pay

## 2019-11-23 ENCOUNTER — Emergency Department (HOSPITAL_COMMUNITY)
Admission: EM | Admit: 2019-11-23 | Discharge: 2019-11-23 | Disposition: A | Discharge status: Other Acute Inpatient Hospital | Payer: Medicare PPO | Attending: Emergency Medicine | Admitting: Emergency Medicine

## 2019-11-23 DIAGNOSIS — F17210 Nicotine dependence, cigarettes, uncomplicated: Secondary | ICD-10-CM | POA: Insufficient documentation

## 2019-11-23 DIAGNOSIS — Z79899 Other long term (current) drug therapy: Secondary | ICD-10-CM | POA: Insufficient documentation

## 2019-11-23 DIAGNOSIS — R45851 Suicidal ideations: Secondary | ICD-10-CM | POA: Insufficient documentation

## 2019-11-23 DIAGNOSIS — F329 Major depressive disorder, single episode, unspecified: Secondary | ICD-10-CM | POA: Insufficient documentation

## 2019-11-23 DIAGNOSIS — Z20822 Contact with and (suspected) exposure to covid-19: Secondary | ICD-10-CM | POA: Diagnosis not present

## 2019-11-23 DIAGNOSIS — F250 Schizoaffective disorder, bipolar type: Secondary | ICD-10-CM | POA: Diagnosis not present

## 2019-11-23 LAB — RAPID URINE DRUG SCREEN, HOSP PERFORMED
Amphetamines: NOT DETECTED
Barbiturates: NOT DETECTED
Benzodiazepines: NOT DETECTED
Cocaine: POSITIVE — AB
Opiates: NOT DETECTED
Tetrahydrocannabinol: POSITIVE — AB

## 2019-11-23 LAB — ACETAMINOPHEN LEVEL: Acetaminophen (Tylenol), Serum: <10 ug/mL — ABNORMAL LOW (ref 10–30)

## 2019-11-23 LAB — BASIC METABOLIC PANEL
Anion gap: 11 (ref 5–15)
BUN: 25 mg/dL — ABNORMAL HIGH (ref 8–23)
CO2: 22 mmol/L (ref 22–32)
Calcium: 8.8 mg/dL — ABNORMAL LOW (ref 8.9–10.3)
Chloride: 108 mmol/L (ref 98–111)
Creatinine, Ser: 0.74 mg/dL (ref 0.44–1.00)
GFR calc Af Amer: >60 mL/min (ref >60)
GFR calc non Af Amer: >60 mL/min (ref >60)
Glucose, Bld: 103 mg/dL — ABNORMAL HIGH (ref 70–99)
Potassium: 3.6 mmol/L (ref 3.5–5.1)
Sodium: 141 mmol/L (ref 135–145)

## 2019-11-23 LAB — CBC WITH DIFFERENTIAL/PLATELET
Abs Immature Granulocytes: 0.04 10*3/uL (ref 0.00–0.07)
Basophils Absolute: 0 10*3/uL (ref 0.0–0.1)
Basophils Relative: 1 %
Eosinophils Absolute: 0 10*3/uL (ref 0.0–0.5)
Eosinophils Relative: 1 %
HCT: 43.2 % (ref 36.0–46.0)
Hemoglobin: 14.1 g/dL (ref 12.0–15.0)
Immature Granulocytes: 1 %
Lymphocytes Relative: 26 %
Lymphs Abs: 1.3 10*3/uL (ref 0.7–4.0)
MCH: 30.2 pg (ref 26.0–34.0)
MCHC: 32.6 g/dL (ref 30.0–36.0)
MCV: 92.5 fL (ref 80.0–100.0)
Monocytes Absolute: 0.4 10*3/uL (ref 0.1–1.0)
Monocytes Relative: 7 %
Neutro Abs: 3.2 10*3/uL (ref 1.7–7.7)
Neutrophils Relative %: 64 %
Platelets: 197 10*3/uL (ref 150–400)
RBC: 4.67 MIL/uL (ref 3.87–5.11)
RDW: 14.6 % (ref 11.5–15.5)
WBC: 4.9 10*3/uL (ref 4.0–10.5)
nRBC: 0 % (ref 0.0–0.2)

## 2019-11-23 LAB — RESPIRATORY PANEL BY RT PCR (FLU A&B, COVID)
Influenza A by PCR: NEGATIVE
Influenza B by PCR: NEGATIVE
SARS Coronavirus 2 by RT PCR: NEGATIVE

## 2019-11-23 LAB — SALICYLATE LEVEL: Salicylate Lvl: <7 mg/dL — ABNORMAL LOW (ref 7.0–30.0)

## 2019-11-23 LAB — ETHANOL: Alcohol, Ethyl (B): <10 mg/dL (ref <10)

## 2019-11-23 NOTE — ED Provider Notes (Signed)
I was informed by the nurse that the patient has been accepted to [facility] inpatient psych facility by Dr. Laurin Coder.   Katrina Rasmussen, MD 11/24/19 [phone]

## 2019-11-23 NOTE — Progress Notes (Signed)
Pt accepted to [facility] Dr. Eloise Levels is the accepting provider.  Call report to [phone] Nena Jordan, RN @ [facility] ED notified.   Pt is Voluntary.  Pt may be transported by [facility] Pt scheduled to arrive as soon as negative COVID results have been faxed to [phone].   Darletta Moll MSW, [place] Worker Disposition  [facility] Ph: [phone] Fax: [phone]  11/23/2019 4:12 PM
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
## 2019-11-23 NOTE — Progress Notes (Signed)
Patient meets criteria for inpatient treatment per Ricky Ala, NP. No appropriate beds at [facility] currently. CSW faxed referrals to the following facilities for review:  [facility]   [facility]   [facility]      TTS will continue to seek bed placement.     Darletta Moll MSW, [place] Worker Disposition  [facility] Ph: [phone] Fax: [phone] 11/23/2019 3:27 PM

## 2019-11-23 NOTE — ED Triage Notes (Signed)
Patient called Katrina Kelley stating she had taken an overdose of tylenol but threw up.  She states she is still feeling suicidal and needs to be seen.

## 2019-11-23 NOTE — ED Provider Notes (Signed)
[facility] DEPT Provider Note   CSN: [id number] Arrival date & time: 11/23/19  1128     History Chief Complaint  Patient presents with  . Suicidal    Katrina Kelley is a 64 y.o. female.  64 year old female with prior medical history as detailed below presents for evaluation of reported suicidal ideation.  Patient arrives on a voluntary basis with [employer] escort.  Patient reports thoughts of self-harm.  She had a plan to overdose.  She reports prior episodes of similar feelings in the past.  She denies other specific acute complaint.  The history is provided by the patient and medical records.  Mental Health Problem Presenting symptoms: suicidal thoughts   Degree of incapacity (severity):  Mild Onset quality:  Unable to specify Timing:  Constant Progression:  Worsening Chronicity:  New Relieved by:  Nothing Worsened by:  Nothing      Past Medical History:  Diagnosis Date  . Anxiety   . Arthritis   . Bipolar 1 disorder ([place])   . Depression   . Schizoaffective disorder [facility])     Patient Active Problem List   Diagnosis Date Noted  . MDD (major depressive disorder), recurrent severe, without psychosis ([place]) 05/09/2018  . Cocaine abuse with cocaine-induced mood disorder ([place]) 01/29/2018  . 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 Alcohol use disorder, moderate, dependence ([place]) 03/17/2016  . Cocaine use disorder, severe, dependence ([place]) 03/17/2016  . Substance induced mood disorder ([place]) 03/17/2016  . Chronic foot pain 02/07/2016  . Osteoarthrosis of ankle and foot 02/07/2016  . Appendicitis 03/12/2014  . Major depression, recurrent, chronic ([place]) 08/20/2012    Past Surgical History:  Procedure Laterality Date  . ABDOMINAL HYSTERECTOMY    . APPENDECTOMY    . ELEVATION OF DEPRESSED SKULL FRACTURE    . FOOT ARTHRODESIS     rt and lt  . FRACTURE SURGERY  2005   fx both lower legs   . LAPAROSCOPIC  APPENDECTOMY N/A 03/12/2014   Procedure: APPENDECTOMY LAPAROSCOPIC;  Surgeon: [name], MD;  Location: [place];  Service: General;  Laterality: N/A;  . TOOTH EXTRACTION  11/27/2011   Procedure: DENTAL RESTORATION/EXTRACTIONS;  Surgeon: [name], DDS;  Location: [place];  Service: Oral Surgery;  Laterality: N/A;  just extractions     OB History   No obstetric history on file.     Family History  Problem Relation Age of Onset  . Hyperlipidemia Son     Social History   Tobacco Use  . Smoking status: Current Every Day Smoker    Packs/day: 0.50    Years: 38.00    Pack years: 19.00    Types: Cigarettes    Last attempt to quit: 03/22/2017    Years since quitting: 2.6  . Smokeless tobacco: Never Used  Substance Use Topics  . Alcohol use: Yes    Comment: states 1 beer 2 days ago.  . Drug use: Not Currently    Types: Cocaine    Comment: states last used 1 year ago.    Home Medications Prior to Admission medications   Medication Sig Start Date End Date Taking? Authorizing Provider  acetaminophen (TYLENOL) 325 MG tablet Take 650 mg by mouth 4 (four) times daily.    [provider]  calcium citrate (CALCITRATE - DOSED IN MG ELEMENTAL CALCIUM) 950 MG tablet Take 200 mg of elemental calcium by mouth daily.    [provider]  carbamazepine (TEGRETOL) 200  MG tablet Take 1 tablet (200 mg total) by mouth 2 (two) times daily. 05/15/18   Mordecai Maes, NP  cholecalciferol (VITAMIN D) 25 MCG (1000 UT) tablet Take 1,000 Units by mouth daily.    [provider]  folic acid (FOLVITE) 1 MG tablet Take 1 mg by mouth 2 (two) times daily.    [provider]  iron polysaccharides (NIFEREX) 150 MG capsule Take 150 mg by mouth daily.    [provider]  Multiple Vitamins-Minerals (MULTIVITAMIN ADULT) TABS Take 1 tablet by mouth daily.    [provider]  nitroGLYCERIN (NITROSTAT) 0.4 MG SL tablet Place 1 tablet (0.4 mg  total) under the tongue as needed for chest pain. 05/15/18   Mordecai Maes, NP  ondansetron (ZOFRAN) 4 MG tablet Take 4 mg by mouth every 8 (eight) hours as needed for nausea or vomiting.    [provider]  polyethylene glycol (MIRALAX / GLYCOLAX) packet Take 17 g by mouth daily.    [provider]  predniSONE (DELTASONE) 10 MG tablet Take 2 tablets (20 mg total) by mouth daily. 11/21/19   Pattricia Boss, MD  QUEtiapine (SEROQUEL) 100 MG tablet Take 1 tablet (100 mg total) by mouth at bedtime. Patient taking differently: Take 200 mg by mouth at bedtime.  05/15/18   Mordecai Maes, NP  rivaroxaban (XARELTO) 20 MG TABS tablet Take 20 mg by mouth daily with supper.    [provider]  sertraline (ZOLOFT) 100 MG tablet Take 1 tablet (100 mg total) by mouth daily. Patient not taking: Reported on 11/12/2018 05/16/18   Mordecai Maes, NP  thiamine 100 MG tablet Take 1 tablet (100 mg total) by mouth daily. 05/16/18   Mordecai Maes, NP    Allergies    Demerol, Latex, Penicillins, Strawberry extract, and Vicodin [hydrocodone-acetaminophen]  Review of Systems   Review of Systems  Psychiatric/Behavioral: Positive for suicidal ideas.  All other systems reviewed and are negative.   Physical Exam Updated Vital Signs LMP 03/19/1985   Physical Exam Vitals and nursing note reviewed.  Constitutional:      General: She is not in acute distress.    Appearance: She is well-developed.  HENT:     Head: Normocephalic and atraumatic.  Eyes:     Conjunctiva/sclera: Conjunctivae normal.     Pupils: Pupils are equal, round, and reactive to light.  Cardiovascular:     Rate and Rhythm: Normal rate and regular rhythm.     Heart sounds: Normal heart sounds.  Pulmonary:     Effort: Pulmonary effort is normal. No respiratory distress.     Breath sounds: Normal breath sounds.  Abdominal:     General: There is no distension.     Palpations: Abdomen is soft.     Tenderness: There is  no abdominal tenderness.  Musculoskeletal:        General: No deformity. Normal range of motion.     Cervical back: Normal range of motion and neck supple.  Skin:    General: Skin is warm and dry.  Neurological:     General: No focal deficit present.     Mental Status: She is alert and oriented to person, place, and time. Mental status is at baseline.  Psychiatric:     Comments: Endorses suicidal ideation with plan to overdose     ED Results / Procedures / Treatments   Labs (all labs ordered are listed, but only abnormal results are displayed) Labs Reviewed  BASIC METABOLIC PANEL - Abnormal; Notable  for the following components:      Result Value   Glucose, Bld 103 (*)    BUN 25 (*)    Calcium 8.8 (*)    All other components within normal limits  ACETAMINOPHEN LEVEL - Abnormal; Notable for the following components:   Acetaminophen (Tylenol), Serum <10 (*)    All other components within normal limits  SALICYLATE LEVEL - Abnormal; Notable for the following components:   Salicylate Lvl [id number] (*)    All other components within normal limits  ETHANOL  CBC WITH DIFFERENTIAL/PLATELET  RAPID URINE DRUG SCREEN, HOSP PERFORMED    EKG None  Radiology No results found.  Procedures Procedures (including critical care time)  Medications Ordered in ED Medications - No data to display  ED Course  I have reviewed the triage vital signs and the nursing notes.  Pertinent labs & imaging results that were available during my care of the patient were reviewed by me and considered in my medical decision making (see chart for details).    MDM Rules/Calculators/A&P                      MDM  Screen complete  Katrina Kelley was evaluated in Emergency Department on 11/23/2019 for the symptoms described in the history of present illness. She was evaluated in the context of the global COVID-19 pandemic, which necessitated consideration that the patient might be at risk for infection  with the SARS-CoV-2 virus that causes COVID-19. Institutional protocols and algorithms that pertain to the evaluation of patients at risk for COVID-19 are in a state of rapid change based on information released by regulatory bodies including the CDC and federal and state organizations. These policies and algorithms were followed during the patient's care in the ED.   Patient is reporting suicidal ideation.  She does not have evidence of active medical problems.  She is medically clear at this time for further psychiatric assessment.  Final disposition dependent upon psychiatric assessment and plan of care.   Final Clinical Impression(s) / ED Diagnoses Final diagnoses:  Suicidal ideation    Rx / DC Orders ED Discharge Orders    None       Valarie Merino, MD 11/23/19 1414

## 2019-11-23 NOTE — BHH Counselor (Signed)
  [facility] ASSESSMENT DISPOSITION  [facility] discussed case with [place] Provider, Ricky Ala, NP who recommends inpatient placement.  TTS will look for inpatient placement.  Haldon Carley L. [place], [place], [facility], [facility] Therapeutic Triage Specialist  [phone]

## 2019-11-23 NOTE — ED Notes (Signed)
Pt discharged safely with [facility].  Patient was asked before hand if she was willing to go to [facility], she stated she was willing.  She was irritable and unhappy at discharge.  There was no explanation as to why she was unhappy.  All belongings were returned to patient.

## 2021-02-19 ENCOUNTER — Encounter (HOSPITAL_COMMUNITY): Payer: Self-pay | Admitting: Emergency Medicine

## 2021-02-19 ENCOUNTER — Other Ambulatory Visit: Payer: Self-pay

## 2021-02-19 ENCOUNTER — Emergency Department (HOSPITAL_COMMUNITY)
Admission: EM | Admit: 2021-02-19 | Discharge: 2021-02-19 | Disposition: A | Discharge status: Home / Self Care | Payer: Medicare Other | Attending: Emergency Medicine | Admitting: Emergency Medicine

## 2021-02-19 ENCOUNTER — Emergency Department (HOSPITAL_COMMUNITY): Payer: Medicare Other

## 2021-02-19 DIAGNOSIS — R059 Cough, unspecified: Secondary | ICD-10-CM | POA: Diagnosis present

## 2021-02-19 DIAGNOSIS — R0602 Shortness of breath: Secondary | ICD-10-CM | POA: Insufficient documentation

## 2021-02-19 DIAGNOSIS — F17210 Nicotine dependence, cigarettes, uncomplicated: Secondary | ICD-10-CM | POA: Insufficient documentation

## 2021-02-19 DIAGNOSIS — Z91040 Latex allergy status: Secondary | ICD-10-CM | POA: Insufficient documentation

## 2021-02-19 DIAGNOSIS — R0789 Other chest pain: Secondary | ICD-10-CM | POA: Insufficient documentation

## 2021-02-19 DIAGNOSIS — J069 Acute upper respiratory infection, unspecified: Secondary | ICD-10-CM | POA: Diagnosis not present

## 2021-02-19 HISTORY — DX: Cocaine abuse with cocaine-induced psychotic disorder, unspecified: F14.159

## 2021-02-19 LAB — CBC
HCT: 43.3 % (ref 36.0–46.0)
Hemoglobin: 14.3 g/dL (ref 12.0–15.0)
MCH: 31.6 pg (ref 26.0–34.0)
MCHC: 33 g/dL (ref 30.0–36.0)
MCV: 95.8 fL (ref 80.0–100.0)
Platelets: 152 10*3/uL (ref 150–400)
RBC: 4.52 MIL/uL (ref 3.87–5.11)
RDW: 13.2 % (ref 11.5–15.5)
WBC: 3.6 10*3/uL — ABNORMAL LOW (ref 4.0–10.5)
nRBC: 0 % (ref 0.0–0.2)

## 2021-02-19 LAB — BASIC METABOLIC PANEL
Anion gap: 6 (ref 5–15)
BUN: 9 mg/dL (ref 8–23)
CO2: 27 mmol/L (ref 22–32)
Calcium: 8.5 mg/dL — ABNORMAL LOW (ref 8.9–10.3)
Chloride: 106 mmol/L (ref 98–111)
Creatinine, Ser: 0.75 mg/dL (ref 0.44–1.00)
GFR, Estimated: >60 mL/min (ref >60)
Glucose, Bld: 134 mg/dL — ABNORMAL HIGH (ref 70–99)
Potassium: 3.2 mmol/L — ABNORMAL LOW (ref 3.5–5.1)
Sodium: 139 mmol/L (ref 135–145)

## 2021-02-19 LAB — TROPONIN I (HIGH SENSITIVITY)
Troponin I (High Sensitivity): 14 ng/L (ref <18)
Troponin I (High Sensitivity): 13 ng/L (ref <18)

## 2021-02-19 MED ORDER — BENZONATATE 200 MG PO CAPS: 200.0000 mg | ORAL_CAPSULE | Freq: Three times a day (TID) | ORAL | 0 refills | Status: DC | PRN

## 2021-02-19 MED ORDER — OXYCODONE-ACETAMINOPHEN 5-325 MG PO TABS
1.0000 | ORAL_TABLET | Freq: Once | ORAL | Status: AC
  Administered 2021-02-19: 1 via ORAL
  Filled 2021-02-19: qty 1

## 2021-02-19 MED ORDER — ALBUTEROL SULFATE HFA 108 (90 BASE) MCG/ACT IN AERS: 2.0000 | INHALATION_SPRAY | RESPIRATORY_TRACT | 0 refills | Status: AC | PRN

## 2021-02-19 MED ORDER — ALBUTEROL SULFATE HFA 108 (90 BASE) MCG/ACT IN AERS: 2.0000 | INHALATION_SPRAY | RESPIRATORY_TRACT | 0 refills | Status: DC | PRN

## 2021-02-19 MED ORDER — DOXYCYCLINE HYCLATE 100 MG PO CAPS: 100.0000 mg | ORAL_CAPSULE | Freq: Two times a day (BID) | ORAL | 0 refills | Status: DC

## 2021-02-19 NOTE — ED Provider Notes (Signed)
[facility] EMERGENCY DEPARTMENT Provider Note   CSN: [id number] Arrival date & time: 02/19/21  0125     History Chief Complaint  Patient presents with  . Chest Pain    Katrina Kelley is a 65 y.o. female.  Patient presents to the emergency department for evaluation of chest pain and shortness of breath.  Patient reports that symptoms have been ongoing for several weeks.  No associated fever, nausea, diaphoresis.  She does report that she is very "congested" with a cough productive of sputum.        Past Medical History:  Diagnosis Date  . Anxiety   . Arthritis   . Bipolar 1 disorder ([place])   . 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[place])   . Depression   . Schizoaffective disorder [facility])     Patient Active Problem List   Diagnosis Date Noted  . MDD (major depressive disorder), recurrent severe, without psychosis ([place]) 05/09/2018  . Cocaine abuse with cocaine-induced mood disorder ([place]) 01/29/2018  . 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 Alcohol use disorder, moderate, dependence ([place]) 03/17/2016  . Cocaine use disorder, severe, dependence ([place]) 03/17/2016  . Substance induced mood disorder ([place]) 03/17/2016  . Chronic foot pain 02/07/2016  . Osteoarthrosis of ankle and foot 02/07/2016  . Appendicitis 03/12/2014  . Major depression, recurrent, chronic ([place]) 08/20/2012    Past Surgical History:  Procedure Laterality Date  . ABDOMINAL HYSTERECTOMY    . APPENDECTOMY    . ELEVATION OF DEPRESSED SKULL FRACTURE    . FOOT ARTHRODESIS     rt and lt  . FRACTURE SURGERY  2005   fx both lower legs   . LAPAROSCOPIC APPENDECTOMY N/A 03/12/2014   Procedure: APPENDECTOMY LAPAROSCOPIC;  Surgeon: [name], MD;  Location: [place];  Service: General;  Laterality: N/A;  . TOOTH EXTRACTION  11/27/2011   Procedure: DENTAL RESTORATION/EXTRACTIONS;  Surgeon: [name], DDS;  Location: [facility];  Service: Oral Surgery;  Laterality: N/A;  just extractions     OB History   No obstetric history on file.     Family History  Problem Relation Age of Onset  . Hyperlipidemia Son     Social History   Tobacco Use  . Smoking status: Current Every Day Smoker    Packs/day: 0.50    Years: 38.00    Pack years: 19.00    Types: Cigarettes    Last attempt to quit: 03/22/2017    Years since quitting: 3.9  . Smokeless tobacco: Never Used  Vaping Use  . Vaping Use: Never used  Substance Use Topics  . Alcohol use: Yes    Comment: states 1 beer 2 days ago.  . Drug use: Not Currently    Types: Cocaine    Comment: states last used 1 year ago.    Home Medications Prior to Admission medications   Medication Sig Start Date End Date Taking? Authorizing Provider  acetaminophen (TYLENOL) 325 MG tablet Take 650 mg by mouth 4 (four) times daily.    [provider]  albuterol (VENTOLIN HFA) 108 (90 Base) MCG/ACT inhaler Inhale 2 puffs into the lungs every 4 (four) hours as needed for wheezing or shortness of breath. 02/19/21   Orpah Greek, MD  benzonatate (TESSALON) 200 MG capsule Take 1 capsule (200 mg total) by mouth 3 (three) times daily as needed for cough. 02/19/21   Orpah Greek, MD  calcium citrate (CALCITRATE - DOSED  IN MG ELEMENTAL CALCIUM) 950 MG tablet Take 200 mg of elemental calcium by mouth daily.    [provider]  carbamazepine (TEGRETOL) 200 MG tablet Take 1 tablet (200 mg total) by mouth 2 (two) times daily. 05/15/18   Mordecai Maes, NP  cholecalciferol (VITAMIN D) 25 MCG (1000 UT) tablet Take 1,000 Units by mouth daily.    [provider]  doxycycline (VIBRAMYCIN) 100 MG capsule Take 1 capsule (100 mg total) by mouth 2 (two) times daily. 02/19/21   Orpah Greek, MD  folic acid (FOLVITE) 1 MG tablet Take 1 mg by mouth 2 (two) times daily.    [provider]  iron polysaccharides (NIFEREX) 150 MG capsule Take  150 mg by mouth daily.    [provider]  Multiple Vitamins-Minerals (MULTIVITAMIN ADULT) TABS Take 1 tablet by mouth daily.    [provider]  nitroGLYCERIN (NITROSTAT) 0.4 MG SL tablet Place 1 tablet (0.4 mg total) under the tongue as needed for chest pain. 05/15/18   Mordecai Maes, NP  ondansetron (ZOFRAN) 4 MG tablet Take 4 mg by mouth every 8 (eight) hours as needed for nausea or vomiting.    [provider]  polyethylene glycol (MIRALAX / GLYCOLAX) packet Take 17 g by mouth daily.    [provider]  predniSONE (DELTASONE) 10 MG tablet Take 2 tablets (20 mg total) by mouth daily. 11/21/19   Pattricia Boss, MD  QUEtiapine (SEROQUEL) 100 MG tablet Take 1 tablet (100 mg total) by mouth at bedtime. Patient taking differently: Take 200 mg by mouth at bedtime.  05/15/18   Mordecai Maes, NP  rivaroxaban (XARELTO) 20 MG TABS tablet Take 20 mg by mouth daily with supper.    [provider]  sertraline (ZOLOFT) 100 MG tablet Take 1 tablet (100 mg total) by mouth daily. Patient not taking: Reported on 11/12/2018 05/16/18   Mordecai Maes, NP  thiamine 100 MG tablet Take 1 tablet (100 mg total) by mouth daily. 05/16/18   Mordecai Maes, NP    Allergies    Demerol, Latex, Penicillins, Strawberry extract, and Vicodin [hydrocodone-acetaminophen]  Review of Systems   Review of Systems  Respiratory: Positive for cough and shortness of breath.   Cardiovascular: Positive for chest pain.  All other systems reviewed and are negative.   Physical Exam Updated Vital Signs BP (!) 170/96 (BP Location: Left Arm)   Pulse 83   Temp 98.4 F (36.9 C) (Oral)   Resp 20   Ht 5\' 8"  (1.727 m)   Wt 80.7 kg   LMP 03/19/1985   SpO2 100%   BMI 27.06 kg/m   Physical Exam Vitals and nursing note reviewed.  Constitutional:      General: She is not in acute distress.    Appearance: Normal appearance. She is well-developed.  HENT:     Head: Normocephalic and  atraumatic.     Right Ear: Hearing normal.     Left Ear: Hearing normal.     Nose: Nose normal.  Eyes:     Conjunctiva/sclera: Conjunctivae normal.     Pupils: Pupils are equal, round, and reactive to light.  Cardiovascular:     Rate and Rhythm: Regular rhythm.     Heart sounds: S1 normal and S2 normal. No murmur heard. No friction rub. No gallop.   Pulmonary:     Effort: Pulmonary effort is normal. No respiratory distress.     Breath sounds: Normal breath sounds.  Chest:     Chest wall:  No tenderness.  Abdominal:     General: Bowel sounds are normal.     Palpations: Abdomen is soft.     Tenderness: There is no abdominal tenderness. There is no guarding or rebound. Negative signs include Murphy's sign and McBurney's sign.     Hernia: No hernia is present.  Musculoskeletal:        General: Normal range of motion.     Cervical back: Normal range of motion and neck supple.  Skin:    General: Skin is warm and dry.     Findings: No rash.  Neurological:     Mental Status: She is alert and oriented to person, place, and time.     GCS: GCS eye subscore is 4. GCS verbal subscore is 5. GCS motor subscore is 6.     Cranial Nerves: No cranial nerve deficit.     Sensory: No sensory deficit.     Coordination: Coordination normal.  Psychiatric:        Speech: Speech normal.        Behavior: Behavior normal.        Thought Content: Thought content normal.     ED Results / Procedures / Treatments   Labs (all labs ordered are listed, but only abnormal results are displayed) Labs Reviewed  BASIC METABOLIC PANEL - Abnormal; Notable for the following components:      Result Value   Potassium 3.2 (*)    Glucose, Bld 134 (*)    Calcium 8.5 (*)    All other components within normal limits  CBC - Abnormal; Notable for the following components:   WBC 3.6 (*)    All other components within normal limits  TROPONIN I (HIGH SENSITIVITY)  TROPONIN I (HIGH SENSITIVITY)     EKG None  Radiology DG Chest 2 View  Result Date: 02/19/2021 CLINICAL DATA:  Chest pain EXAM: CHEST - 2 VIEW COMPARISON:  11/21/2019 FINDINGS: The heart size and mediastinal contours are within normal limits. Both lungs are clear. The visualized skeletal structures are unremarkable. IMPRESSION: No active cardiopulmonary disease. Electronically Signed   By: Ulyses Jarred M.D.   On: 02/19/2021 02:30    Procedures Procedures   Medications Ordered in ED Medications  oxyCODONE-acetaminophen (PERCOCET/ROXICET) 5-325 MG per tablet 1 tablet (1 tablet Oral Given 02/19/21 0700)    ED Course  I have reviewed the triage vital signs and the nursing notes.  Pertinent labs & imaging results that were available during my care of the patient were reviewed by me and considered in my medical decision making (see chart for details).    MDM Rules/Calculators/A&P                          Patient presents to the emergency department for evaluation of cough, chest congestion with tightness in her chest and shortness of breath.  Patient's symptoms appear to be pulmonary in origin.  No evidence of pneumonia on exam.  Cardiac evaluation is unremarkable.  Final Clinical Impression(s) / ED Diagnoses Final diagnoses:  Upper respiratory tract infection, unspecified type    Rx / DC Orders ED Discharge Orders         Ordered    doxycycline (VIBRAMYCIN) 100 MG capsule  2 times daily,   Status:  Discontinued        02/19/21 0745    albuterol (VENTOLIN HFA) 108 (90 Base) MCG/ACT inhaler  Every 4 hours PRN,   Status:  Discontinued  02/19/21 0745    benzonatate (TESSALON) 200 MG capsule  3 times daily PRN,   Status:  Discontinued        02/19/21 0745    albuterol (VENTOLIN HFA) 108 (90 Base) MCG/ACT inhaler  Every 4 hours PRN        02/19/21 0745    benzonatate (TESSALON) 200 MG capsule  3 times daily PRN        02/19/21 0745    doxycycline (VIBRAMYCIN) 100 MG capsule  2 times daily        02/19/21  0745           Makyle Eslick, Gwenyth Allegra, MD 02/19/21 0745

## 2021-02-19 NOTE — ED Notes (Signed)
IV attempt unsuccessful by this nurse. Pt moved arm when nurse inserted IV and then requested for IV to be removed. Removed IV. Caryl Pina, RN attempted an IV and was unsuccessful.

## 2021-02-19 NOTE — ED Triage Notes (Signed)
Patient reports left chest pain with mild SOB this evening , no emesis or diaphoresis .

## 2021-02-21 ENCOUNTER — Telehealth: Payer: Self-pay | Admitting: *Deleted

## 2021-02-21 ENCOUNTER — Encounter: Payer: Self-pay | Admitting: *Deleted

## 2021-02-21 NOTE — Telephone Encounter (Signed)
[facility]/client not in system

## 2021-02-21 NOTE — Telephone Encounter (Signed)
Appointment made with [place] Tuesday May 17th 12:00.
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
## 2021-02-21 NOTE — Telephone Encounter (Signed)
Called Internal Medicine for follow up per hospital d/c papers. Receptionist would not make an appt/ reported client self discharged to [facility].

## 2021-03-22 ENCOUNTER — Other Ambulatory Visit: Payer: Self-pay

## 2021-03-22 ENCOUNTER — Emergency Department (HOSPITAL_COMMUNITY)
Admission: EM | Admit: 2021-03-22 | Discharge: 2021-03-23 | Disposition: A | Discharge status: Home / Self Care | Payer: Medicare Other | Attending: Emergency Medicine | Admitting: Emergency Medicine

## 2021-03-22 ENCOUNTER — Emergency Department (HOSPITAL_COMMUNITY): Payer: Medicare Other

## 2021-03-22 DIAGNOSIS — R519 Headache, unspecified: Secondary | ICD-10-CM | POA: Diagnosis present

## 2021-03-22 DIAGNOSIS — G4489 Other headache syndrome: Secondary | ICD-10-CM | POA: Diagnosis not present

## 2021-03-22 DIAGNOSIS — Z91040 Latex allergy status: Secondary | ICD-10-CM | POA: Insufficient documentation

## 2021-03-22 DIAGNOSIS — Z7901 Long term (current) use of anticoagulants: Secondary | ICD-10-CM | POA: Insufficient documentation

## 2021-03-22 DIAGNOSIS — R059 Cough, unspecified: Secondary | ICD-10-CM | POA: Diagnosis not present

## 2021-03-22 DIAGNOSIS — F17210 Nicotine dependence, cigarettes, uncomplicated: Secondary | ICD-10-CM | POA: Diagnosis not present

## 2021-03-22 NOTE — ED Notes (Signed)
Pt called x3 for xray, no answer at this time.

## 2021-03-22 NOTE — ED Triage Notes (Signed)
[facility] from jail for headache since last night. No OTC meds PTA.

## 2021-03-22 NOTE — ED Provider Notes (Signed)
Emergency Medicine Provider Triage Evaluation Note  Katrina Kelley , a 65 y.o. female  was evaluated in triage.  Pt complains of hypertension.  Per jail papers "patient is lethargic with headahce and light sensitivity, BP elevated at 175/100 with heart rate 65.  Patient LOC altered..."  Has been on librium for alcohol withdrawal.   Review of Systems  Positive: Headache, hypertension Negative: Fever, chest pain  Physical Exam  BP (!) 126/115 (BP Location: Left Arm)   Pulse 66   Temp (!) 97.5 F (36.4 C) (Oral)   Resp 14   LMP 03/19/1985   SpO2 97%  Gen:   Awake, no distress   Resp:  Normal effort  MSK:   Moves extremities without difficulty  Other:  Speech is not slurred.  Answers questions appropriately. Asks for something to drink and eat.   Medical Decision Making  Medically screening exam initiated at 1:45 PM.  Appropriate orders placed.  Katrina Kelley was informed that the remainder of the evaluation will be completed by another provider, this initial triage assessment does not replace that evaluation, and the importance of remaining in the ED until their evaluation is complete.     Katrina Glass, PA-C 03/22/21 1348    Ripley Fraise, MD 03/23/21 [phone]

## 2021-03-23 ENCOUNTER — Emergency Department (HOSPITAL_COMMUNITY): Payer: Medicare Other

## 2021-03-23 DIAGNOSIS — G4489 Other headache syndrome: Secondary | ICD-10-CM | POA: Diagnosis not present

## 2021-03-23 MED ORDER — ACETAMINOPHEN 325 MG PO TABS
650.0000 mg | ORAL_TABLET | Freq: Once | ORAL | Status: AC
  Administered 2021-03-23: 650 mg via ORAL
  Filled 2021-03-23: qty 2

## 2021-03-23 MED ORDER — DOXYCYCLINE HYCLATE 100 MG PO CAPS: 100.0000 mg | ORAL_CAPSULE | Freq: Two times a day (BID) | ORAL | 0 refills | Status: DC

## 2021-03-23 MED ORDER — IBUPROFEN 400 MG PO TABS
400.0000 mg | ORAL_TABLET | Freq: Once | ORAL | Status: AC
  Administered 2021-03-23: 400 mg via ORAL
  Filled 2021-03-23: qty 1

## 2021-03-23 NOTE — Discharge Instructions (Addendum)

## 2021-03-23 NOTE — ED Provider Notes (Signed)
[facility] EMERGENCY DEPARTMENT Provider Note   CSN: [id number] Arrival date & time: 03/22/21  1210     History Chief Complaint  Patient presents with  . Headache    Katrina Kelley is a 65 y.o. female.  The history is provided by the patient.  Headache Pain location:  Frontal Quality: throbbing. Onset quality:  Gradual Duration:  1 day Timing:  Constant Progression:  Unchanged Chronicity:  New Context: bright light   Relieved by:  Nothing Worsened by:  Light Associated symptoms: blurred vision, cough and photophobia   Associated symptoms: no fever and no weakness    [facility]  Patient has a history of bipolar, substance abuse.  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 It is reported the patient began having headache while in jail.  The paperwork from jail reports "patient was lethargic with headache and light sensitivity and elevated blood pressure, with altered LOC"  Patient also reports having upper respiratory infection with cough.  She reports a recent negative COVID test while in jail  No focal weakness is reported.    Past Medical History:  Diagnosis Date  . Anxiety   . Arthritis   . Bipolar 1 disorder ([place])   . 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 Depression   . Schizoaffective disorder [facility])     Patient Active Problem List   Diagnosis Date Noted  . 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 Cocaine abuse with cocaine-induced mood disorder ([place]) 01/29/2018  . 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 Alcohol use disorder, moderate, dependence ([place]) 03/17/2016  . Cocaine use disorder, severe, dependence ([place]) 03/17/2016  . Substance induced mood disorder ([place]) 03/17/2016  . Chronic foot pain 02/07/2016  . Osteoarthrosis of ankle and foot 02/07/2016  .  Appendicitis 03/12/2014  . Major depression, recurrent, chronic ([place]) 08/20/2012    Past Surgical History:  Procedure Laterality Date  . ABDOMINAL HYSTERECTOMY    . APPENDECTOMY    . ELEVATION OF DEPRESSED SKULL FRACTURE    . FOOT ARTHRODESIS     rt and lt  . FRACTURE SURGERY  2005   fx both lower legs   . LAPAROSCOPIC APPENDECTOMY N/A 03/12/2014   Procedure: APPENDECTOMY LAPAROSCOPIC;  Surgeon: [name], MD;  Location: [facility];  Service: General;  Laterality: N/A;  . TOOTH EXTRACTION  11/27/2011   Procedure: DENTAL RESTORATION/EXTRACTIONS;  Surgeon: [name], DDS;  Location: [place];  Service: Oral Surgery;  Laterality: N/A;  just extractions     OB History   No obstetric history on file.     Family History  Problem Relation Age of Onset  . Hyperlipidemia Son     Social History   Tobacco Use  . Smoking status: Current Every Day Smoker    Packs/day: 0.50    Years: 38.00    Pack years: 19.00    Types: Cigarettes    Last attempt to quit: 03/22/2017    Years since quitting: 4.0  . Smokeless tobacco: Never Used  Vaping Use  . Vaping Use: Never used  Substance Use Topics  . Alcohol use: Yes    Comment: states 1 beer 2 days ago.  . Drug use: Not Currently    Types: Cocaine    Comment: states last used 1 year ago.    Home Medications Prior to Admission medications  Medication Sig Start Date End Date Taking? Authorizing Provider  acetaminophen (TYLENOL) 325 MG tablet Take 650 mg by mouth 4 (four) times daily.    [provider]  albuterol (VENTOLIN HFA) 108 (90 Base) MCG/ACT inhaler Inhale 2 puffs into the lungs every 4 (four) hours as needed for wheezing or shortness of breath. 02/19/21   Orpah Greek, MD  benzonatate (TESSALON) 200 MG capsule Take 1 capsule (200 mg total) by mouth 3 (three) times daily as needed for cough. 02/19/21   Orpah Greek, MD  calcium citrate (CALCITRATE - DOSED IN MG ELEMENTAL CALCIUM) 950 MG  tablet Take 200 mg of elemental calcium by mouth daily.    [provider]  carbamazepine (TEGRETOL) 200 MG tablet Take 1 tablet (200 mg total) by mouth 2 (two) times daily. 05/15/18   Mordecai Maes, NP  cholecalciferol (VITAMIN D) 25 MCG (1000 UT) tablet Take 1,000 Units by mouth daily.    [provider]  doxycycline (VIBRAMYCIN) 100 MG capsule Take 1 capsule (100 mg total) by mouth 2 (two) times daily. 02/19/21   Orpah Greek, MD  folic acid (FOLVITE) 1 MG tablet Take 1 mg by mouth 2 (two) times daily.    [provider]  iron polysaccharides (NIFEREX) 150 MG capsule Take 150 mg by mouth daily.    [provider]  Multiple Vitamins-Minerals (MULTIVITAMIN ADULT) TABS Take 1 tablet by mouth daily.    [provider]  nitroGLYCERIN (NITROSTAT) 0.4 MG SL tablet Place 1 tablet (0.4 mg total) under the tongue as needed for chest pain. 05/15/18   Mordecai Maes, NP  ondansetron (ZOFRAN) 4 MG tablet Take 4 mg by mouth every 8 (eight) hours as needed for nausea or vomiting.    [provider]  polyethylene glycol (MIRALAX / GLYCOLAX) packet Take 17 g by mouth daily.    [provider]  predniSONE (DELTASONE) 10 MG tablet Take 2 tablets (20 mg total) by mouth daily. 11/21/19   Pattricia Boss, MD  QUEtiapine (SEROQUEL) 100 MG tablet Take 1 tablet (100 mg total) by mouth at bedtime. Patient taking differently: Take 200 mg by mouth at bedtime.  05/15/18   Mordecai Maes, NP  thiamine 100 MG tablet Take 1 tablet (100 mg total) by mouth daily. 05/16/18   Mordecai Maes, NP  rivaroxaban (XARELTO) 20 MG TABS tablet Take 20 mg by mouth daily with supper.  03/23/21  [provider]  sertraline (ZOLOFT) 100 MG tablet Take 1 tablet (100 mg total) by mouth daily. Patient not taking: Reported on 11/12/2018 05/16/18 03/23/21  Mordecai Maes, NP    Allergies    Demerol, Latex, Penicillins, Strawberry extract, and Vicodin  [hydrocodone-acetaminophen]  Review of Systems   Review of Systems  Constitutional: Negative for fever.  Eyes: Positive for blurred vision and photophobia.       Denies visual loss   Respiratory: Positive for cough.   Cardiovascular: Negative for chest pain.  Neurological: Positive for headaches. Negative for weakness.  All other systems reviewed and are negative.   Physical Exam Updated Vital Signs BP 122/77 (BP Location: Right Arm)   Pulse 67   Temp 98.6 F (37 C) (Oral)   Resp 20   LMP 03/19/1985   SpO2 97%   Physical Exam CONSTITUTIONAL: Well developed, no distress HEAD: chronic deformity of frontal skull from previous surgery.  No other signs of acute traumatic injury EYES: EOMI/PERRL, no nystagmus, no ptosis, no corneal haziness, no conjunctival injection ENMT: Mucous membranes  moist NECK: supple no meningeal signs  SPINE/BACK:entire spine nontender CV: S1/S2 noted, no murmurs/rubs/gallops noted LUNGS: Lungs are clear to auscultation bilaterally, no apparent distress ABDOMEN: soft, nontender, no rebound or guarding GU:no cva tenderness NEURO:Awake/alert, face symmetric, no arm or leg drift is noted Cranial nerves 3/4/5/6/04/23/09/11/12 tested and intact Gait normal without ataxia but limited as patient is in shackles No obvious sensory deficit EXTREMITIES: pulses normal, patient in shackles SKIN: warm, color normal PSYCH: Flat affect  ED Results / Procedures / Treatments   Labs (all labs ordered are listed, but only abnormal results are displayed) Labs Reviewed - No data to display  EKG None  Radiology CT Head Wo Contrast  Result Date: 03/22/2021 CLINICAL DATA:  Headache.  Photosensitivity.  Hypertension. EXAM: CT HEAD WITHOUT CONTRAST TECHNIQUE: Contiguous axial images were obtained from the base of the skull through the vertex without intravenous contrast. COMPARISON:  02/02/2005 FINDINGS: Brain: Progressive right frontal lobe encephalomalacia with resolved  previous hemorrhage seen in that region. Interval left frontal lobe encephalomalacia. Interval extensive patchy white matter low density in both cerebral hemispheres. Mild to moderate enlargement of the ventricles and subarachnoid spaces with progression. No intracranial hemorrhage, mass lesion or CT evidence of acute infarction. Vascular: No hyperdense vessel or unexpected calcification. Skull: Stable frontal post craniotomy changes. Sinuses/Orbits: No acute finding. Other: None. IMPRESSION: 1. No acute abnormality. 2. Bifrontal encephalomalacia, extensive chronic small vessel white matter ischemic changes and mild to moderate diffuse cerebral and cerebellar atrophy. Electronically Signed   By: Claudie Revering M.D.   On: 03/22/2021 14:53   DG Chest Port 1 View  Result Date: 03/23/2021 CLINICAL DATA:  Cough EXAM: PORTABLE CHEST 1 VIEW COMPARISON:  02/19/2021 FINDINGS: The lungs are well expanded. There is increasing bibasilar atelectasis or infiltrate. No pneumothorax or pleural effusion. Cardiac size within normal limits. Pulmonary vascularity is normal. No acute bone abnormality. IMPRESSION: Developing bibasilar atelectasis or infiltrate. Electronically Signed   By: Fidela Salisbury MD   On: 03/23/2021 03:21    Procedures Procedures   Medications Ordered in ED Medications  ibuprofen (ADVIL) tablet 400 mg (400 mg Oral Given 03/23/21 0242)  acetaminophen (TYLENOL) tablet 650 mg (650 mg Oral Given 03/23/21 0242)    ED Course  I have reviewed the triage vital signs and the nursing notes.  Pertinent  imaging results that were available during my care of the patient were reviewed by me and considered in my medical decision making (see chart for details).    MDM Rules/Calculators/A&P                          2:40 AM Patient [facility] the [facility] jail with 2 issues.  It reported she has had a headache and she is also reporting cough and symptoms of upper respiratory infection.  She reports recent  negative COVID test  In terms of her headache, she is in no acute distress.  No obvious neurodeficits but patient is in upper and lower extremity shackles which limits exam.  She reports pain around her eyes, but no specific eye pain or visual loss is reported.  There are no objective signs of acute glaucoma Patient has no meningeal signs to suggest meningitis.  Her blood pressure has improved while she is waiting in the emergency department Patient does have previous history of subdural hematoma and requiring craniotomy.  No acute findings on CT imaging tonight  Patient also reported cough and upper respiratory infection. Will obtain chest  x-ray. If this is negative, I anticipate discharge back to jail  Of note, patient reports she no longer takes Xarelto  3:40 AM Patient resting comfortably and reports feeling overall improved.  She had been requesting food and drink earlier. Low suspicion for acute neurologic emergency.  Chest x-ray was personally reviewed by myself and reveals potential infiltrate versus atelectasis. I feel it is reasonable given her symptoms to start her on oral antibiotics.  Patient will be discharged with law enforcement  Final Clinical Impression(s) / ED Diagnoses Final diagnoses:  Other headache syndrome  Cough    Rx / DC Orders ED Discharge Orders         Ordered    doxycycline (VIBRAMYCIN) 100 MG capsule  2 times daily        03/23/21 0339           Ripley Fraise, MD 03/23/21 [phone]

## 2021-04-30 ENCOUNTER — Emergency Department (HOSPITAL_COMMUNITY)
Admission: EM | Admit: 2021-04-30 | Discharge: 2021-04-30 | Disposition: A | Discharge status: Home / Self Care | Payer: Medicare Other | Attending: Emergency Medicine | Admitting: Emergency Medicine

## 2021-04-30 ENCOUNTER — Other Ambulatory Visit: Payer: Self-pay

## 2021-04-30 ENCOUNTER — Encounter (HOSPITAL_COMMUNITY): Payer: Self-pay

## 2021-04-30 DIAGNOSIS — Y908 Blood alcohol level of 240 mg/100 ml or more: Secondary | ICD-10-CM | POA: Insufficient documentation

## 2021-04-30 DIAGNOSIS — F10121 Alcohol abuse with intoxication delirium: Secondary | ICD-10-CM | POA: Diagnosis present

## 2021-04-30 DIAGNOSIS — F10921 Alcohol use, unspecified with intoxication delirium: Secondary | ICD-10-CM

## 2021-04-30 HISTORY — DX: Alcohol abuse, uncomplicated: F10.10

## 2021-04-30 LAB — COMPREHENSIVE METABOLIC PANEL
ALT: 46 U/L — ABNORMAL HIGH (ref 0–44)
AST: 72 U/L — ABNORMAL HIGH (ref 15–41)
Albumin: 3.4 g/dL — ABNORMAL LOW (ref 3.5–5.0)
Alkaline Phosphatase: 55 U/L (ref 38–126)
Anion gap: 10 (ref 5–15)
BUN: 19 mg/dL (ref 8–23)
CO2: 21 mmol/L — ABNORMAL LOW (ref 22–32)
Calcium: 8.1 mg/dL — ABNORMAL LOW (ref 8.9–10.3)
Chloride: 108 mmol/L (ref 98–111)
Creatinine, Ser: 0.82 mg/dL (ref 0.44–1.00)
GFR, Estimated: 48 mL/min — ABNORMAL LOW (ref >60)
Glucose, Bld: 100 mg/dL — ABNORMAL HIGH (ref 70–99)
Potassium: 3.7 mmol/L (ref 3.5–5.1)
Sodium: 139 mmol/L (ref 135–145)
Total Bilirubin: 0.5 mg/dL (ref 0.3–1.2)
Total Protein: 7.8 g/dL (ref 6.5–8.1)

## 2021-04-30 LAB — ETHANOL: Alcohol, Ethyl (B): 345 mg/dL (ref <10)

## 2021-04-30 LAB — CBC WITH DIFFERENTIAL/PLATELET
Abs Immature Granulocytes: 0.01 10*3/uL (ref 0.00–0.07)
Basophils Absolute: 0 10*3/uL (ref 0.0–0.1)
Basophils Relative: 1 %
Eosinophils Absolute: 0.2 10*3/uL (ref 0.0–0.5)
Eosinophils Relative: 3 %
HCT: 36 % (ref 36.0–46.0)
Hemoglobin: 12.1 g/dL (ref 12.0–15.0)
Immature Granulocytes: 0 %
Lymphocytes Relative: 55 %
Lymphs Abs: 4.7 10*3/uL — ABNORMAL HIGH (ref 0.7–4.0)
MCH: 31.8 pg (ref 26.0–34.0)
MCHC: 33.6 g/dL (ref 30.0–36.0)
MCV: 94.5 fL (ref 80.0–100.0)
Monocytes Absolute: 0.8 10*3/uL (ref 0.1–1.0)
Monocytes Relative: 10 %
Neutro Abs: 2.6 10*3/uL (ref 1.7–7.7)
Neutrophils Relative %: 31 %
Platelets: 163 10*3/uL (ref 150–400)
RBC: 3.81 MIL/uL — ABNORMAL LOW (ref 3.87–5.11)
RDW: 13.3 % (ref 11.5–15.5)
WBC: 8.4 10*3/uL (ref 4.0–10.5)
nRBC: 0 % (ref 0.0–0.2)

## 2021-04-30 MED ORDER — SODIUM CHLORIDE 0.9 % IV BOLUS
1000.0000 mL | Freq: Once | INTRAVENOUS | Status: AC
  Administered 2021-04-30: 1000 mL via INTRAVENOUS

## 2021-04-30 NOTE — ED Provider Notes (Signed)
Care of the patient assumed at the change of shift. Here for acute alcohol intoxication, pending sobering.  Physical Exam  BP (!) 131/93   Pulse 86   Temp 97.6 F (36.4 C) (Oral)   Resp 18   Ht 5\' 7"  (1.702 m)   Wt 99.8 kg   SpO2 95%   BMI 34.46 kg/m   Physical Exam Awake and alert Steady gait to bathroom ED Course/Procedures     Procedures  MDM  Patient continues to sober. She does not have a sober ride. Will continue to monitor until clinically sober enough for safe discharge.        Truddie Hidden, MD 04/30/21 [phone]

## 2021-04-30 NOTE — ED Provider Notes (Signed)
[facility] EMERGENCY DEPARTMENT Provider Note   CSN: [id number] Arrival date & time: 04/30/21  1336     History Chief Complaint  Patient presents with   Alcohol Intoxication    Katrina Kelley is a 65 y.o. female.  HPI  Patient presents via EMS after being found sleeping in a [employer]  Patient does not provide any details of her history, is clinically intoxicated, level 5 caveat secondary to mental status change. No demographic information available, patient does not offer her name, no known medical history. Per EMS report the patient was witnessed to be walking in [employer] parking lot, with a bottle of vodka when she reportedly laid down in the drive-through line, and may have been asleep.  No reported fall, trauma. No past medical, surgical, social available secondary to mental status change.      OB History   No obstetric history on file.     No family history on file.     Home Medications Prior to Admission medications   Not on File    Allergies    Patient has no known allergies.  Review of Systems   Review of Systems  Unable to perform ROS: Mental status change   Physical Exam Updated Vital Signs BP 92/64   Pulse 82   Resp 20   Ht 5\' 7"  (1.702 m)   Wt 99.8 kg   SpO2 98%   BMI 34.46 kg/m   Physical Exam Vitals and nursing note reviewed.  Constitutional:      General: She is not in acute distress.    Appearance: She is well-developed.  HENT:     Head: Normocephalic and atraumatic.     Comments: No obvious trauma, grass, dirt, lays on the right side of her hair Eyes:     Conjunctiva/sclera: Conjunctivae normal.  Cardiovascular:     Rate and Rhythm: Normal rate and regular rhythm.  Pulmonary:     Effort: Pulmonary effort is normal. No respiratory distress.     Breath sounds: Normal breath sounds. No stridor.  Abdominal:     General: There is no distension.  Skin:    General: Skin is warm and dry.   Neurological:     Mental Status: She is alert.     Cranial Nerves: No cranial nerve deficit.     Comments: Clinically intoxicated, moving all extremity spontaneously    ED Results / Procedures / Treatments   Labs (all labs ordered are listed, but only abnormal results are displayed) Labs Reviewed  COMPREHENSIVE METABOLIC PANEL - Abnormal; Notable for the following components:      Result Value   CO2 21 (*)    Glucose, Bld 100 (*)    Calcium 8.1 (*)    Albumin 3.4 (*)    AST 72 (*)    ALT 46 (*)    GFR, Estimated 48 (*)    All other components within normal limits  ETHANOL - Abnormal; Notable for the following components:   Alcohol, Ethyl (B) 345 (*)    All other components within normal limits  CBC WITH DIFFERENTIAL/PLATELET - Abnormal; Notable for the following components:   RBC 3.81 (*)    Lymphs Abs 4.7 (*)    All other components within normal limits      Procedures Procedures   Medications Ordered in ED Medications  sodium chloride 0.9 % bolus 1,000 mL (1,000 mLs Intravenous New Bag/Given 04/30/21 1408)    ED Course  I  have reviewed the triage vital signs and the nursing notes.  Pertinent labs & imaging results that were available during my care of the patient were reviewed by me and considered in my medical decision making (see chart for details).   3:02 PM Patient calm, in no distress.  Initial labs notable for alcohol level 345, 4 times greater than the legal limit, likely contributing to the patient's hypersomnolence.  No evidence for trauma, fall, no distress, patient moves all extremities spontaneously and responds to stimuli.  Vital signs generally reassuring aside from borderline low pressure.  With no distress, suspicion for acute alcohol intoxication, patient will require monitoring, repeat evaluation.  Dr. Karle Starch is aware. MDM Rules/Calculators/A&P MDM   Amount and/or Complexity of Data Reviewed Clinical lab tests: reviewed and ordered Tests in the  medicine section of CPT: ordered and reviewed Decide to obtain previous medical records or to obtain history from someone other than the patient: yes Obtain history from someone other than the patient: yes Discuss the patient with other providers: yes  Risk of Complications, Morbidity, and/or Mortality Presenting problems: high Diagnostic procedures: high Management options: high  Critical Care Total time providing critical care: < 30 minutes  Patient Progress Patient progress: stable   Final Clinical Impression(s) / ED Diagnoses Final diagnoses:  Alcohol intoxication with delirium ([place])     Carmin Muskrat, MD 04/30/21 1504

## 2021-04-30 NOTE — ED Notes (Signed)
Verbally notified Dr Vanita Panda of pt's low bp and obtained verbal orders to give another liter bolus of NS

## 2021-04-30 NOTE — ED Notes (Signed)
Pt is ambulatory to the bathroom.

## 2021-04-30 NOTE — ED Triage Notes (Signed)
Pt arrived via [facility]. Per EMS [employer] employees saw pt walk to drive thru and lay down on the ground. Per ems, pt has a 1/4 of vodka left in half pint bottle of alcohol. Pt is oriented to self only. Pt Sa02 was 82% on RA. Pt was placed on 4L 02 per [place]

## 2021-04-30 NOTE — ED Notes (Signed)
Unable to get pt to sign MSE due to be AMS and intoxicated.

## 2021-05-02 ENCOUNTER — Encounter (HOSPITAL_COMMUNITY): Payer: Self-pay | Admitting: Emergency Medicine

## 2021-05-18 ENCOUNTER — Other Ambulatory Visit: Payer: Self-pay

## 2021-05-18 ENCOUNTER — Emergency Department (HOSPITAL_COMMUNITY)
Admission: EM | Admit: 2021-05-18 | Discharge: 2021-05-18 | Disposition: A | Discharge status: Home / Self Care | Payer: Medicare Other | Attending: Emergency Medicine | Admitting: Emergency Medicine

## 2021-05-18 ENCOUNTER — Encounter (HOSPITAL_COMMUNITY): Payer: Self-pay

## 2021-05-18 DIAGNOSIS — F4310 Post-traumatic stress disorder, unspecified: Secondary | ICD-10-CM | POA: Diagnosis not present

## 2021-05-18 DIAGNOSIS — Z7901 Long term (current) use of anticoagulants: Secondary | ICD-10-CM | POA: Diagnosis not present

## 2021-05-18 DIAGNOSIS — Z91040 Latex allergy status: Secondary | ICD-10-CM | POA: Insufficient documentation

## 2021-05-18 DIAGNOSIS — R45850 Homicidal ideations: Secondary | ICD-10-CM | POA: Insufficient documentation

## 2021-05-18 DIAGNOSIS — Z046 Encounter for general psychiatric examination, requested by authority: Secondary | ICD-10-CM | POA: Diagnosis present

## 2021-05-18 DIAGNOSIS — F1924 Other psychoactive substance dependence with psychoactive substance-induced mood disorder: Secondary | ICD-10-CM | POA: Diagnosis not present

## 2021-05-18 DIAGNOSIS — R45851 Suicidal ideations: Secondary | ICD-10-CM | POA: Diagnosis not present

## 2021-05-18 DIAGNOSIS — F329 Major depressive disorder, single episode, unspecified: Secondary | ICD-10-CM | POA: Diagnosis not present

## 2021-05-18 DIAGNOSIS — Z20822 Contact with and (suspected) exposure to covid-19: Secondary | ICD-10-CM | POA: Diagnosis not present

## 2021-05-18 DIAGNOSIS — N3000 Acute cystitis without hematuria: Secondary | ICD-10-CM | POA: Diagnosis not present

## 2021-05-18 DIAGNOSIS — Z008 Encounter for other general examination: Secondary | ICD-10-CM

## 2021-05-18 LAB — URINALYSIS, ROUTINE W REFLEX MICROSCOPIC
Bilirubin Urine: NEGATIVE
Glucose, UA: NEGATIVE mg/dL
Ketones, ur: NEGATIVE mg/dL
Nitrite: NEGATIVE
Protein, ur: 100 mg/dL — AB
RBC / HPF: >50 RBC/hpf — ABNORMAL HIGH (ref 0–5)
Specific Gravity, Urine: 1.025 (ref 1.005–1.030)
WBC, UA: >50 WBC/hpf — ABNORMAL HIGH (ref 0–5)
pH: 5 (ref 5.0–8.0)

## 2021-05-18 LAB — RESP PANEL BY RT-PCR (FLU A&B, COVID) ARPGX2
Influenza A by PCR: NEGATIVE
Influenza B by PCR: NEGATIVE
SARS Coronavirus 2 by RT PCR: NEGATIVE

## 2021-05-18 LAB — COMPREHENSIVE METABOLIC PANEL
ALT: 46 U/L — ABNORMAL HIGH (ref 0–44)
AST: 81 U/L — ABNORMAL HIGH (ref 15–41)
Albumin: 3.3 g/dL — ABNORMAL LOW (ref 3.5–5.0)
Alkaline Phosphatase: 68 U/L (ref 38–126)
Anion gap: 7 (ref 5–15)
BUN: 13 mg/dL (ref 8–23)
CO2: 25 mmol/L (ref 22–32)
Calcium: 8.6 mg/dL — ABNORMAL LOW (ref 8.9–10.3)
Chloride: 104 mmol/L (ref 98–111)
Creatinine, Ser: 0.76 mg/dL (ref 0.44–1.00)
GFR, Estimated: >60 mL/min (ref >60)
Glucose, Bld: 100 mg/dL — ABNORMAL HIGH (ref 70–99)
Potassium: 3.8 mmol/L (ref 3.5–5.1)
Sodium: 136 mmol/L (ref 135–145)
Total Bilirubin: 0.9 mg/dL (ref 0.3–1.2)
Total Protein: 8 g/dL (ref 6.5–8.1)

## 2021-05-18 LAB — CBC WITH DIFFERENTIAL/PLATELET
Abs Immature Granulocytes: 0.04 10*3/uL (ref 0.00–0.07)
Basophils Absolute: 0.1 10*3/uL (ref 0.0–0.1)
Basophils Relative: 1 %
Eosinophils Absolute: 0.1 10*3/uL (ref 0.0–0.5)
Eosinophils Relative: 1 %
HCT: 37.8 % (ref 36.0–46.0)
Hemoglobin: 12.8 g/dL (ref 12.0–15.0)
Immature Granulocytes: 1 %
Lymphocytes Relative: 47 %
Lymphs Abs: 2.9 10*3/uL (ref 0.7–4.0)
MCH: 31.9 pg (ref 26.0–34.0)
MCHC: 33.9 g/dL (ref 30.0–36.0)
MCV: 94.3 fL (ref 80.0–100.0)
Monocytes Absolute: 0.6 10*3/uL (ref 0.1–1.0)
Monocytes Relative: 10 %
Neutro Abs: 2.4 10*3/uL (ref 1.7–7.7)
Neutrophils Relative %: 40 %
Platelets: 228 10*3/uL (ref 150–400)
RBC: 4.01 MIL/uL (ref 3.87–5.11)
RDW: 15 % (ref 11.5–15.5)
WBC Morphology: >10
WBC: 6 10*3/uL (ref 4.0–10.5)
nRBC: 0 % (ref 0.0–0.2)

## 2021-05-18 LAB — RAPID URINE DRUG SCREEN, HOSP PERFORMED
Amphetamines: NOT DETECTED
Barbiturates: NOT DETECTED
Benzodiazepines: POSITIVE — AB
Cocaine: POSITIVE — AB
Opiates: NOT DETECTED
Tetrahydrocannabinol: POSITIVE — AB

## 2021-05-18 LAB — ACETAMINOPHEN LEVEL: Acetaminophen (Tylenol), Serum: <10 ug/mL — ABNORMAL LOW (ref 10–30)

## 2021-05-18 LAB — SALICYLATE LEVEL: Salicylate Lvl: <7 mg/dL — ABNORMAL LOW (ref 7.0–30.0)

## 2021-05-18 LAB — ETHANOL: Alcohol, Ethyl (B): <10 mg/dL (ref <10)

## 2021-05-18 MED ORDER — CEPHALEXIN 500 MG PO CAPS
500.0000 mg | ORAL_CAPSULE | Freq: Four times a day (QID) | ORAL | Status: DC
  Administered 2021-05-18: 500 mg via ORAL
  Filled 2021-05-18: qty 1

## 2021-05-18 MED ORDER — CEPHALEXIN 500 MG PO CAPS: 500.0000 mg | ORAL_CAPSULE | Freq: Four times a day (QID) | ORAL | 0 refills | Status: AC

## 2021-05-18 NOTE — ED Notes (Signed)
SW at bedside.
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
## 2021-05-18 NOTE — ED Notes (Signed)
Pt states she has been hearing voices for the past few weeks, she feels suicidal and homicidal and states she needs medical help and to be admitted for it.

## 2021-05-18 NOTE — ED Notes (Signed)
Pt moved to Rm 8 for TTS and privacy.

## 2021-05-18 NOTE — BH Assessment (Signed)
@  1529, Clinician requested nursing Baxter Flattery, RN) to to set up TTS machine for patient's initial assessment.

## 2021-05-18 NOTE — BH Assessment (Signed)
Disposition:   Per [facility] provider Pecolia Ades, RN) patient has been psych cleared patient. She is ok to discharge. Patient is  recommended to follow up with residential treatment at [facility]. Details will be noted in patient's AVS. In addition to resources for outpatient therapy and psych med management.   Also, [facility] provider has documented a note in patient's chart indicating their is no need for Zoloft and Gabapentin. Please print off note and add to patient's AVS. [place] will be asking for this documentation.   ---Nursing asked to please reiterate to patient that she will need to take this note to [facility] this Saturday.   Provided disposition updates to EDP (Dr. Armandina Gemma) and patient's nurse Baxter Flattery, NP).
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
## 2021-05-18 NOTE — ED Triage Notes (Signed)
Pt reports SI/HI and auditory hallucinations. Pt also reports symptoms of a UTI that began last night.

## 2021-05-18 NOTE — Progress Notes (Signed)
CSW asked to meet with pt by MD regarding housing needs.  CSW spoke with pt, who is irritable, and making multiple requests.  Pt initially denies asking for help with housing,  states she wants help getting into detox, then states she is hearing voices, then says she is too old to be on the street and wants to know what housing can be provided to her tonight. Per epic, pt was referred by psych for intake appt at [facility] on Saturday.  Pt is aware of this appt.  Psych also referred to the [facility] for outpt medication management and/or therapy.  Both are on AVS.  CSW discussed shelter options and the [facility].  Pt reports she is already working with the [facility] and "they haven't found me any place to live in year."  CSW provided list of all shelter options and encouraged pt to continue working with [facility].  Pt then asking about medication for UTI and RN and MD both informed.  Two bus passes provided for transportation tonight and to her intake appt at [facility] on Saturday. Lurline Idol, MSW, LCSW 8/3/20227:34 PM

## 2021-05-18 NOTE — Discharge Instructions (Addendum)
You were given a prescription for antibiotics. Please take the antibiotic prescription fully.   Please return to the emergency department for any new or worsening symptoms.

## 2021-05-18 NOTE — Consult Note (Signed)
Patient reports she was recently in jail and the provider there discontinued her Zoloft and gabapentin due to patient's report that it was not helping her. She reports that she uses Aleve for her pain instead of gabapentin.  Patient refused Lorayne Bender today while in the emergency department.   Has not been on medications for 2-4 weeks per patient report and does not wish to restart Zoloft or gabapentin. She is not experiencing adverse affects from being off of these medication.

## 2021-05-20 LAB — URINE CULTURE: Culture: >=100000 — AB

## 2021-05-21 ENCOUNTER — Telehealth: Payer: Self-pay | Admitting: Emergency Medicine

## 2021-05-21 NOTE — Telephone Encounter (Signed)
Post ED Visit - Positive Culture Follow-up  Culture report reviewed by antimicrobial stewardship pharmacist: [facility] Team '[]'$  Elenor Quinones, Pharm.D. '[]'$  Heide Guile, Pharm.D., BCPS AQ-ID '[]'$  Parks Neptune, Pharm.D., BCPS '[]'$  Alycia Rossetti, Pharm.D., BCPS '[]'$  [place], Pharm.D., BCPS, AAHIVP '[]'$  Legrand Como, Pharm.D., BCPS, AAHIVP '[]'$  Salome Arnt, PharmD, BCPS '[]'$  Johnnette Gourd, PharmD, BCPS '[]'$  Hughes Better, PharmD, BCPS '[]'$  Leeroy Cha, PharmD '[]'$  Laqueta Linden, PharmD, BCPS '[]'$  Albertina Parr, PharmD  [facility] Team '[]'$  Leodis Sias, PharmD '[]'$  Lindell Spar, PharmD '[]'$  Royetta Asal, PharmD '[]'$  Graylin Shiver, Rph '[]'$  Rema Fendt) Glennon Mac, PharmD '[]'$  Arlyn Dunning, PharmD '[]'$  Netta Cedars, PharmD '[x]'$  Ulice Dash, PharmD '[]'$  Leone Haven, PharmD '[]'$  Gretta Arab, PharmD '[]'$  Theodis Shove, PharmD '[]'$  Peggyann Juba, PharmD '[]'$  Reuel Boom, PharmD   Positive urine culture Treated with Cephalexin, organism sensitive to the same and no further patient follow-up is required at this time.  [place] 05/21/2021, 12:54 PM

## 2021-06-22 ENCOUNTER — Ambulatory Visit (HOSPITAL_COMMUNITY)
Admission: EM | Admit: 2021-06-22 | Discharge: 2021-06-22 | Disposition: A | Discharge status: Home / Self Care | Payer: Medicare Other | Attending: Family Medicine | Admitting: Family Medicine

## 2021-06-22 ENCOUNTER — Encounter: Payer: Self-pay | Admitting: *Deleted

## 2021-06-22 ENCOUNTER — Other Ambulatory Visit: Payer: Self-pay

## 2021-06-22 ENCOUNTER — Encounter (HOSPITAL_COMMUNITY): Payer: Self-pay | Admitting: Emergency Medicine

## 2021-06-22 DIAGNOSIS — N898 Other specified noninflammatory disorders of vagina: Secondary | ICD-10-CM | POA: Insufficient documentation

## 2021-06-22 MED ORDER — CLINDAMYCIN HCL 300 MG PO CAPS: 300.0000 mg | ORAL_CAPSULE | Freq: Two times a day (BID) | ORAL | 0 refills | Status: DC

## 2021-06-22 NOTE — Congregational Nurse Program (Signed)
  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 Yes    Visit Setting Church or Organization    Location Patient Served At [facility]    Patient Status Homeless    Medical Provider No    Insurance Medicaid;Medicare    Intervention Support;Refer    Housing/Utilities No permanent housing    Transportation Provided transportation assistance ([facility] transp,bus pass, taxi voucher, etc.)    Referrals Mobile Bus/Van - [place] came to [facility] requesting help with transportation to see a doctor about a female problem and did not elaborate. Gave two bus passes and information about [facility] with today's schedule.

## 2021-06-22 NOTE — ED Provider Notes (Signed)
[facility]    CSN: [id number] Arrival date & time: 06/22/21  1453      History   Chief Complaint Chief Complaint  Patient presents with   Vaginal Discharge    HPI Katrina Kelley is a 65 y.o. female.   Patient presenting today with 1 to 2-week history of white vaginal discharge that is malodorous.  She states she also has some irritation externally since onset.  Denies dysuria, hematuria, urinary frequency, nausea, vomiting, fevers, flank pain.  No new sexual partners or concern for STDs.  States she has a history of bacterial vaginosis and this feels similar.  Not try anything over-the-counter for symptoms.   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[place]) 01/29/2018   Alcohol use disorder, moderate, dependence ([place]) 03/17/2016   Cocaine use disorder, severe, dependence ([place]) 03/17/2016   Substance induced mood disorder ([place]) 03/17/2016   Chronic foot pain 02/07/2016   Osteoarthrosis of ankle and foot 02/07/2016   Appendicitis 03/12/2014   Major depression, recurrent, chronic ([place]) 08/20/2012    Past Surgical History:  Procedure Laterality Date   ABDOMINAL HYSTERECTOMY     APPENDECTOMY     ELEVATION OF DEPRESSED SKULL FRACTURE     FOOT ARTHRODESIS     rt and lt   FRACTURE SURGERY  2005   fx both lower legs    LAPAROSCOPIC APPENDECTOMY N/A 03/12/2014   Procedure: APPENDECTOMY LAPAROSCOPIC;  Surgeon: [name], MD;  Location: [place];  Service: General;  Laterality: N/A;   TOOTH EXTRACTION  11/27/2011    Procedure: DENTAL RESTORATION/EXTRACTIONS;  Surgeon: [name], DDS;  Location: [place];  Service: Oral Surgery;  Laterality: N/A;  just extractions    OB History   No obstetric history on file.      Home Medications    Prior to Admission medications   Medication Sig Start Date End Date Taking? Authorizing Provider  clindamycin (CLEOCIN) 300 MG capsule Take 1 capsule (300 mg total) by mouth 2 (two) times daily. 06/22/21  Yes Volney American, PA-C  acetaminophen (TYLENOL) 325 MG tablet Take 650 mg by mouth 4 (four) times daily.    [provider]  albuterol (VENTOLIN HFA) 108 (90 Base) MCG/ACT inhaler Inhale 2 puffs into the lungs every 4 (four) hours as needed for wheezing or shortness of breath. 02/19/21   Orpah Greek, MD  calcium citrate (CALCITRATE - DOSED IN MG ELEMENTAL CALCIUM) 950 MG tablet Take 200 mg of elemental calcium by mouth daily.    [provider]  carbamazepine (TEGRETOL) 200 MG tablet Take 1 tablet (200 mg total) by mouth 2 (two) times daily. 05/15/18   Mordecai Maes, NP  cholecalciferol (VITAMIN D) 25 MCG (1000 UT) tablet Take 1,000 Units by mouth daily.    [provider]  doxycycline (VIBRAMYCIN) 100 MG capsule Take 1 capsule (100 mg total) by mouth 2 (two) times daily. One po bid x 7 days 03/23/21   Ripley Fraise, MD  folic acid (FOLVITE) 1 MG  tablet Take 1 mg by mouth 2 (two) times daily.    [provider]  iron polysaccharides (NIFEREX) 150 MG capsule Take 150 mg by mouth daily.    [provider]  Multiple Vitamins-Minerals (MULTIVITAMIN ADULT) TABS Take 1 tablet by mouth daily.    [provider]  nitroGLYCERIN (NITROSTAT) 0.4 MG SL tablet Place 1 tablet (0.4 mg total) under the tongue as needed for chest pain. 05/15/18   Mordecai Maes, NP  ondansetron (ZOFRAN) 4 MG tablet Take 4 mg by mouth every 8 (eight) hours as needed for nausea or vomiting.    [provider]  polyethylene glycol (MIRALAX / GLYCOLAX) packet Take 17 g by mouth daily.    [provider]  predniSONE (DELTASONE) 10 MG tablet Take 2 tablets (20 mg total) by mouth daily. 11/21/19   Pattricia Boss, MD  QUEtiapine (SEROQUEL) 100 MG tablet Take 1 tablet (100 mg total) by mouth at bedtime. Patient taking differently: Take 200 mg by mouth at bedtime.  05/15/18   Mordecai Maes, NP  thiamine 100 MG tablet Take 1 tablet (100 mg total) by mouth daily. 05/16/18   Mordecai Maes, NP  rivaroxaban (XARELTO) 20 MG TABS tablet Take 20 mg by mouth daily with supper.  03/23/21  [provider]  sertraline (ZOLOFT) 100 MG tablet Take 1 tablet (100 mg total) by mouth daily. Patient not taking: Reported on 11/12/2018 05/16/18 03/23/21  Mordecai Maes, NP    Family History Family History  Problem Relation Age of Onset   Hyperlipidemia Son     Social History Social History   Tobacco Use   Smokeless tobacco: Never  Vaping Use   Vaping Use: Never used  Substance Use Topics   Alcohol use: Yes    Comment: states 1 beer 2 days ago.   Drug use: Not Currently    Types: Cocaine    Comment: states last used 1 year ago.     Allergies   Demerol, Latex, Penicillins, Strawberry extract, and Vicodin [hydrocodone-acetaminophen]   Review of Systems Review of Systems Per HPI  Physical Exam Triage Vital Signs ED Triage Vitals  Enc Vitals Group     BP 06/22/21 1604 (!) 141/97     Pulse Rate 06/22/21 1604 80     Resp 06/22/21 1604 17     Temp 06/22/21 1604 98.6 F (37 C)     Temp Source 06/22/21 1604 Oral     SpO2 06/22/21 1604 97 %     Weight --      Height --      Head Circumference --      Peak Flow --      Pain Score 06/22/21 1602 0     Pain Loc --      Pain Edu? --      Excl. in [place]? --    No data found.  Updated Vital Signs BP (!) 141/97 (BP Location: Left Arm)   Pulse 80   Temp 98.6 F (37 C) (Oral)   Resp 17   LMP 03/19/1985   SpO2 97%   Visual Acuity Right  Eye Distance:   Left Eye Distance:   Bilateral Distance:    Right Eye Near:   Left Eye Near:    Bilateral Near:     Physical Exam Vitals and nursing note reviewed.  Constitutional:      Appearance: Normal appearance. She is not ill-appearing.  HENT:     Head: Atraumatic.  Eyes:  Extraocular Movements: Extraocular movements intact.     Conjunctiva/sclera: Conjunctivae normal.  Cardiovascular:     Rate and Rhythm: Normal rate and regular rhythm.     Heart sounds: Normal heart sounds.  Pulmonary:     Effort: Pulmonary effort is normal.     Breath sounds: Normal breath sounds.  Musculoskeletal:        General: Normal range of motion.     Cervical back: Normal range of motion and neck supple.  Skin:    General: Skin is warm and dry.  Neurological:     Mental Status: She is alert and oriented to person, place, and time.  Psychiatric:        Mood and Affect: Mood normal.        Thought Content: Thought content normal.        Judgment: Judgment normal.   UC Treatments / Results  Labs (all labs ordered are listed, but only abnormal results are displayed) Labs Reviewed  CERVICOVAGINAL ANCILLARY ONLY   EKG  Radiology No results found.  Procedures Procedures (including critical care time)  Medications Ordered in UC Medications - No data to display  Initial Impression / Assessment and Plan / UC Course  I have reviewed the triage vital signs and the nursing notes.  Pertinent labs & imaging results that were available during my care of the patient were reviewed by me and considered in my medical decision making (see chart for details).     Suspect BV infection.  We will treat with clindamycin given her substantial alcohol use and await vaginal swab results.  Adjust as needed based on results.  Discussed good vaginal hygiene and safe sexual practices.  Final Clinical Impressions(s) / UC Diagnoses   Final diagnoses:  Vaginal discharge   Discharge Instructions    None    ED Prescriptions     Medication Sig Dispense Auth. Provider   clindamycin (CLEOCIN) 300 MG capsule Take 1 capsule (300 mg total) by mouth 2 (two) times daily. 14 capsule Volney American, [place]      PDMP not reviewed this encounter.   Volney American, [place] 06/22/21 [phone]

## 2021-06-22 NOTE — ED Triage Notes (Signed)
Pt reports vaginal discharge that is white and sometimes yellowish for over week.

## 2021-06-23 LAB — CERVICOVAGINAL ANCILLARY ONLY
Bacterial Vaginitis (gardnerella): POSITIVE — AB
Candida Glabrata: NEGATIVE
Candida Vaginitis: NEGATIVE
Chlamydia: NEGATIVE
Comment: NEGATIVE
Comment: NEGATIVE
Comment: NEGATIVE
Comment: NEGATIVE
Comment: NEGATIVE
Comment: NORMAL
Neisseria Gonorrhea: NEGATIVE
Trichomonas: NEGATIVE

## 2021-07-15 ENCOUNTER — Emergency Department (HOSPITAL_COMMUNITY): Payer: Medicare Other

## 2021-07-15 ENCOUNTER — Emergency Department (HOSPITAL_COMMUNITY)
Admission: EM | Admit: 2021-07-15 | Discharge: 2021-07-15 | Disposition: A | Discharge status: Home / Self Care | Payer: Medicare Other | Attending: Emergency Medicine | Admitting: Emergency Medicine

## 2021-07-15 ENCOUNTER — Encounter (HOSPITAL_COMMUNITY): Payer: Self-pay

## 2021-07-15 DIAGNOSIS — Z79899 Other long term (current) drug therapy: Secondary | ICD-10-CM | POA: Insufficient documentation

## 2021-07-15 DIAGNOSIS — R079 Chest pain, unspecified: Secondary | ICD-10-CM | POA: Insufficient documentation

## 2021-07-15 LAB — BASIC METABOLIC PANEL
Anion gap: 16 — ABNORMAL HIGH (ref 5–15)
BUN: 15 mg/dL (ref 8–23)
CO2: 19 mmol/L — ABNORMAL LOW (ref 22–32)
Calcium: 9.5 mg/dL (ref 8.9–10.3)
Chloride: 103 mmol/L (ref 98–111)
Creatinine, Ser: 0.68 mg/dL (ref 0.44–1.00)
GFR, Estimated: >60 mL/min (ref >60)
Glucose, Bld: 76 mg/dL (ref 70–99)
Potassium: 3.5 mmol/L (ref 3.5–5.1)
Sodium: 138 mmol/L (ref 135–145)

## 2021-07-15 LAB — CBC WITH DIFFERENTIAL/PLATELET
Abs Immature Granulocytes: 0.01 10*3/uL (ref 0.00–0.07)
Basophils Absolute: 0 10*3/uL (ref 0.0–0.1)
Basophils Relative: 1 %
Eosinophils Absolute: 0.1 10*3/uL (ref 0.0–0.5)
Eosinophils Relative: 2 %
HCT: 46.5 % — ABNORMAL HIGH (ref 36.0–46.0)
Hemoglobin: 15.5 g/dL — ABNORMAL HIGH (ref 12.0–15.0)
Immature Granulocytes: 0 %
Lymphocytes Relative: 46 %
Lymphs Abs: 2.5 10*3/uL (ref 0.7–4.0)
MCH: 31.7 pg (ref 26.0–34.0)
MCHC: 33.3 g/dL (ref 30.0–36.0)
MCV: 95.1 fL (ref 80.0–100.0)
Monocytes Absolute: 0.4 10*3/uL (ref 0.1–1.0)
Monocytes Relative: 8 %
Neutro Abs: 2.3 10*3/uL (ref 1.7–7.7)
Neutrophils Relative %: 43 %
Platelets: 226 10*3/uL (ref 150–400)
RBC: 4.89 MIL/uL (ref 3.87–5.11)
RDW: 13.4 % (ref 11.5–15.5)
WBC: 5.4 10*3/uL (ref 4.0–10.5)
nRBC: 0 % (ref 0.0–0.2)

## 2021-07-15 LAB — TROPONIN I (HIGH SENSITIVITY): Troponin I (High Sensitivity): 8 ng/L (ref <18)

## 2021-07-15 MED ORDER — IBUPROFEN 800 MG PO TABS
800.0000 mg | ORAL_TABLET | Freq: Once | ORAL | Status: AC
  Administered 2021-07-15: 800 mg via ORAL
  Filled 2021-07-15: qty 1

## 2021-07-15 MED ORDER — ACETAMINOPHEN 325 MG PO TABS: 650.0000 mg | ORAL_TABLET | Freq: Once | ORAL | Status: DC

## 2021-07-15 NOTE — ED Provider Notes (Signed)
Emergency Medicine Provider Triage Evaluation Note  Katrina Kelley , a 65 y.o. female  was evaluated in triage.  Pt complains of chest pain.  Chest pain started yesterday morning and has been constant since then.  Pain is located throughout her entire chest.  Patient describes pain as a pressure.  Pain is worse with deep inhalation and movement.  Patient denies any associated shortness of breath, nausea, vomiting, diaphoresis.  Patient received 324 mg aspirin with EMS  Review of Systems  Positive: Chest pain Negative: Nausea, vomiting, shortness of breath, leg swelling, syncope  Physical Exam  BP (!) 134/100 (BP Location: Right Arm)   Pulse 76   Temp 98.4 F (36.9 C) (Oral)   Resp 20   LMP 03/19/1985   SpO2 95%  Gen:   Awake, no distress   Resp:  Normal effort, lungs clear to auscultation bilaterally MSK:   Moves extremities without difficulty  Other:  +2 radial pulse bilaterally  Medical Decision Making  Medically screening exam initiated at 1:53 PM.  Appropriate orders placed.  Katrina Kelley was informed that the remainder of the evaluation will be completed by another provider, this initial triage assessment does not replace that evaluation, and the importance of remaining in the ED until their evaluation is complete.  The patient appears stable so that the remainder of the work up may be completed by another provider.       Katrina Beckwith, PA-C 07/15/21 1355    Katrina Hidden, MD 07/15/21 [phone]

## 2021-07-15 NOTE — ED Triage Notes (Signed)
Pt arrived via POV, c/o sternal chest pain that she states feels like her ribs are broken. Worsening with mvmt. Given 324 ASA en route.

## 2021-07-15 NOTE — ED Notes (Signed)
Pt care taken, hooked patient to the monitor. Said that she is hurting in the chest.

## 2021-07-15 NOTE — ED Notes (Signed)
Waiting for the pt to get herself situated before getting her vitals
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
## 2021-07-15 NOTE — Discharge Instructions (Signed)
[place]   [facility] [phone] Call for shelter availability.  You must have access to a phone so they can call you back.  [facility]) [place] [phone] M-F 8:00-3:00; S-S 8:00-2:00  [place] (Men and Women) [place] [phone]  [place] (Men and Women) [place] [phone]  [facility] (Domestic Violence Shelter) [place] [phone]  [facility] (Men) [place] [phone]  [place] (Single women and women with children) [address] [place] [phone]

## 2021-07-15 NOTE — ED Provider Notes (Signed)
[facility] DEPT Provider Note   CSN: [id number] Arrival date & time: 07/15/21  1235     History Chief Complaint  Patient presents with   Chest Pain    Katrina Kelley is a 65 y.o. female patient with a past medical history significant for alcohol abuse, bipolar disorder, cocaine abuse, depression, schizoaffective disorder, and current homelessness x6 years reports with 1-1/2 days of severe chest pain.  Patient reports that her chest pain started last night, is located throughout her entire chest.  Patient denies radiation to her back, neck, or left arm.  Patient denies shortness of breath, nausea, vomiting, diaphoresis.  Patient did receive 324 mg of aspirin via EMS.  Patient does report she smokes for the last 30 years, only smoking around 2 cigarettes a day at this time.  Patient denies of history of diabetes.  Patient with unknown family history of acute coronary syndrome.   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[place]) 01/29/2018   Alcohol use disorder, moderate, dependence ([place]) 03/17/2016   Cocaine use disorder, severe, dependence ([place]) 03/17/2016   Substance induced mood disorder ([place]) 03/17/2016   Chronic foot pain 02/07/2016   Osteoarthrosis of ankle and foot 02/07/2016   Appendicitis 03/12/2014   Major depression, recurrent, chronic ([place]) 08/20/2012    Past Surgical History:  Procedure Laterality Date   ABDOMINAL HYSTERECTOMY     APPENDECTOMY     ELEVATION OF DEPRESSED  SKULL FRACTURE     FOOT ARTHRODESIS     rt and lt   FRACTURE SURGERY  2005   fx both lower legs    LAPAROSCOPIC APPENDECTOMY N/A 03/12/2014   Procedure: APPENDECTOMY LAPAROSCOPIC;  Surgeon: [name], MD;  Location: [facility];  Service: General;  Laterality: N/A;   TOOTH EXTRACTION  11/27/2011   Procedure: DENTAL RESTORATION/EXTRACTIONS;  Surgeon: [name], DDS;  Location: [place];  Service: Oral Surgery;  Laterality: N/A;  just extractions     OB History   No obstetric history on file.     Family History  Problem Relation Age of Onset   Hyperlipidemia Son     Social History   Tobacco Use   Smokeless tobacco: Never  Vaping Use   Vaping Use: Never used  Substance Use Topics   Alcohol use: Yes    Comment: states 1 beer 2 days ago.   Drug use: Not Currently    Types: Cocaine    Comment: states last used 1 year ago.    Home Medications Prior to Admission medications   Medication Sig Start Date End Date Taking? Authorizing Provider  acetaminophen (TYLENOL) 325 MG tablet Take 650 mg by mouth 4 (four) times daily.    [provider]  albuterol (VENTOLIN HFA) 108 (90 Base) MCG/ACT inhaler Inhale 2 puffs into the lungs every 4 (four) hours as needed for wheezing or shortness of breath. 02/19/21   Orpah Greek, MD  calcium citrate (CALCITRATE - DOSED IN MG ELEMENTAL  CALCIUM) 950 MG tablet Take 200 mg of elemental calcium by mouth daily.    [provider]  carbamazepine (TEGRETOL) 200 MG tablet Take 1 tablet (200 mg total) by mouth 2 (two) times daily. 05/15/18   Mordecai Maes, NP  cholecalciferol (VITAMIN D) 25 MCG (1000 UT) tablet Take 1,000 Units by mouth daily.    [provider]  clindamycin (CLEOCIN) 300 MG capsule Take 1 capsule (300 mg total) by mouth 2 (two) times daily. 06/22/21   Volney American, PA-C  doxycycline (VIBRAMYCIN) 100 MG capsule Take 1 capsule (100 mg total) by mouth 2 (two) times daily. One po  bid x 7 days 03/23/21   Ripley Fraise, MD  folic acid (FOLVITE) 1 MG tablet Take 1 mg by mouth 2 (two) times daily.    [provider]  iron polysaccharides (NIFEREX) 150 MG capsule Take 150 mg by mouth daily.    [provider]  Multiple Vitamins-Minerals (MULTIVITAMIN ADULT) TABS Take 1 tablet by mouth daily.    [provider]  nitroGLYCERIN (NITROSTAT) 0.4 MG SL tablet Place 1 tablet (0.4 mg total) under the tongue as needed for chest pain. 05/15/18   Mordecai Maes, NP  ondansetron (ZOFRAN) 4 MG tablet Take 4 mg by mouth every 8 (eight) hours as needed for nausea or vomiting.    [provider]  polyethylene glycol (MIRALAX / GLYCOLAX) packet Take 17 g by mouth daily.    [provider]  predniSONE (DELTASONE) 10 MG tablet Take 2 tablets (20 mg total) by mouth daily. 11/21/19   Pattricia Boss, MD  QUEtiapine (SEROQUEL) 100 MG tablet Take 1 tablet (100 mg total) by mouth at bedtime. Patient taking differently: Take 200 mg by mouth at bedtime.  05/15/18   Mordecai Maes, NP  thiamine 100 MG tablet Take 1 tablet (100 mg total) by mouth daily. 05/16/18   Mordecai Maes, NP  rivaroxaban (XARELTO) 20 MG TABS tablet Take 20 mg by mouth daily with supper.  03/23/21  [provider]  sertraline (ZOLOFT) 100 MG tablet Take 1 tablet (100 mg total) by mouth daily. Patient not taking: Reported on 11/12/2018 05/16/18 03/23/21  Mordecai Maes, NP    Allergies    Demerol, Latex, Penicillins, Strawberry extract, and Vicodin [hydrocodone-acetaminophen]  Review of Systems   Review of Systems  Cardiovascular:  Positive for chest pain.  All other systems reviewed and are negative.  Physical Exam Updated Vital Signs BP (!) 132/94   Pulse 96   Temp 98.4 F (36.9 C) (Oral)   Resp 19   LMP 03/19/1985   SpO2 95%   Physical Exam Vitals and nursing note reviewed.  Constitutional:      General: She is not in acute distress.    Appearance: Normal  appearance.  HENT:     Head: Normocephalic and atraumatic.  Eyes:     General:        Right eye: No discharge.        Left eye: No discharge.  Cardiovascular:     Rate and Rhythm: Normal rate and regular rhythm.     Heart sounds: No murmur heard.   No friction rub. No gallop.     Comments: No tenderness to palpation of chest wall. Pulmonary:     Effort: Pulmonary effort is normal.     Breath sounds: Normal breath sounds.  Abdominal:     General: Bowel sounds are normal.     Palpations: Abdomen is soft.  Musculoskeletal:  Comments: Patient does have some tenderness to her lower legs, reports that she has had surgery on her lower legs before.  There is no evidence of asymmetric swelling, or infection.  No evidence of malfunctioning hardware.  Patient was able to ambulate without difficulty.  Skin:    General: Skin is warm and dry.     Capillary Refill: Capillary refill takes less than 2 seconds.  Neurological:     Mental Status: She is alert and oriented to person, place, and time.  Psychiatric:        Mood and Affect: Mood normal.        Behavior: Behavior normal.     Comments: Patient is frustrated during interview reports that she is already told her story 5 times.  Patient refuses further blood sticks.  Patient was cooperative after a few moments, has not agitated, thought content normal.  Alert and oriented x3.    ED Results / Procedures / Treatments   Labs (all labs ordered are listed, but only abnormal results are displayed) Labs Reviewed  BASIC METABOLIC PANEL - Abnormal; Notable for the following components:      Result Value   CO2 19 (*)    Anion gap 16 (*)    All other components within normal limits  CBC WITH DIFFERENTIAL/PLATELET - Abnormal; Notable for the following components:   Hemoglobin 15.5 (*)    HCT 46.5 (*)    All other components within normal limits  TROPONIN I (HIGH SENSITIVITY)    EKG EKG Interpretation  Date/Time:  Friday July 15 2021  13:49:21 EDT Ventricular Rate:  92 PR Interval:  163 QRS Duration: 87 QT Interval:  380 QTC Calculation: 471 R Axis:   14 Text Interpretation: Sinus rhythm Anteroseptal infarct, old No significant change since last tracing Confirmed by Calvert Cantor [phone]) on 07/15/2021 1:53:37 PM  Radiology DG Chest 2 View  Result Date: 07/15/2021 CLINICAL DATA:  Chest pain. EXAM: CHEST - 2 VIEW COMPARISON:  Chest x-ray 03/23/2021. FINDINGS: The cardiomediastinal silhouette is within normal limits. There is stable scarring in the lung apices. There is no new lung consolidation, pleural effusion or pneumothorax. Osseous structures are within normal limits. IMPRESSION: No active cardiopulmonary disease. Electronically Signed   By: Ronney Asters M.D.   On: 07/15/2021 15:18    Procedures Procedures   Medications Ordered in ED Medications  acetaminophen (TYLENOL) tablet 650 mg (has no administration in time range)  ibuprofen (ADVIL) tablet 800 mg (800 mg Oral Given 07/15/21 2026)    ED Course  I have reviewed the triage vital signs and the nursing notes.  Pertinent labs & imaging results that were available during my care of the patient were reviewed by me and considered in my medical decision making (see chart for details).    MDM Rules/Calculators/A&P                         Kelley in interview patient wants to inform me that she is homeless, is concerned about housing, would like to speak to social work.  Her cardiac work-up is without abnormality at this time.  Negative troponin x1, but in context of chest pain going on for greater than 6 hours.  BMP is significant for a mild acidemia with elevated gap.  Patient denies any recent ingestion of methanol, other medications, salicylate.  Patient is not in DKA, normal sugar.  No signs or symptoms of uremia, normal BUN.  We will continue to monitor  but minimal clinical concern for toxic ingestion at this time.  Given the large differential diagnosis for  Katrina Kelley, the decision making in this case is of high complexity.  After evaluating all of the data points in this case, the presentation of Katrina Kelley is NOT consistent with Acute Coronary Syndrome (ACS) and/or myocardial ischemia, pulmonary embolism, aortic dissection; Borhaave's, significant arrythmia, pneumothorax, cardiac tamponade, or other emergent cardiopulmonary condition.  Further, the presentation of Katrina Kelley is NOT consistent with pericarditis, myocarditis, cholecystitis, pancreatitis, mediastinitis, endocarditis, new valvular disease.  Additionally, the presentation of Katrina Kelley NOT consistent with flail chest, cardiac contusion, ARDS, or significant intra-thoracic or intra-abdominal bleeding.  Moreover, this presentation is NOT consistent with pneumonia, sepsis, or pyelonephritis.  Patient discharged in stable condition.  Patient [facility].  Return precautions were given.  Final diagnoses:  Chest pain, unspecified type    Rx / DC Orders ED Discharge Orders     None        Dorien Chihuahua 07/15/21 2044    Davonna Belling, MD 07/15/21 2340

## 2021-08-03 ENCOUNTER — Encounter: Payer: Self-pay | Admitting: *Deleted

## 2021-08-03 ENCOUNTER — Telehealth: Payer: Self-pay | Admitting: *Deleted

## 2021-08-03 NOTE — Congregational Nurse Program (Signed)
  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 Yes    Location Patient Served  [facility]    Visit Setting Church or Organization    Patient Status Homeless    Insurance [place];Medicare    Insurance Referral N/A    Medication N/A    Medical Provider No    Screening Referrals N/A    Medical Referral Other    Medical Appointment Made Other   [facility]   Food N/A    Transportation Provided transportation assistance    Housing/Utilities No permanent housing    Interpersonal Safety Do not feel safe at current residence    Intervention Support    ED Visit [place] seen in lobby with c/o foot discomfort and she reports callous on both feet. Client came to nurse's office and was given band aides. Per conversation, client has had foot surgery in the past and says she needs to see a foot doctor. Called [facility]. An appointment was made for Oct 28th at 10:30. Client has been seeing [facility] SW about housing. Arshawn Valdez W RN CN [phone]

## 2021-08-03 NOTE — Telephone Encounter (Signed)
Appt made Oct 28th at 10:30 with [facility]. Two bus passes given for transportation.

## 2021-08-12 ENCOUNTER — Ambulatory Visit: Payer: Medicare Other | Admitting: Podiatry

## 2021-10-01 ENCOUNTER — Emergency Department (HOSPITAL_COMMUNITY)
Admission: EM | Admit: 2021-10-01 | Discharge: 2021-10-02 | Disposition: A | Discharge status: Home / Self Care | Payer: Medicare Other | Attending: Emergency Medicine | Admitting: Emergency Medicine

## 2021-10-01 ENCOUNTER — Encounter (HOSPITAL_COMMUNITY): Payer: Self-pay

## 2021-10-01 DIAGNOSIS — F1414 Cocaine abuse with cocaine-induced mood disorder: Secondary | ICD-10-CM | POA: Diagnosis present

## 2021-10-01 DIAGNOSIS — F1420 Cocaine dependence, uncomplicated: Secondary | ICD-10-CM | POA: Diagnosis present

## 2021-10-01 DIAGNOSIS — N390 Urinary tract infection, site not specified: Secondary | ICD-10-CM | POA: Insufficient documentation

## 2021-10-01 DIAGNOSIS — Z7901 Long term (current) use of anticoagulants: Secondary | ICD-10-CM | POA: Diagnosis not present

## 2021-10-01 DIAGNOSIS — Z91040 Latex allergy status: Secondary | ICD-10-CM | POA: Insufficient documentation

## 2021-10-01 DIAGNOSIS — R45851 Suicidal ideations: Secondary | ICD-10-CM

## 2021-10-01 DIAGNOSIS — F1914 Other psychoactive substance abuse with psychoactive substance-induced mood disorder: Secondary | ICD-10-CM | POA: Diagnosis not present

## 2021-10-01 DIAGNOSIS — Z046 Encounter for general psychiatric examination, requested by authority: Secondary | ICD-10-CM | POA: Diagnosis present

## 2021-10-01 DIAGNOSIS — F1994 Other psychoactive substance use, unspecified with psychoactive substance-induced mood disorder: Secondary | ICD-10-CM | POA: Diagnosis not present

## 2021-10-01 LAB — BASIC METABOLIC PANEL
Anion gap: 9 (ref 5–15)
BUN: 14 mg/dL (ref 8–23)
CO2: 27 mmol/L (ref 22–32)
Calcium: 8.7 mg/dL — ABNORMAL LOW (ref 8.9–10.3)
Chloride: 102 mmol/L (ref 98–111)
Creatinine, Ser: 0.65 mg/dL (ref 0.44–1.00)
GFR, Estimated: >60 mL/min (ref >60)
Glucose, Bld: 100 mg/dL — ABNORMAL HIGH (ref 70–99)
Potassium: 3.4 mmol/L — ABNORMAL LOW (ref 3.5–5.1)
Sodium: 138 mmol/L (ref 135–145)

## 2021-10-01 LAB — RAPID URINE DRUG SCREEN, HOSP PERFORMED
Amphetamines: NOT DETECTED
Barbiturates: NOT DETECTED
Benzodiazepines: NOT DETECTED
Cocaine: POSITIVE — AB
Opiates: NOT DETECTED
Tetrahydrocannabinol: POSITIVE — AB

## 2021-10-01 LAB — URINALYSIS, ROUTINE W REFLEX MICROSCOPIC
Glucose, UA: NEGATIVE mg/dL
Ketones, ur: NEGATIVE mg/dL
Nitrite: NEGATIVE
Protein, ur: 30 mg/dL — AB
Specific Gravity, Urine: 1.025 (ref 1.005–1.030)
pH: 6 (ref 5.0–8.0)

## 2021-10-01 LAB — CBC WITH DIFFERENTIAL/PLATELET
Abs Immature Granulocytes: 0.02 10*3/uL (ref 0.00–0.07)
Basophils Absolute: 0 10*3/uL (ref 0.0–0.1)
Basophils Relative: 1 %
Eosinophils Absolute: 0 10*3/uL (ref 0.0–0.5)
Eosinophils Relative: 1 %
HCT: 41.1 % (ref 36.0–46.0)
Hemoglobin: 13.9 g/dL (ref 12.0–15.0)
Immature Granulocytes: 0 %
Lymphocytes Relative: 36 %
Lymphs Abs: 1.7 10*3/uL (ref 0.7–4.0)
MCH: 32.1 pg (ref 26.0–34.0)
MCHC: 33.8 g/dL (ref 30.0–36.0)
MCV: 94.9 fL (ref 80.0–100.0)
Monocytes Absolute: 0.5 10*3/uL (ref 0.1–1.0)
Monocytes Relative: 10 %
Neutro Abs: 2.5 10*3/uL (ref 1.7–7.7)
Neutrophils Relative %: 52 %
Platelets: 158 10*3/uL (ref 150–400)
RBC: 4.33 MIL/uL (ref 3.87–5.11)
RDW: 13.7 % (ref 11.5–15.5)
WBC: 4.8 10*3/uL (ref 4.0–10.5)
nRBC: 0 % (ref 0.0–0.2)

## 2021-10-01 LAB — SALICYLATE LEVEL: Salicylate Lvl: <7 mg/dL — ABNORMAL LOW (ref 7.0–30.0)

## 2021-10-01 LAB — ETHANOL: Alcohol, Ethyl (B): <10 mg/dL (ref <10)

## 2021-10-01 LAB — ACETAMINOPHEN LEVEL: Acetaminophen (Tylenol), Serum: <10 ug/mL — ABNORMAL LOW (ref 10–30)

## 2021-10-01 LAB — URINALYSIS, MICROSCOPIC (REFLEX): Squamous Epithelial / HPF: NONE SEEN (ref 0–5)

## 2021-10-01 MED ORDER — CEPHALEXIN 500 MG PO CAPS
500.0000 mg | ORAL_CAPSULE | Freq: Three times a day (TID) | ORAL | Status: DC
  Administered 2021-10-01 – 2021-10-02 (×2): 500 mg via ORAL
  Filled 2021-10-01 (×2): qty 1

## 2021-10-01 MED ORDER — CEPHALEXIN 500 MG PO CAPS
500.0000 mg | ORAL_CAPSULE | Freq: Once | ORAL | Status: AC
  Administered 2021-10-01: 500 mg via ORAL
  Filled 2021-10-01: qty 1

## 2021-10-01 NOTE — ED Notes (Signed)
Pt given sandwich and meal tray.

## 2021-10-01 NOTE — ED Notes (Signed)
Patient wanded by security at this time.

## 2021-10-01 NOTE — ED Notes (Signed)
Pt has 3 personal belonging's bags that are labeled and placed in cabinets:[address], [address]. Belongings include: sweatpants, t-shirt, jackets, a blanket, her socks and shoes.

## 2021-10-01 NOTE — ED Triage Notes (Signed)
Pt states she is hearing voices that are screaming and yelling at her. States she has hx of schizophrenia and has not been taking her meds. States she has been thinking about death.

## 2021-10-01 NOTE — ED Provider Notes (Signed)
[facility] DEPT Provider Note   CSN: [id number] Arrival date & time: 10/01/21  1660     History Chief Complaint  Patient presents with   Psychiatric Evaluation    Katrina Kelley is a 65 y.o. female.  65 year old female with prior medical history as detailed below presents for evaluation.  Patient reports that she is suicidal.  She initially reports that she took a handful of aspirin at a friend's house earlier this morning.  This was an apparent suicide attempt.  She is alert and cooperative.  She reports that she has not been taking her psychiatric medications.  She reports that she has been thinking more about suicide over the last several days.  She denies coingestion of other medications.  With further questioning, the patient is actually not 100% sure that what she took was aspirin.  The history is provided by the patient.  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[place])    Depression    Schizoaffective disorder [facility])     Patient Active Problem List   Diagnosis Date Noted   MDD (major depressive disorder), recurrent severe, without psychosis ([place]) 05/09/2018   Cocaine abuse with cocaine-induced mood disorder ([place]) 01/29/2018   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[place]) 01/29/2018   Alcohol use disorder, moderate, dependence ([place]) 03/17/2016   Cocaine use disorder, severe, dependence ([place]) 03/17/2016   Substance induced mood disorder ([place]) 03/17/2016   Chronic foot pain 02/07/2016   Osteoarthrosis of ankle and foot 02/07/2016   Appendicitis 03/12/2014   Major  depression, recurrent, chronic ([place]) 08/20/2012    Past Surgical History:  Procedure Laterality Date   ABDOMINAL HYSTERECTOMY     APPENDECTOMY     ELEVATION OF DEPRESSED SKULL FRACTURE     FOOT ARTHRODESIS     rt and lt   FRACTURE SURGERY  2005   fx both lower legs    LAPAROSCOPIC APPENDECTOMY N/A 03/12/2014   Procedure: APPENDECTOMY LAPAROSCOPIC;  Surgeon: [name], MD;  Location: [place];  Service: General;  Laterality: N/A;   TOOTH EXTRACTION  11/27/2011   Procedure: DENTAL RESTORATION/EXTRACTIONS;  Surgeon: [name], DDS;  Location: [place];  Service: Oral Surgery;  Laterality: N/A;  just extractions     OB History   No obstetric history on file.     Family History  Problem Relation Age of Onset   Hyperlipidemia Son     Social History   Tobacco Use   Smokeless tobacco: Never  Vaping Use   Vaping Use: Never used  Substance Use Topics   Alcohol use: Yes    Comment: states 1 beer 2 days ago.   Drug use: Not Currently    Types: Cocaine    Home Medications Prior to Admission medications   Medication Sig Start Date End Date Taking? Authorizing Provider  acetaminophen (TYLENOL) 325 MG tablet Take 650 mg by mouth 4 (four) times daily.    [provider]  albuterol (VENTOLIN HFA) 108 (90 Base) MCG/ACT inhaler Inhale 2 puffs into the lungs every 4 (four) hours as needed for wheezing or shortness  of breath. 02/19/21   Orpah Greek, MD  calcium citrate (CALCITRATE - DOSED IN MG ELEMENTAL CALCIUM) 950 MG tablet Take 200 mg of elemental calcium by mouth daily.    [provider]  carbamazepine (TEGRETOL) 200 MG tablet Take 1 tablet (200 mg total) by mouth 2 (two) times daily. 05/15/18   Mordecai Maes, NP  cholecalciferol (VITAMIN D) 25 MCG (1000 UT) tablet Take 1,000 Units by mouth daily.    [provider]  clindamycin (CLEOCIN) 300 MG capsule Take 1 capsule (300 mg total) by mouth 2 (two) times daily. 06/22/21    Volney American, PA-C  doxycycline (VIBRAMYCIN) 100 MG capsule Take 1 capsule (100 mg total) by mouth 2 (two) times daily. One po bid x 7 days 03/23/21   Ripley Fraise, MD  folic acid (FOLVITE) 1 MG tablet Take 1 mg by mouth 2 (two) times daily.    [provider]  iron polysaccharides (NIFEREX) 150 MG capsule Take 150 mg by mouth daily.    [provider]  Multiple Vitamins-Minerals (MULTIVITAMIN ADULT) TABS Take 1 tablet by mouth daily.    [provider]  nitroGLYCERIN (NITROSTAT) 0.4 MG SL tablet Place 1 tablet (0.4 mg total) under the tongue as needed for chest pain. 05/15/18   Mordecai Maes, NP  ondansetron (ZOFRAN) 4 MG tablet Take 4 mg by mouth every 8 (eight) hours as needed for nausea or vomiting.    [provider]  polyethylene glycol (MIRALAX / GLYCOLAX) packet Take 17 g by mouth daily.    [provider]  predniSONE (DELTASONE) 10 MG tablet Take 2 tablets (20 mg total) by mouth daily. 11/21/19   Pattricia Boss, MD  QUEtiapine (SEROQUEL) 100 MG tablet Take 1 tablet (100 mg total) by mouth at bedtime. Patient taking differently: Take 200 mg by mouth at bedtime.  05/15/18   Mordecai Maes, NP  thiamine 100 MG tablet Take 1 tablet (100 mg total) by mouth daily. 05/16/18   Mordecai Maes, NP  rivaroxaban (XARELTO) 20 MG TABS tablet Take 20 mg by mouth daily with supper.  03/23/21  [provider]  sertraline (ZOLOFT) 100 MG tablet Take 1 tablet (100 mg total) by mouth daily. Patient not taking: Reported on 11/12/2018 05/16/18 03/23/21  Mordecai Maes, NP    Allergies    Demerol, Latex, Penicillins, Strawberry extract, and Vicodin [hydrocodone-acetaminophen]  Review of Systems   Review of Systems  All other systems reviewed and are negative.  Physical Exam Updated Vital Signs BP (!) 121/98 (BP Location: Right Arm)    Pulse 94    Temp 98.1 F (36.7 C)    Resp 18    LMP 03/19/1985    SpO2 95%   Physical Exam Vitals and  nursing note reviewed.  Constitutional:      General: She is not in acute distress.    Appearance: Normal appearance. She is well-developed.  HENT:     Head: Normocephalic and atraumatic.  Eyes:     Conjunctiva/sclera: Conjunctivae normal.     Pupils: Pupils are equal, round, and reactive to light.  Cardiovascular:     Rate and Rhythm: Normal rate and regular rhythm.     Heart sounds: Normal heart sounds.  Pulmonary:     Effort: Pulmonary effort is normal. No respiratory distress.     Breath sounds: Normal breath sounds.  Abdominal:     General: There is no distension.     Palpations: Abdomen is soft.     Tenderness:  There is no abdominal tenderness.  Musculoskeletal:        General: No deformity. Normal range of motion.     Cervical back: Normal range of motion and neck supple.  Skin:    General: Skin is warm and dry.  Neurological:     General: No focal deficit present.     Mental Status: She is alert and oriented to person, place, and time.  Psychiatric:     Comments: Endorses SI with plan to "overdose"    ED Results / Procedures / Treatments   Labs (all labs ordered are listed, but only abnormal results are displayed) Labs Reviewed  BASIC METABOLIC PANEL - Abnormal; Notable for the following components:      Result Value   Potassium 3.4 (*)    Glucose, Bld 100 (*)    Calcium 8.7 (*)    All other components within normal limits  RAPID URINE DRUG SCREEN, HOSP PERFORMED - Abnormal; Notable for the following components:   Cocaine POSITIVE (*)    Tetrahydrocannabinol POSITIVE (*)    All other components within normal limits  URINALYSIS, ROUTINE W REFLEX MICROSCOPIC - Abnormal; Notable for the following components:   Hgb urine dipstick LARGE (*)    Bilirubin Urine SMALL (*)    Protein, ur 30 (*)    Leukocytes,Ua MODERATE (*)    All other components within normal limits  ACETAMINOPHEN LEVEL - Abnormal; Notable for the following components:   Acetaminophen (Tylenol),  Serum <10 (*)    All other components within normal limits  SALICYLATE LEVEL - Abnormal; Notable for the following components:   Salicylate Lvl <6.7 (*)    All other components within normal limits  URINALYSIS, MICROSCOPIC (REFLEX) - Abnormal; Notable for the following components:   Bacteria, UA FEW (*)    All other components within normal limits  URINE CULTURE  CBC WITH DIFFERENTIAL/PLATELET  ETHANOL    EKG EKG Interpretation  Date/Time:  Saturday October 01 2021 08:27:35 EST Ventricular Rate:  92 PR Interval:  156 QRS Duration: 79 QT Interval:  386 QTC Calculation: 478 R Axis:   44 Text Interpretation: Sinus rhythm Anteroseptal infarct, old Confirmed by Dene Gentry [phone]) on 10/01/2021 10:18:12 AM  Radiology No results found.  Procedures Procedures   Medications Ordered in ED Medications  cephALEXin (KEFLEX) capsule 500 mg (500 mg Oral Given 10/01/21 1030)    ED Course  I have reviewed the triage vital signs and the nursing notes.  Pertinent labs & imaging results that were available during my care of the patient were reviewed by me and considered in my medical decision making (see chart for details).    MDM Rules/Calculators/A&P                         MDM  MSE complete  NIMSI MALES was evaluated in Emergency Department on 10/01/2021 for the symptoms described in the history of present illness. She was evaluated in the context of the global COVID-19 pandemic, which necessitated consideration that the patient might be at risk for infection with the SARS-CoV-2 virus that causes COVID-19. Institutional protocols and algorithms that pertain to the evaluation of patients at risk for COVID-19 are in a state of rapid change based on information released by regulatory bodies including the CDC and federal and state organizations. These policies and algorithms were followed during the patient's care in the ED.  Patient is presenting with complaint of suicidal  ideation.  She reports  possible OD attempt early this morning.  Exact medication involved in her attempted OD as unclear.  Patient is alert, cooperative, comfortable during exam.  She is without evidence of toxidrome on exam.  Screening labs obtained especially aspirin and serum levels are normal.  Patient with evidence of UTI on UA.  She does report some dysuria.  We will treat for same.  Patient is medically clear at this time for psychiatric eval and treatment.      Final Clinical Impression(s) / ED Diagnoses Final diagnoses:  Suicidal ideation  Urinary tract infection without hematuria, site unspecified    Rx / DC Orders ED Discharge Orders     None        Valarie Merino, MD 10/01/21 1439

## 2021-10-01 NOTE — ED Notes (Signed)
3 bags of belongings secured

## 2021-10-01 NOTE — ED Notes (Signed)
Pt currently changing into burgundy scrubs
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
## 2021-10-02 DIAGNOSIS — F1994 Other psychoactive substance use, unspecified with psychoactive substance-induced mood disorder: Secondary | ICD-10-CM

## 2021-10-02 DIAGNOSIS — F1914 Other psychoactive substance abuse with psychoactive substance-induced mood disorder: Secondary | ICD-10-CM | POA: Diagnosis not present

## 2021-10-02 MED ORDER — CEPHALEXIN 500 MG PO CAPS: 500.0000 mg | ORAL_CAPSULE | Freq: Two times a day (BID) | ORAL | 0 refills | Status: AC
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
## 2021-10-02 NOTE — Discharge Instructions (Addendum)
Shelter List:   [place] ([place]) [place], [place] Phone: [place] has been providing emergency shelter to those in need of a permanent residence for over 35 years. The [facility] shelter plays an important role in our community.   There are many life events that can pull someone into a downward spiral towards poverty that is very difficult to get out of. Homelessness is a problem that can affect anyone of [place]. [facility] is a safe and comforting place to stay, especially if you have experienced the hardship of street life.   [facility] provides a single bed and bedding to 100 adult men and women. The shelter welcomes all who are in need of housing, no one in real need is turned away unless space is not available.   While staying at [facility], guests are offered more than just a bed for a night. Hot meals are provided and every guest has access to case management services. Case managers provide assistance with finding housing, employment, or other services that will help them gain stability. Continuous stay is based on availability, capacity, and progress towards goals.   To contact the front desk of [facility] please call   [phone] ext [phone] or ext. [phone].   [facility] [address], [place], [place] [address] Phone: [phone]   [facility] (Women only) [address] [place], [place] [address] Phone: [address] [place], [place] [address] Phone: [phone]   [place]: [phone]. [address] [place], [place] [address] Phone: [phone]   [facility] [address], [place], [place] [address] Check in at 6:00PM for placement at a local shelter) Phone: [phone]     [facility]          -(Please Call) Phone:  [phone]   [facility] Eligibility:  Must be drug and alcohol free for at least 14 days or more at the time of application. This program serves males.  Houses Architectural technologist, Futures trader who serve six-month terms.  Houses are financially self-supporting; members split house expenses, which average $90.00 to $130.00 per person per week.  Any Resident who relapses must be immediately expelled. Call:  [facility]-will provide timely access to mental health services for children and adolescents (4-17) and adults presenting in a mental health crisis. The program is designed for those who need urgent Behavioral Health or Substance Use treatment and are not experiencing a medical crisis that would typically require an emergency room visit.    [place],  [address] Phone: [phone] [email or web address]   [facility] will also offer the following outpatient services: (Monday through Friday 8am-5pm)   Partial Hospitalization Program (PHP) Substance Abuse Intensive Outpatient Program (SA-IOP) Group Therapy Medication Management Peer Living Room   We also provide (24/7):    Assessments: Our mental health clinician and providers will conduct a focused mental health evaluation, assessing for immediate safety concerns and further mental health needs.   Referral: Our team will provide resources and help connect to community based mental health treatment, when indicated, including psychotherapy, psychiatry, and other specialized behavioral health or substance use disorder services (for those not already in treatment).   Transitional Care: Our team providers in person bridging and/or telphonic follow-up during  the patient's transition to outpatient services.

## 2021-10-02 NOTE — ED Notes (Addendum)
Attempted to give D/C papers and bus pass, pt refused. Given bus pass to security, [employer] at bedside. Refused d/c vitals.

## 2021-10-02 NOTE — ED Provider Notes (Signed)
Patient has been cleared by behavioral health for discharge.  Her vital signs are stable.  I will give her a prescription for Keflex to continue treatment for her UTI.  TOC has seen the patient and offered shelter resources.  Return precautions given.   Malvin Johns, MD 10/02/21 1102

## 2021-10-02 NOTE — Progress Notes (Signed)
CSW provided the following referrals for the patient to [facility] upon discharge:   Shelter List:   [place] ([place]) [place], [place] Phone: [place] has been providing emergency shelter to those in need of a permanent residence for over 35 years. The [facility] shelter plays an important role in our community.   There are many life events that can pull someone into a downward spiral towards poverty that is very difficult to get out of. Homelessness is a problem that can affect anyone of [place]. [facility] is a safe and comforting place to stay, especially if you have experienced the hardship of street life.   [facility] provides a single bed and bedding to 100 adult men and women. The shelter welcomes all who are in need of housing, no one in real need is turned away unless space is not available.   While staying at [facility], guests are offered more than just a bed for a night. Hot meals are provided and every guest has access to case management services. Case managers provide assistance with finding housing, employment, or other services that will help them gain stability. Continuous stay is based on availability, capacity, and progress towards goals.   To contact the front desk of [facility] please call   [phone] ext [phone] or ext. [phone].   [facility] [address] [place], [place] [address] Phone: [phone]   [facility] (Women only) [address] [place], [place] [address] Phone: [address]. [place], [place] [address] Phone: [phone]   [place]: [phone]. [address] [place], [place] [address] Phone: [phone]   [facility] [address] [place], [place] [address] Check in at 6:00PM for placement at a local shelter) Phone:  [phone]     [facility]          -(Please Call) Phone: [phone]   [facility] Eligibility:  Must be drug and alcohol free for at least 14 days or more at the time of application. This program serves males.  Houses Architectural technologist, Futures trader who serve six-month terms.  Houses are financially self-supporting; members split house expenses, which average $90.00 to $130.00 per person per week.  Any Resident who relapses must be immediately expelled. Call:  [facility]-will provide timely access to mental health services for children and adolescents (4-17) and adults presenting in a mental health crisis. The program is designed for those who need urgent Behavioral Health or Substance Use treatment and are not experiencing a medical crisis that would typically require an emergency room visit.    [place],  [address] Phone: [phone] [email or web address]   [facility] will also offer the following outpatient services: (Monday through Friday 8am-5pm)   Partial Hospitalization Program (PHP) Substance Abuse Intensive Outpatient Program (SA-IOP) Group Therapy Medication Management Peer Living Room   We also provide (24/7):    Assessments: Our mental health clinician and providers will conduct a focused mental health evaluation, assessing for immediate safety concerns and further mental health needs.   Referral: Our team will provide resources and help connect to community based mental health treatment, when indicated, including psychotherapy, psychiatry, and other specialized behavioral health or substance use disorder services (for those not already in treatment).  Transitional Care: Our team providers in person bridging and/or telphonic follow-up during the patient's transition to outpatient services.      Glennie Isle, MSW, [place], LCAS-A Phone:  [phone] Disposition/TOC

## 2021-10-02 NOTE — ED Notes (Signed)
Pt aggravated with when tech getting vitals. Pt wants to talk to Education officer, museum.

## 2021-10-02 NOTE — ED Notes (Signed)
Breakfast provided.

## 2021-10-02 NOTE — Progress Notes (Signed)
TOC CSW was consulted to provider assistance .Pt already has shelter list attached to AVS, pt can d/c to [facility] for further assistance with shelters. TOC CSW sign off at this time.  Katrina Kelley.Faraz Ponciano, MSW, [place]   Transitions of Care Clinical Social Worker I Direct Dial: [phone]   Fax: [phone] [email or web address]

## 2021-10-02 NOTE — ED Notes (Signed)
TTS machine at bedside. 
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
## 2021-10-02 NOTE — ED Provider Notes (Signed)
Emergency Medicine Observation Re-evaluation Note  Katrina Kelley is a 65 y.o. female, seen on rounds today.  Pt initially presented to the ED for complaints of Psychiatric Evaluation Currently, the patient is sleeping.  Physical Exam  BP (!) 129/99    Pulse 88    Temp 98 F (36.7 C) (Oral)    Resp 20    LMP 03/19/1985    SpO2 96%  Physical Exam General: sleeping Cardiac: normal rate Lungs: no increased WOB Psych: calm  ED Course / MDM  EKG:EKG Interpretation  Date/Time:  Saturday October 01 2021 08:27:35 EST Ventricular Rate:  92 PR Interval:  156 QRS Duration: 79 QT Interval:  386 QTC Calculation: 478 R Axis:   44 Text Interpretation: Sinus rhythm Anteroseptal infarct, old Confirmed by Dene Gentry [phone]) on 10/01/2021 10:18:12 AM  I have reviewed the labs performed to date as well as medications administered while in observation.  Recent changes in the last 24 hours include none.  Plan  Current plan is for still being monitored by [facility] team.  Is on keflex for UTI attempting to have her medications reconciled so that they can be restarted.Katrina Kelley is not under involuntary commitment.     Katrina Johns, MD 10/02/21 0730

## 2021-10-03 ENCOUNTER — Encounter: Payer: Self-pay | Admitting: *Deleted

## 2021-10-03 LAB — URINE CULTURE: Culture: >=100000 — AB
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
## 2021-10-04 ENCOUNTER — Telehealth (HOSPITAL_BASED_OUTPATIENT_CLINIC_OR_DEPARTMENT_OTHER): Payer: Self-pay | Admitting: *Deleted

## 2021-10-04 NOTE — Telephone Encounter (Signed)
Post ED Visit - Positive Culture Follow-up  Culture report reviewed by antimicrobial stewardship pharmacist: [place] Team []  Elenor Quinones, Pharm.D. []  Heide Guile, Pharm.D., BCPS AQ-ID []  Parks Neptune, Pharm.D., BCPS []  Alycia Rossetti, Pharm.D., BCPS []  [place], Pharm.D., BCPS, AAHIVP []  Legrand Como, Pharm.D., BCPS, AAHIVP []  Salome Arnt, PharmD, BCPS []  Johnnette Gourd, PharmD, BCPS []  Hughes Better, PharmD, BCPS []  Leeroy Cha, PharmD []  Laqueta Linden, PharmD, BCPS []  Albertina Parr, PharmD  [place] Team []  Leodis Sias, PharmD []  Lindell Spar, PharmD []  Royetta Asal, PharmD []  Graylin Shiver, Rph []  Rema Fendt) Glennon Mac, PharmD []  Arlyn Dunning, PharmD []  Netta Cedars, PharmD []  Dia Sitter, PharmD []  Leone Haven, PharmD []  Gretta Arab, PharmD []  Theodis Shove, PharmD []  Peggyann Juba, PharmD [x]  Jimmy Footman, PharmD   Positive urine culture Treated with Cephalexin, organism sensitive to the same and no further patient follow-up is required at this time.  Rosie Fate 10/04/2021, 8:20 AM

## 2023-12-15 DEATH — deceased
